# Patient Record
Sex: Female | Born: 1945 | Race: White | Hispanic: No | Marital: Single | Smoking: Never smoker
Health system: Southern US, Community
[De-identification: ages and names within clinical notes are randomized; demographics above are authoritative.]

## PROBLEM LIST (undated history)

## (undated) DIAGNOSIS — E079 Disorder of thyroid, unspecified: Secondary | ICD-10-CM

## (undated) DIAGNOSIS — T7840XA Allergy, unspecified, initial encounter: Secondary | ICD-10-CM

## (undated) DIAGNOSIS — E785 Hyperlipidemia, unspecified: Secondary | ICD-10-CM

## (undated) DIAGNOSIS — E559 Vitamin D deficiency, unspecified: Secondary | ICD-10-CM

## (undated) DIAGNOSIS — I10 Essential (primary) hypertension: Secondary | ICD-10-CM

## (undated) DIAGNOSIS — H269 Unspecified cataract: Secondary | ICD-10-CM

## (undated) DIAGNOSIS — J310 Chronic rhinitis: Secondary | ICD-10-CM

## (undated) DIAGNOSIS — K635 Polyp of colon: Secondary | ICD-10-CM

## (undated) DIAGNOSIS — J45909 Unspecified asthma, uncomplicated: Secondary | ICD-10-CM

## (undated) DIAGNOSIS — N951 Menopausal and female climacteric states: Secondary | ICD-10-CM

## (undated) DIAGNOSIS — M8580 Other specified disorders of bone density and structure, unspecified site: Secondary | ICD-10-CM

## (undated) DIAGNOSIS — K219 Gastro-esophageal reflux disease without esophagitis: Secondary | ICD-10-CM

## (undated) HISTORY — DX: Menopausal and female climacteric states: N95.1

## (undated) HISTORY — DX: Unspecified cataract: H26.9

## (undated) HISTORY — DX: Unspecified asthma, uncomplicated: J45.909

## (undated) HISTORY — PX: COLONOSCOPY: SHX174

## (undated) HISTORY — DX: Disorder of thyroid, unspecified: E07.9

## (undated) HISTORY — DX: Essential (primary) hypertension: I10

## (undated) HISTORY — DX: Chronic rhinitis: J31.0

## (undated) HISTORY — DX: Vitamin D deficiency, unspecified: E55.9

## (undated) HISTORY — DX: Other specified disorders of bone density and structure, unspecified site: M85.80

## (undated) HISTORY — DX: Polyp of colon: K63.5

## (undated) HISTORY — DX: Hyperlipidemia, unspecified: E78.5

## (undated) HISTORY — PX: TONSILLECTOMY: SUR1361

## (undated) HISTORY — DX: Allergy, unspecified, initial encounter: T78.40XA

## (undated) HISTORY — DX: Gastro-esophageal reflux disease without esophagitis: K21.9

---

## 1998-07-18 ENCOUNTER — Other Ambulatory Visit: Admission: RE | Admit: 1998-07-18 | Discharge: 1998-07-18 | Payer: Self-pay | Admitting: Family Medicine

## 2000-07-30 ENCOUNTER — Other Ambulatory Visit: Admission: RE | Admit: 2000-07-30 | Discharge: 2000-07-30 | Payer: Self-pay | Admitting: Family Medicine

## 2001-08-12 ENCOUNTER — Other Ambulatory Visit: Admission: RE | Admit: 2001-08-12 | Discharge: 2001-08-12 | Payer: Self-pay | Admitting: Family Medicine

## 2002-09-06 ENCOUNTER — Other Ambulatory Visit: Admission: RE | Admit: 2002-09-06 | Discharge: 2002-09-06 | Payer: Self-pay | Admitting: Family Medicine

## 2003-08-26 HISTORY — PX: CHOLECYSTECTOMY: SHX55

## 2003-10-12 ENCOUNTER — Other Ambulatory Visit: Admission: RE | Admit: 2003-10-12 | Discharge: 2003-10-12 | Payer: Self-pay | Admitting: Family Medicine

## 2004-04-05 ENCOUNTER — Inpatient Hospital Stay (HOSPITAL_COMMUNITY): Admission: EM | Admit: 2004-04-05 | Discharge: 2004-05-03 | Payer: Self-pay | Admitting: Internal Medicine

## 2004-04-09 ENCOUNTER — Encounter (INDEPENDENT_AMBULATORY_CARE_PROVIDER_SITE_OTHER): Payer: Self-pay | Admitting: Specialist

## 2004-05-14 ENCOUNTER — Ambulatory Visit (HOSPITAL_COMMUNITY): Admission: RE | Admit: 2004-05-14 | Discharge: 2004-05-14 | Payer: Self-pay | Admitting: General Surgery

## 2004-05-27 ENCOUNTER — Ambulatory Visit (HOSPITAL_COMMUNITY): Admission: RE | Admit: 2004-05-27 | Discharge: 2004-05-27 | Payer: Self-pay | Admitting: General Surgery

## 2004-05-30 ENCOUNTER — Ambulatory Visit (HOSPITAL_COMMUNITY): Admission: RE | Admit: 2004-05-30 | Discharge: 2004-05-30 | Payer: Self-pay | Admitting: Internal Medicine

## 2005-04-25 IMAGING — CT CT ABDOMEN W/O CM
1 of 2 series · 15 of 32 positions shown, 19 images · non-contrast
Comparison: None.

CLINICAL DATA: Abdominal pain and gallstones.  Assess for abdominal abscess.
ABDOMEN AND PELVIS CT WITHOUT CONTRAST ? 04/19/04
TECHNIQUE: Contiguous 5 mm axial images were obtained from the lung bases through the pubic symphysis after spiral CT scanning of the abdomen and pelvis.  Intravenous contrast was withheld secondary to the patient?s elevated serum creatinine.

[Series 3: abd/pelvis 5.0 b30f · axial · 0.64mm/px · z∈[+1326,+1750]mm · 15 of 93 slices shown, 19 images]
[im 4/93  soft-tissue]
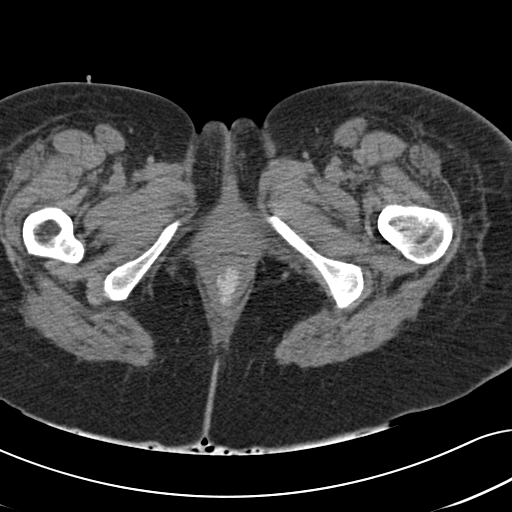
[im 4/93  bone]
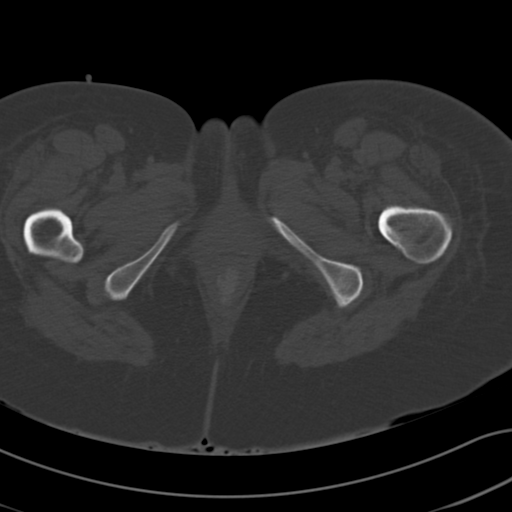
[im 12/93  soft-tissue]
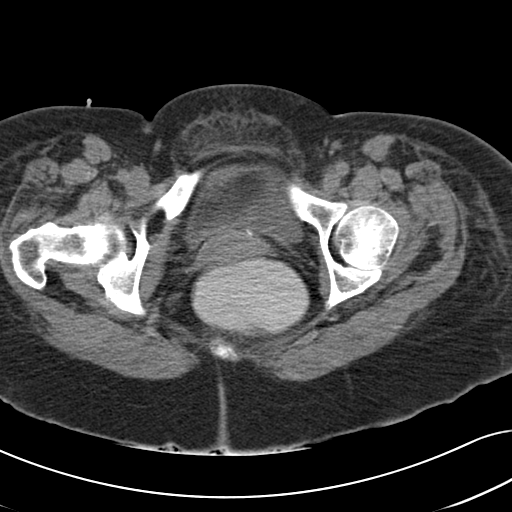
[im 19/93  soft-tissue]
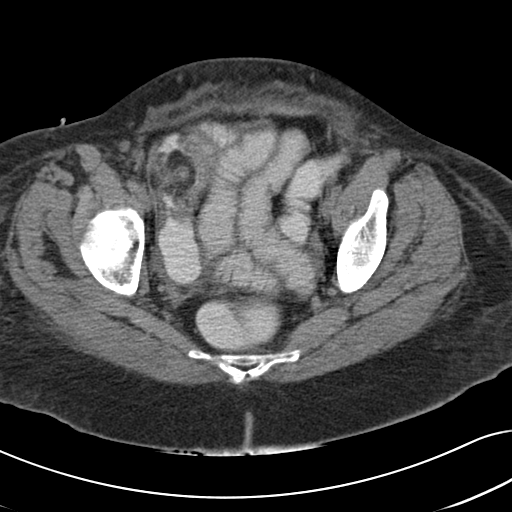
[im 26/93  soft-tissue]
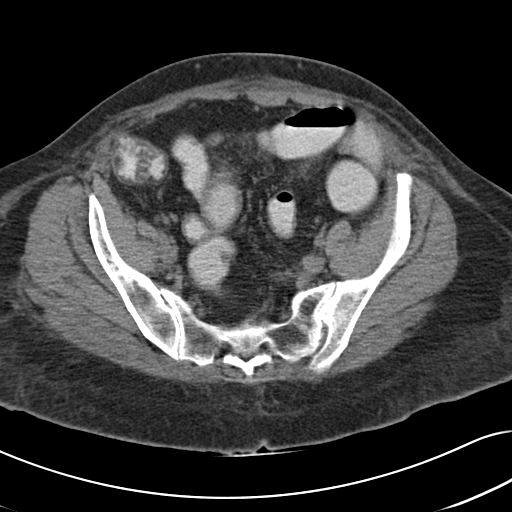
[im 34/93  soft-tissue]
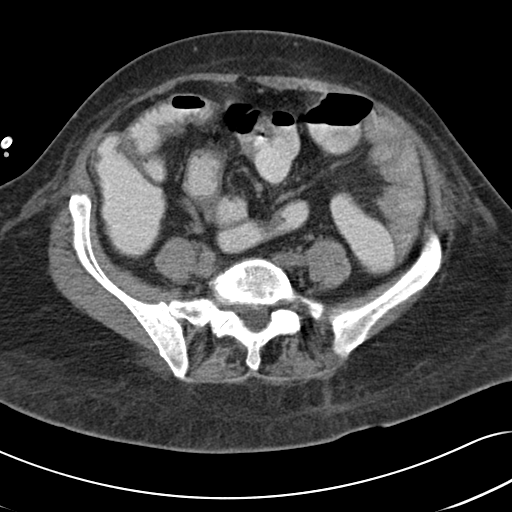
[im 41/93  soft-tissue]
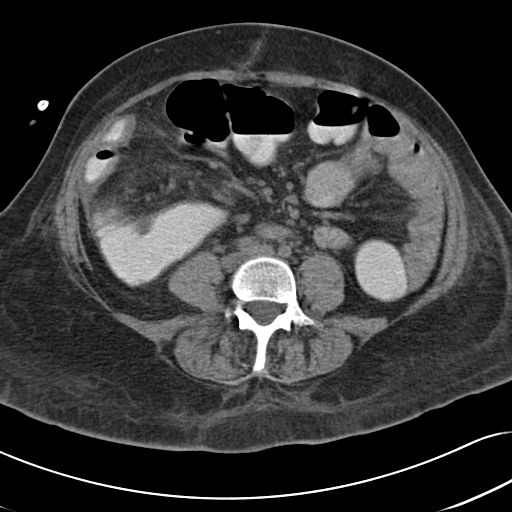
[im 48/93  soft-tissue]
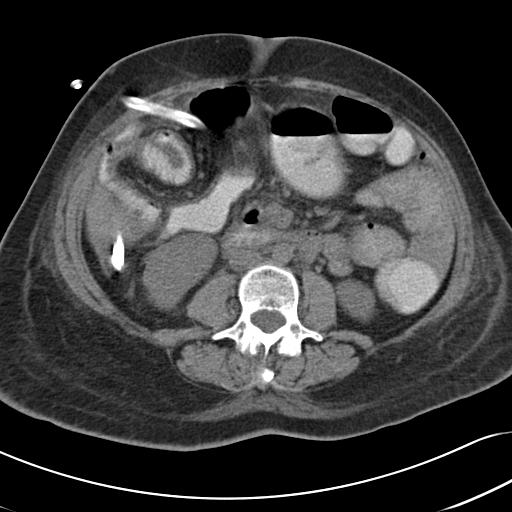
[im 52/93  soft-tissue]
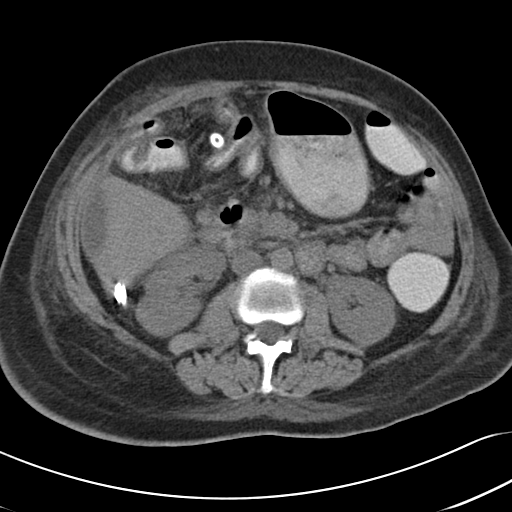
[im 59/93  soft-tissue]
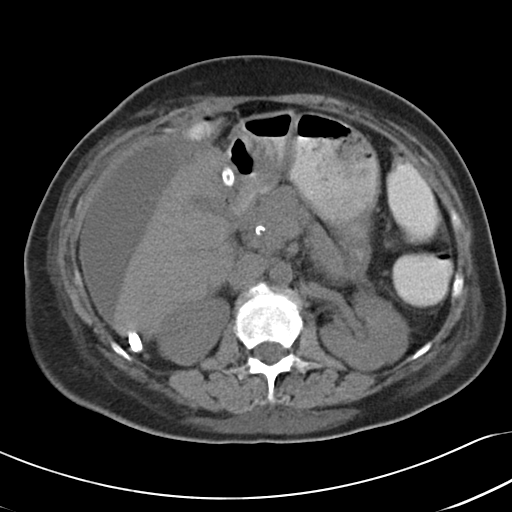
[im 59/93  bone]
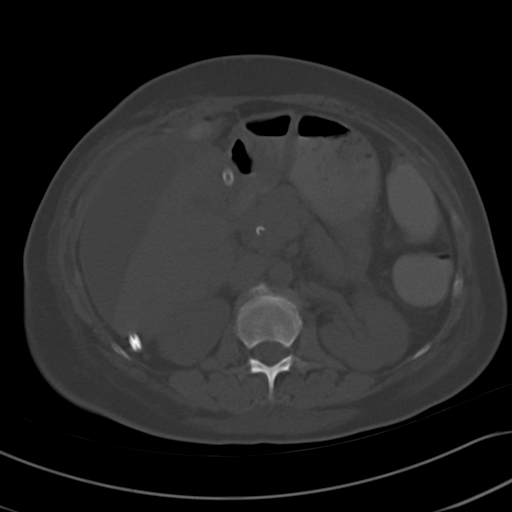
[im 67/93  soft-tissue]
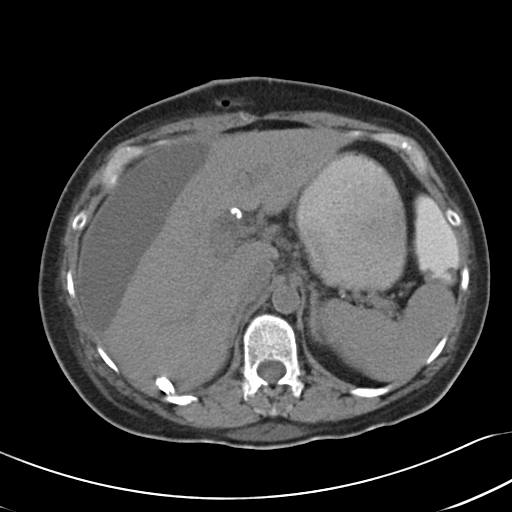
[im 74/93  soft-tissue]
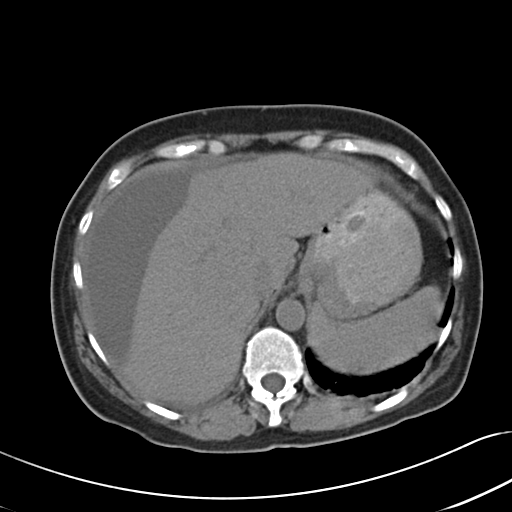
[im 78/93  lung]
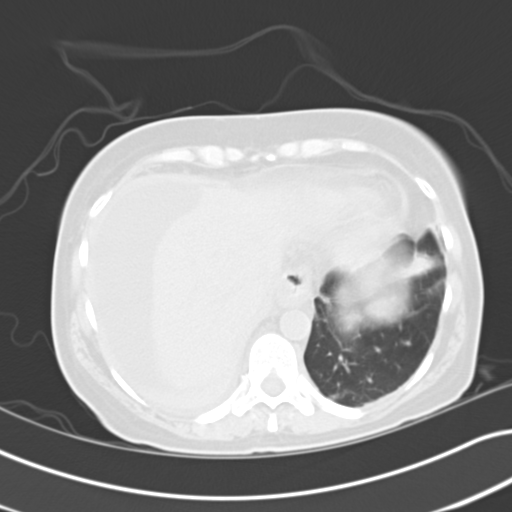
[im 81/93  soft-tissue]
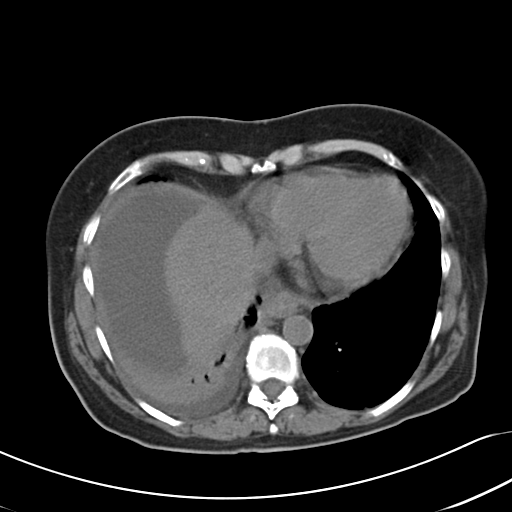
[im 81/93  lung]
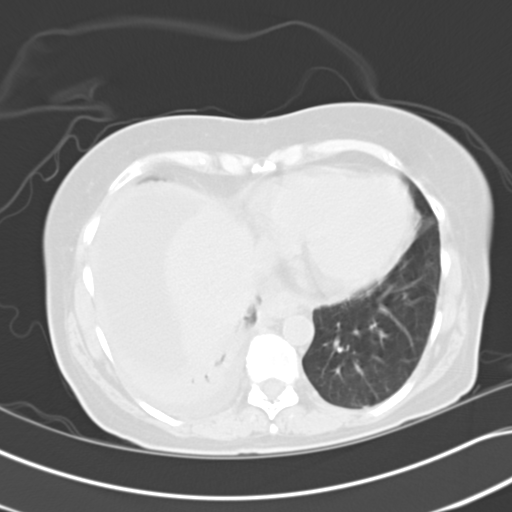
[im 85/93  lung]
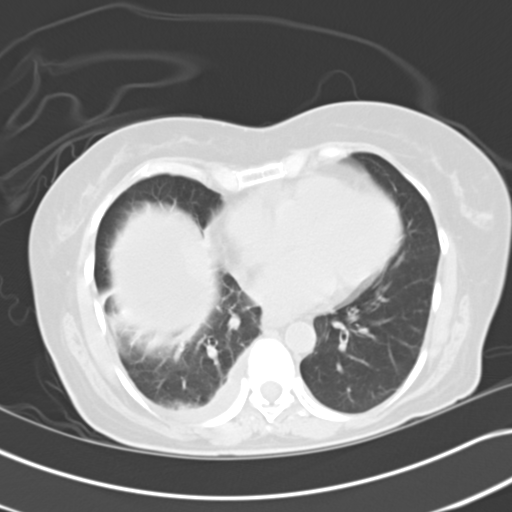
[im 89/93  soft-tissue]
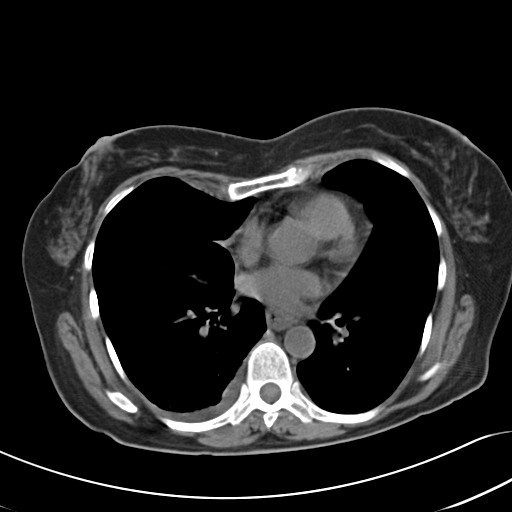
[im 89/93  lung]
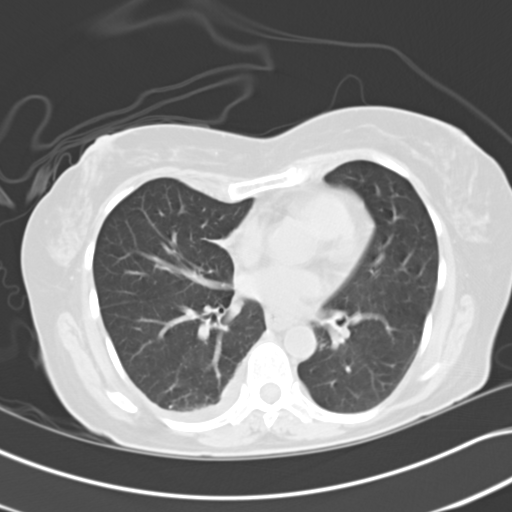

[15 of 32 positions shown; findings below may reference images not displayed]

FINDINGS: ABDOMEN:
Incidental imaging of the lung bases shows compressive atelectasis and a small effusion in the posterior right lung base.
A moderate sized crescent-shaped fluid collection is identified adjacent to the right liver, most likely subcapsular.  This measures approximately 14.0 x 3.7 x 17 cm.  By CT the attenuation is in the 6 to 8 Hounsfield unit range, and there is no heterogeneity to suggest a subcapsular hematoma.  A biloma would be a distinct consideration given the patient?s recent history of open cholecystectomy and operative T-tube placement.  
No focal abnormality is seen in the liver or spleen on this uninfused examination.  The stomach is unremarkable.  A prominent duodenal diverticulum is noted.  The pancreas, adrenal glands, and kidneys have normal uninfused features.  Surgical clips are noted in the gallbladder fossa.  There is no discrete fluid collection in the gallbladder fossa itself.
Small bowel loops are noted cranial to the distal stomach and hepatic flexure and an internal hernia cannot be completely excluded.  There is no free intraperitoneal fluid identified in the abdomen.  A T-tube is noted and a surgical drain courses under the liver and along the posterior margin. 
IMPRESSION
Large subcapsular fluid collection adjacent to the liver worrisome for a biloma.
PELVIS:
Imaging through the anatomic pelvis shows a small amount of free fluid.  There is fatty infiltration of the terminal ileum.  No substantial colonic wall thickening is apparent.  Diffuse fluid throughout the course of the colon suggests diarrhea.  There is no evidence for adnexal masses.
IMPRESSION
Small amount of intraperitoneal fluid.  No acute findings in the anatomic pelvis.

## 2005-04-26 IMAGING — CT CT ABCESS DRAINAGE
1 of 3 series · 15 of 32 positions shown, 19 images · non-contrast
Comparison: none

CLINICAL DATA: 57-year-old female status post open cholecystectomy wit a right hepatic subcapsular fluid collection.    
 CT GUIDED RIGHT SUBCAPSULAR BILOMA DRAINAGE
 GUIDANCE:  CT.
 COMPLICATIONS:  No immediate complications.
 PROCEDURE/FINDINGS:  Informed consent was obtained from the patient.  Non-contrast CT imaging was performed through the lower abdomen.  The subcapsular fluid collection was identified.  From an anterior lateral subcostal approach, an 18 gauge introducer needle was advanced into the subcapsular fluid collection.  There was return of bile.  An Amplatz guide wire was advanced followed by dilatation to except a 10 French pigtail drain.  Drainage catheter position was confirmed with follow-up imaging.  400 cc of bilious fluid was aspirated.  Catheter was secured to the skin with a 0 silk suture.  Dry sterile dressing was applied.  
 There were no immediate complications.  The patient tolerated the procedure well.
 IMPRESSION
 CT guided right subcapsular biloma drainage procedure with insertion of a 10 French pigtail drain.
 PLAN:  
 The patient will remain on IV antibiotics with external drainage.

[Series 3: — · axial · 0.77mm/px · z∈[-227,+8]mm · 15 of 56 slices shown, 19 images]
[im 5/56  soft-tissue]
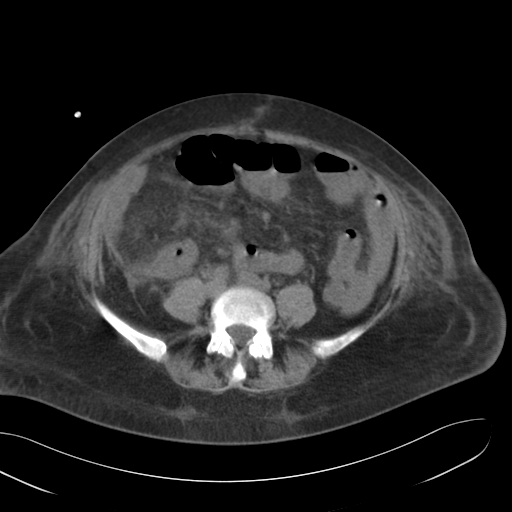
[im 5/56  bone]
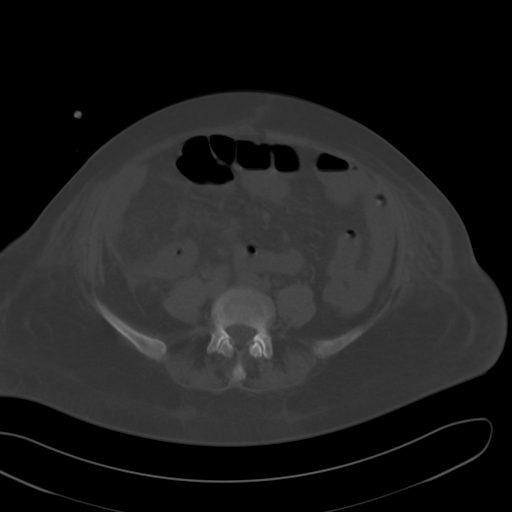
[im 9/56  soft-tissue]
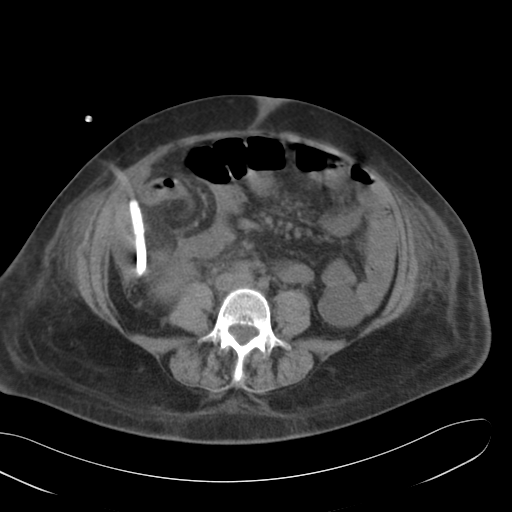
[im 13/56  soft-tissue]
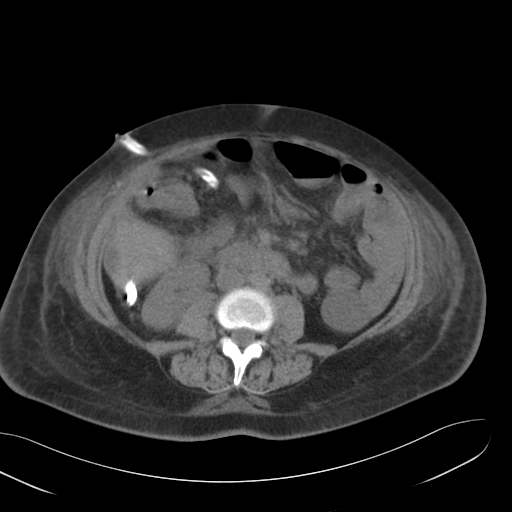
[im 17/56  soft-tissue]
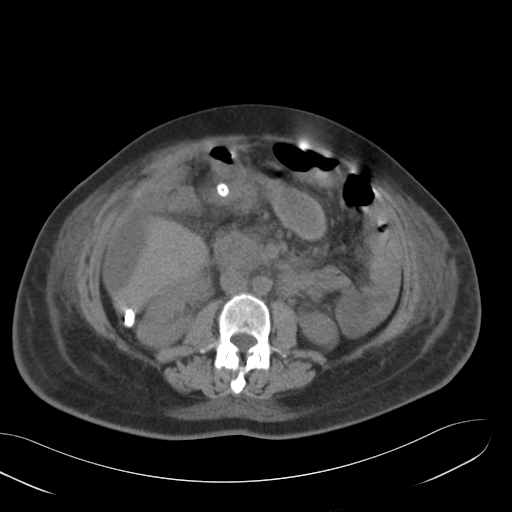
[im 21/56  soft-tissue]
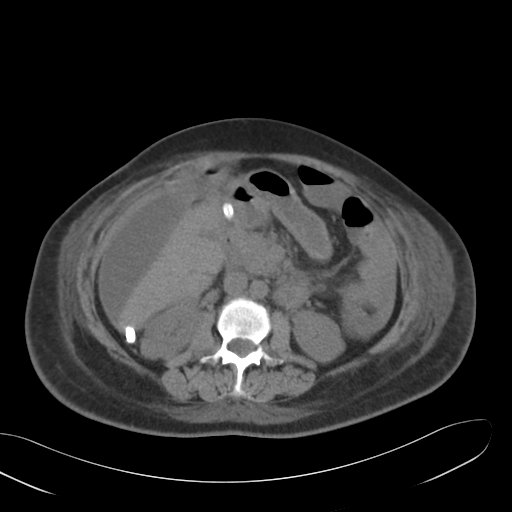
[im 25/56  soft-tissue]
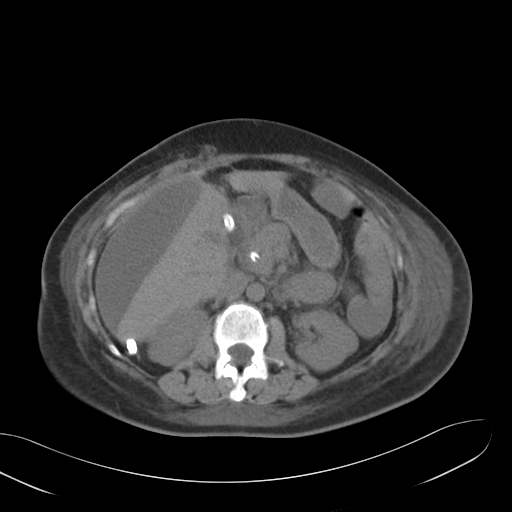
[im 29/56  soft-tissue]
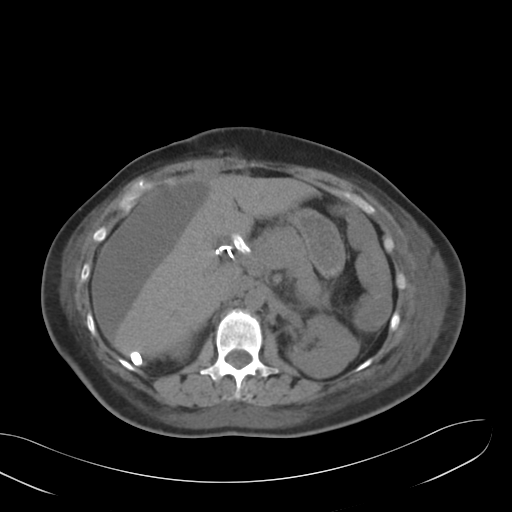
[im 33/56  soft-tissue]
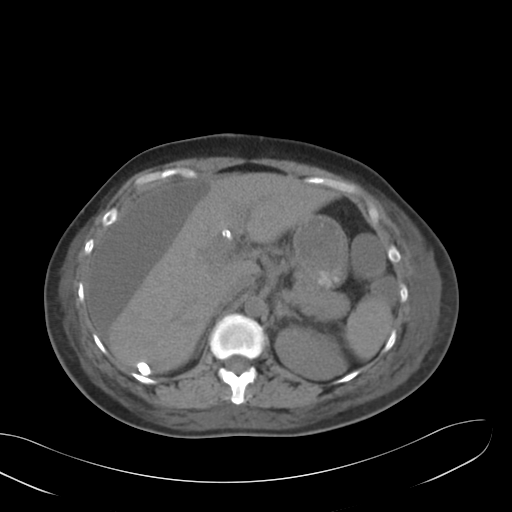
[im 37/56  soft-tissue]
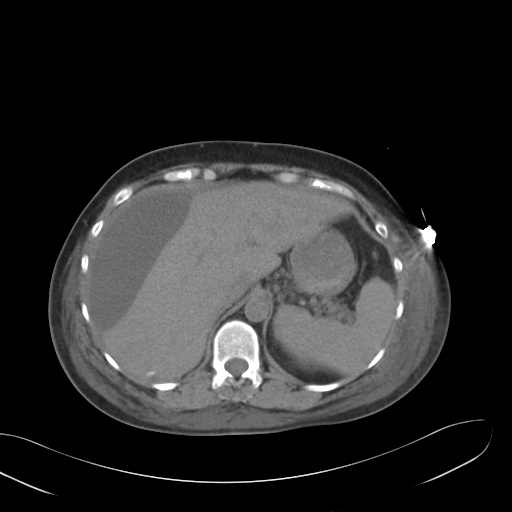
[im 37/56  bone]
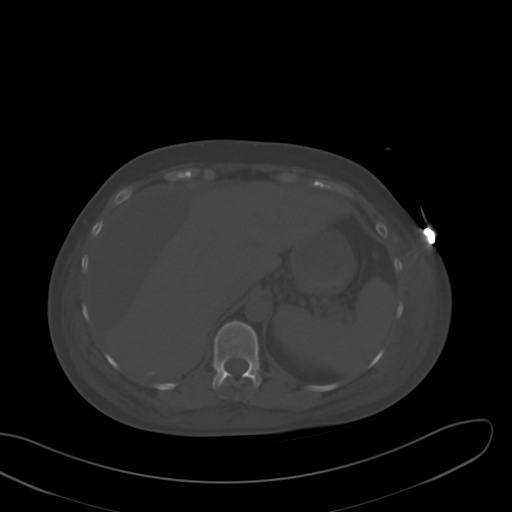
[im 41/56  soft-tissue]
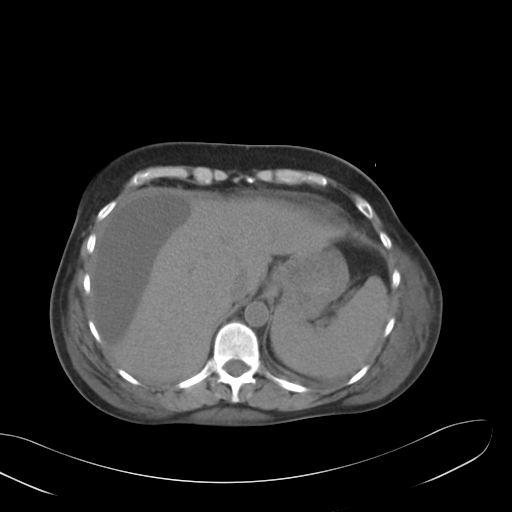
[im 45/56  soft-tissue]
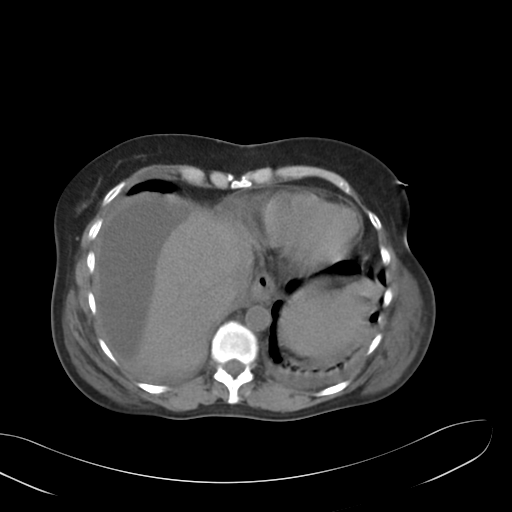
[im 47/56  lung]
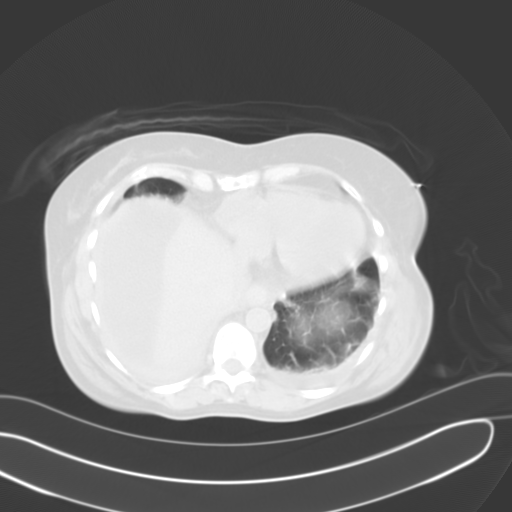
[im 49/56  soft-tissue]
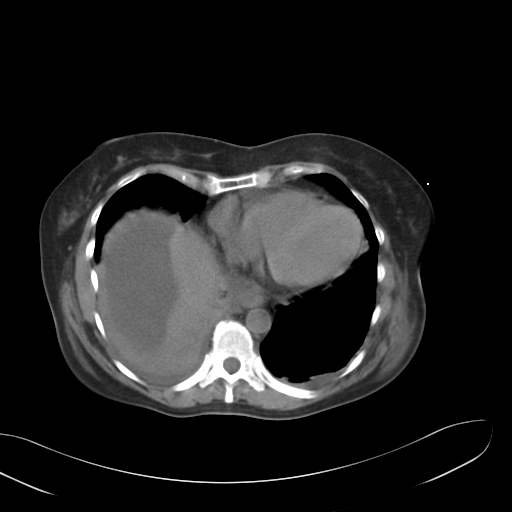
[im 49/56  lung]
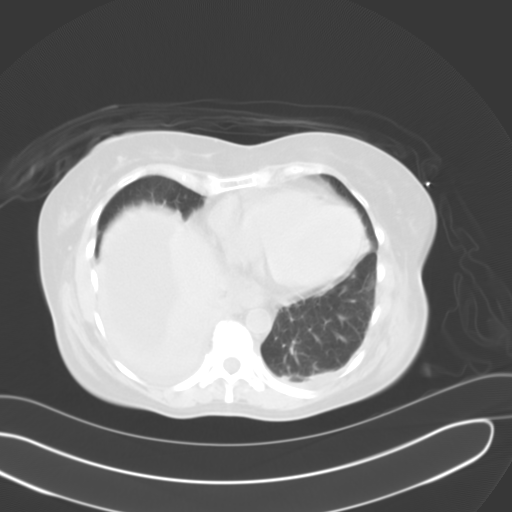
[im 51/56  lung]
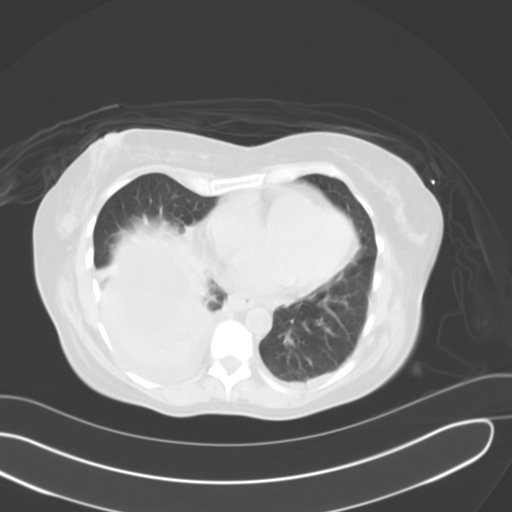
[im 53/56  soft-tissue]
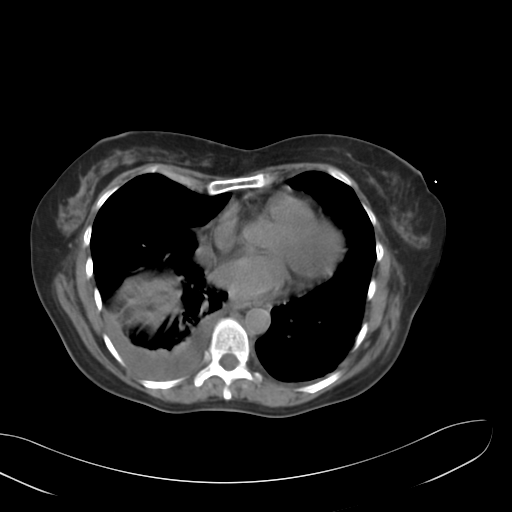
[im 53/56  lung]
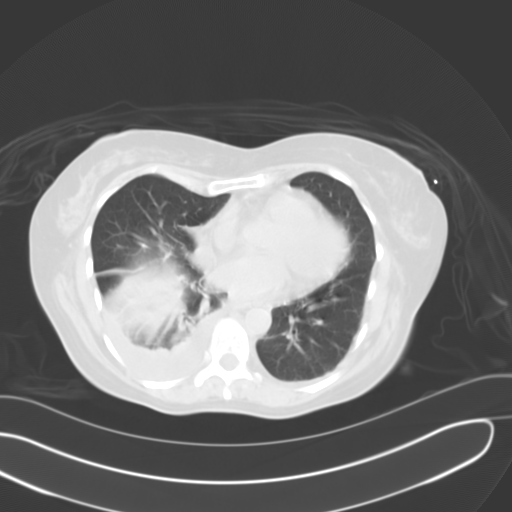

[15 of 32 positions shown; findings below may reference images not displayed]

## 2006-04-17 ENCOUNTER — Emergency Department (HOSPITAL_COMMUNITY): Admission: EM | Admit: 2006-04-17 | Discharge: 2006-04-17 | Payer: Self-pay | Admitting: Emergency Medicine

## 2007-10-07 ENCOUNTER — Ambulatory Visit: Payer: Self-pay | Admitting: Internal Medicine

## 2007-10-21 ENCOUNTER — Ambulatory Visit: Payer: Self-pay | Admitting: Internal Medicine

## 2007-10-21 ENCOUNTER — Encounter: Payer: Self-pay | Admitting: Internal Medicine

## 2010-09-14 ENCOUNTER — Encounter: Payer: Self-pay | Admitting: General Surgery

## 2011-01-10 NOTE — Op Note (Signed)
[name], [name]                           ACCOUNT NO.:  [id number]   MEDICAL RECORD NO.:  [id number]                   PATIENT TYPE:  INP   LOCATION:  0457                                 FACILITY:  [facility]   PHYSICIAN:  Adolph Pollack, M.D.            DATE OF BIRTH:  23-Sep-1945   DATE OF PROCEDURE:  04/09/2004  DATE OF DISCHARGE:                                 OPERATIVE REPORT   PREOPERATIVE DIAGNOSIS:  Cholelithiasis, choledocholithiasis.   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   PROCEDURE:  Laparoscopic cholecystectomy with intraoperative cholangiogram,  open common bowel exploration.   SURGEON:  Adolph Pollack, M.D.   ASSISTANT:  Thornton Park. Daphine Deutscher, M.D.   ANESTHESIA:  General.   DRAINS:  T-tube, 19 Blake.   INDICATIONS:  This is a 65 year old female who was admitted on April 05, 2004 with jaundice, elevated liver function tests, cholelithiasis, and  choledocholithiasis.  She has had two attempted ERCPs, but the common bile  duct was not able to be cannulated.  She now presents for cholecystectomy  and likely open common bile exploration.   The procedure and the risks were discussed with her preoperatively.  The  risks include but are not limited to bleeding, infection, risk of  anesthesia, __________ to the liver, small bowel, bile duct, and post  cholecystectomy diarrhea.  We also discussed the need for potentially long-  term having tubes in for a long time.   TECHNIQUE:  She is seen in the holding area and brought to the operating  room.  Placed supine on the operating room table.  General anesthetic was  administered.  The abdomen was sterilely prepped and draped.  Dilute  Marcaine solution was infiltrated in the subumbilical region, and a small  subumbilical incision was made, incising the skin, subcutaneous tissue, and  fascia sharply.  The peritoneum was incised sharply.  The peritoneal  cavity  entered under direct vision.  A purse-string suture of 0 Vicryl was placed  around the fascial edges.  A Hasson trocar was introduced into the  peritoneal cavity, and pneumoperitoneum was created by insufflation of CO2  gas.   Next, a laparoscope was introduced, and she was placed in reverse  Trendelenburg position with the right side tilted slightly up.  An 11 mm  trocar was placed to an epigastric incision, and two 5 mm trocars were  placed in the right mid abdomen.  The fundus of the gallbladder was grasped,  and the gallbladder was noted to be somewhat distended but soft.  There is a  little bit of edema around it.  The fundus was retracted towards the right  shoulder.  The duodenum demonstrated adhesions between it and the  infundibulum, and these were taken down sharply.  I then mobilized the  infundibulum using blunt dissection and __________ cautery.  A dilated  cystic duct could be noted.  I went ahead and isolated the cystic duct and  created a window around it.  I then placed a clip at the cystic  duct/gallbladder junction and made an incision into the cystic duct.  There  was a stone in the cystic duct, which I milked out and evacuated from the  peritoneal cavity.   A cholangiocatheter was then passed through the anterior abdominal wall and  placed into the cystic duct, and a cholangiogram was performed.  Under real-  time fluoroscopy, dilute contrast material was injected into the cystic  duct.  The common hepatic and right and left hepatic ducts were visualized.  The common bile duct was visualized, and two filling defects were noted.  This was not completely obstructing, as contrast was able to drain into the  duodenum.   Using a Reddick catheter and the balloon device, I attempted balloon  sweeping of the common bile duct but was not able to extract the stones, as  they were too large to come out of the cystic duct.  I repeated the  cholangiogram after this,  and the stones remained.   I discussed this with Dr. Yancey Flemings and felt we had two options.  Option #1  would be to leave a wire in the duodenum and attempt ERCP that way, or  perform open common bile duct exploration.  We agreed common bile duct  exploration would probably be best for her situation.   At this point, I removed all of the trocars, and I closed the subumbilical  fascial defect by tightening and tying down the purse-string suture.  I then  used the epigastric incision and made a right upper quadrant incision  through the skin, subcutaneous tissue, anterior rectus sheath, rectus  muscle, posterior rectus sheath, and peritoneum, entering the peritoneal  cavity.  I gained exposure with retraction.  I then grasped the fundus of  the gallbladder and dissected the gallbladder free from the liver from the  fundus down.  The cystic artery was identified, clipped, and divided.  I  then removed the cholangiocatheter and clamped the cystic duct and divided  the rest very sharply, handing the gallbladder off the field.  I then  ligated the cystic duct with a 2-0 Vicryl tie and a clip.   Following this, I dissected out the common bile duct.  I placed two stay  sutures of 4-0 silk laterally in the bile duct.  I then made a  choledochotomy sharply and extended this with Potts scissors.  I extracted  both of the stones I saw on the cholangiogram and palpated the bile duct and  felt there was no other stones.  I then flushed both proximally and  distally, and no significant debris was noted.   At this time, I brought in a size 16 T-tube and cut the back wall off the T  portion.  I then placed a stent to the common bile duct distally and the  hepatic duct proximally.  I closed the common bile duct of the T-tube with  interrupted 4-0 PDS sutures and removed a stay suture.   I subsequently performed a cholangiogram through the T-tube and noted top filling of the common hepatic, right and  left hepatic, and common bile ducts  and drainage of the contrast into the duodenum.  I did not notice any other  obvious filling defects at this time.   Following this, I irrigated out the gallbladder fossa and noted that  hemostasis was adequate.  The lateral 5 mm trocar site was used to place a  drain into the gallbladder fossa and exit out this site, and it was anchored  to the skin with a 3-0 nylon suture.  The medial 5 mm trocar site was used  to bring the T-tube out and externalize it.  It was anchored to the skin  with a 3-0 nylon suture.  I later evacuated the irrigation fluid.  Needle,  sponge, and instrument counts were reported to be correct at this time.   I then closed the fascia with two layers, closing the posterior sheath with  a running #1 PDS suture and an anterior sheath with a running #1 PDS suture.  Subcutaneous tissue was irrigated.  The right upper quadrant incision and  subumbilical incision were closed with staples, and sterile dressings were  applied. She tolerated the procedure well without any apparent  complications.  She was subsequently taken to the recovery room in  satisfactory condition.                                               Adolph Pollack, M.D.    Kari Baars  D:  04/09/2004  T:  04/09/2004  Job:  [id number]   cc:   Iva Boop, M.D. [facility]   Magnus Sinning. Dimple Casey, M.D.  [address] [place]  [place] [address]  Fax: [phone]

## 2011-01-10 NOTE — Op Note (Signed)
NAMELYSETTE, LINDENBAUM                           ACCOUNT NO.:  [id number]   MEDICAL RECORD NO.:  [id number]                   PATIENT TYPE:  INP   LOCATION:  0457                                 [facility]ACILITY[facility]  [facility]   PHYSICIAN:  Lina Sar, M.D. [facility]               DATE OF BIRTH:  November 26, 1945   DATE OF PROCEDURE:  04/06/2004  DATE OF DISCHARGE:                                 OPERATIVE REPORT   PROCEDURE:  ERCP.   INDICATIONS:  This 65 year old __________ female was admitted with jaundice  and intermittent upper abdominal pain of several weeks' duration, bilirubin  of 2.2, with elevated transaminases and alkaline phosphatase.  On  gallbladder ultrasound, she was found to have cholelithiasis with dilated  common bile duct to 11.2 mm.  There were also some stones in the common bile  duct.  Surgical consultation has been requested.  ERCP is attempted to  proceed with sphincterotomy and common bile duct stone extraction.   ENDOSCOPE:  Olympus single-channel side-viewing duodenoscope.   SEDATION:  1. Versed 17 mg IV.  2. Fentanyl 187.5 mcg IV.  3. Glucagon 0.75 mg IV.   FINDINGS:  Olympus single channel side-viewing duodenoscope passed blindly  through the esophagus into the stomach and through the pylorus into the  duodenum.  Papilla was identified and appeared normal.  There was no  evidence of distortion.  Bile was exiting from the papilla.  We were able to  cannulate the papilla but were unable to obtain a deep cannulation.  Only  main pancreatic duct filled, but the common bile duct would not fill,  although we were able to pass the guidewire all the way up to the hepatic  duct.  It was as if there was an obstruction at the distal common bile duct.  Multiple attempts to cannulate were unsuccessful despite having the  guidewire in position.  Because of the inability to obtain deep cannulation,  we were unable to proceed with sphincterotomy.  Fluoroscopy was provided  during the  procedure.  The patient tolerated the procedure well.  The  procedure was terminated after an unsuccessful attempt to cannulate the  common bile duct.   IMPRESSION:  1. No visualization of the common bile duct.  2. Normal pancreatic duct.  3. Normal-appearing papilla.   PLAN:  1. We will obtain HIDA scan today to assess the patient's common bile duct.  2. The patient will need either repeat attempt for ERCP or an open     cholecystectomy with common bile duct exploration, depending on her     course in the next 24 hours.  Repeat liver function tests as well as     results of the HIDA scan.  I have discussed this with Dr. Jamey Ripa.  Lina Sar, M.D. [facility]    DB/MEDQ  D:  04/06/2004  T:  04/07/2004  Job:  [id number]   cc:   Magnus Sinning. Dimple Casey, M.D.  [address] [place]  [place] [address]  Fax: [phone]   Alfredia Client, MD   Currie Paris, M.D.  [address]  [place]  [place] [address]  Fax: [phone]

## 2011-01-10 NOTE — H&P (Signed)
NAMECYRIL, [name]                           ACCOUNT NO.:  [id number]   MEDICAL RECORD NO.:  [id number]                   PATIENT TYPE:  INP   LOCATION:  0457                                 FACILITY:  [facility]   PHYSICIAN:  Iva Boop, M.D. [facility]           DATE OF BIRTH:  07/18/1946   DATE OF ADMISSION:  04/05/2004  DATE OF DISCHARGE:                                HISTORY & PHYSICAL   CHIEF COMPLAINT:  Obstructive jaundice/cholelithiasis.   HISTORY:  [name] was seen by me earlier today as a consultation in the  office.  This dictation will serve as her consultation note as well.  She is  a 65 year old white female that has had three months of intermittent  epigastric pain and upper abdominal pain that has worsened, it has been  quite severe on a daily basis now.  She has developed jaundice.  She was  seen at [facility] by Dr. Molly Maduro Day and Dr. Dimple Casey,  and was found to have a bilirubin of 2.2, alkaline phosphatase 196, AST 701,  ALT 369, on April 03, 2004.  She has vomited a few times.  She denies fever  or bowel habit changes.  Other complaints are a burning epigastric pain.  She feels gassy.  She has had some vague choking in her throat, but no clear-  cut dysphagia.  She has never had problems like this before.  There is no  pruritis, though she had some sort of medication, she is not clear of which,  that caused a rash recently.  That has resolved.  She feels like she has  gained a little bit of weight.  Her GI review of systems is otherwise  negative.   MEDICATIONS:  1. Contracal one b.i.d.  2. Advair 100/50 one puff daily.  3. Eye drops.  _____________ each eye t.i.d. to q.i.d.  4. Nasacort a.c. spray daily.  5. Fosamax 70 mg every week.  6. Nexium 40 mg daily.  7. Multivitamin daily.  8. Synthroid 75 mcg daily.  9. Refresh ointment to both eyes q.h.s.   ALLERGIES:  SULFA.   P.R.N. MEDICATIONS:  1. Allegra.  2. Albuterol inhaler.   PAST MEDICAL HISTORY:  1. Asthma.  2. TMJ surgery.  3. Chronic allergy and sinus problems.  4. Hypothyroidism.  5. Glaucoma, presumed.   FAMILY HISTORY:  Otherwise negative.   SOCIAL HISTORY:  She is single.  Her mother lives with her.  Currently  unemployed.  No alcohol, tobacco, or drugs.   REVIEW OF SYSTEMS:  Allergies and some back pain, otherwise negative.  All  other systems otherwise negative.   PHYSICAL EXAMINATION:  GENERAL:  A well-developed, well-nourished, middle-  aged, white female.  VITAL SIGNS:  Height 5 feet 3 inches, weight 137 pounds, blood pressure  120/70, pulse 84.  HEENT:  The eyes show scleral icterus.  The mouth and posterior pharynx show  icterus of the palate, otherwise free of lesions.  NECK:  Supple without thyromegaly or masses.  CHEST:  Clear.  HEART:  S1 and S2.  No murmurs, rubs, or gallops.  ABDOMEN:  Mildly tender in the epigastrium without mass.  RECTAL:  Deferred.  LYMPHATIC:  No neck or supraclavicular nodes.  EXTREMITIES:  No peripheral edema, cyanosis, or clubbing.  SKIN:  She is mildly jaundiced.  There is no rash.  NEUROLOGIC:  She is alert and oriented x3.  Cranial nerves II-XII intact.   LABORATORY DATA:  Subsequent to the evaluation in the office, an ultrasound  was ordered which shows cholelithiasis with numerous stones in the  gallbladder and diffuse extra and intra-hepatic biliary dilation with a  maximal diameter of the common duct of 11.9 mm.  This preliminary report is  reviewed by me personally.  Other labs show an alkaline phosphatase of 210,  ALT 328, AST 161, bilirubin 4.8.  CBC demonstrates a hemoglobin of 14.1, MCV  91, platelet count 375, white count 6.7.   Amylase and lipase are normal at 42 and 21, respectively.   ASSESSMENT:  Choledocholithiasis (presumed) with cholelithiasis.  She does  not appear to have acute cholecystitis.  She is fairly ill from this and the  potential for serious problems exists in this  setting.   PLAN:  Admit to the hospital with plans for surgical consultation regarding  her cholelithiasis and choledocholithiasis.  Endoscopic retrograde  cholangiopancreatography will be planned for tomorrow to evaluate the common  duct and remove any stones that may be there, prior to any operation.  This  has been explained to the patient and she understands and agrees with the  plan.  Further plans pending clinical course.   I appreciate the opportunity to care for this patient.                                               Iva Boop, M.D. [facility]    CEG/MEDQ  D:  04/05/2004  T:  04/05/2004  Job:  [id number]   cc:   Dr. Molly Maduro Day   Magnus Sinning. Dimple Casey, M.D.  [address] [place]  [place] [address]  Fax: [phone]

## 2011-01-10 NOTE — Discharge Summary (Signed)
[name], [name]                 ACCOUNT NO.:  [id number]   MEDICAL RECORD NO.:  [id number]          PATIENT TYPE:  INP   LOCATION:  0457                         FACILITY:  [facility]   PHYSICIAN:  Adolph Pollack, M.D.DATE OF BIRTH:  [birth year]   DATE OF ADMISSION:  04/05/2004  DATE OF DISCHARGE:  05/03/2004                                 DISCHARGE SUMMARY   PRINCIPAL DISCHARGE DIAGNOSIS:  Obstructive jaundice secondary to  choledocholithiasis .   SECONDARY DIAGNOSES:  1.  Hypothyroidism.  2.  Asthma.  3.  Postoperative abscess (infected biloma).  4.  Severe malnutrition.  5.  Hypokalemia.  6.  Postoperative ileus.  7.  Diarrhea.  8.  Acute renal insufficiency.  9.  Hyponatremia.  10. Hypocalcemia.  11. Anemia.  12. Pleural effusions, right side greater than left side.  13. Deconditioned state.   REASON FOR ADMISSION:  [name] is a 65 year old female admitted by Dr.  Stan Head.  She had painful jaundice.  She was subsequently seen in  consult and found to have gallstones.  She subsequently was admitted and  underwent an ERCP on April 06, 2004.  The common bile duct was not able to  be cannulated.  A repeat ERCP was planned.  This was also unsuccessful.  Subsequently she had planned to have the surgery on August 16.  At that time  she underwent a laparoscopic cholecystectomy with intraoperative  cholangiogram and then open common bile duct exploration.  A T-tube was  placed in the common bile duct and a drain.  Postoperatively she had some  biliary fluid draining from her Jackson-Pratt site.  The T-tube was patent.  She was re-started on inhalers for her asthma.  She did have a mild  postoperative ileus that slowly improved.  She had some hypokalemia and  began having increasing T-tube output leading to some renal insufficiency  from her prerenal phenomenon.  She was given volume replacement and her  kidney function slowly improved.   She began having some diarrhea  and a stool for C. difficile was negative.  I  went ahead and clamped her T-tube and she underwent a CT scan which  demonstrated right suprahepatic fluid collection.  Radiologic drainage was  performed and a biloma was drained.  The T-tube was then reopened.  The  drain was sent for culture and was found to be a sensitive staph aureus  organism and she was started on antibiotics appropriate for this.   Of note, she came in malnourished and had severe malnutrition while in the  hospital.  A PIC line was placed and she was started on TNA as she was not  able to take much oral intake. She remained on broad spectrum antibiotics of  vancomycin and ciprofloxacin at this time and nutritional support was  continued.  The biloma drain output slowly started to decline.  A T-tube  cholangiogram was performed and there was a small leak noted which was  connecting with the biloma area.  Thus we left the drain in and the T-tube  unclamped.  She had some bilateral  lower extremity swelling and a lower  extremity duplex was negative for a DVT.  Her antibiotic was subsequently  switched to Ancef when the sensitivities of the staph aureus came back.  Questran was started for her diarrhea and she improved from that.  A repeat  CT scan was performed and minimal fluid was noted where the previous biloma  was.  A T-tube cholangiogram showed decreased leaking from around the T-tube  site.  She began to take an oral diet better. It was noted she had a little  shortness of breath with walking and she had a significant pleural effusion,  right greater than left side.  A radiologic guided drainage was performed  with 1000 cc and this was sterile.  She continued to improve and I switched  her to oral Keflex.  By May 03, 2004 she was feeling much better, her  edema had essentially resolved.  She did have somewhat of a leukocytosis,  otherwise stable at about 12 or 13,000.  She symptomatically was improved  from a  pulmonary stand-point and was ready for discharge.   DISPOSITION:  Discharged to home in satisfactory condition on May 03, 2004.  She was taught how to do drain care and empty the T-tube.  She was  given Keflex, Questran for diarrhea, iron and was to continue her home  medicines.  She was going to come back and see me in 4 to 7 days in the  office for follow up.      TJR/MEDQ  D:  05/20/2004  T:  05/20/2004  Job:  [id number]   cc:   Magnus Sinning. Dimple Casey, M.D.  [address] [place]  [place] [address]  Fax: [phone]

## 2011-01-10 NOTE — Op Note (Signed)
NAMEANNALESE, [name]                           ACCOUNT NO.:  [id number]   MEDICAL RECORD NO.:  [id number]                   PATIENT TYPE:  INP   LOCATION:  0457                                 FACILITY:  [facility] [place]   PHYSICIAN:  Wilhemina Bonito. Marina Goodell, M.D. [facility]             DATE OF BIRTH:  08-07-1946   DATE OF PROCEDURE:  04/08/2004  DATE OF DISCHARGE:                                 OPERATIVE REPORT   PROCEDURE:  Endoscopic retrograde cholangiopancreatography.   INDICATION:  Abdominal pain, elevated liver function tests, and  cholelithiasis.   HISTORY:  This is a 65 year old female with hypothyroidism and asthma who  has had intermittent episodic upper abdominal pain for which she saw Dr.  Leone Payor.  She was noted as having abnormal liver function tests.  Abdominal  ultrasound revealed gallstones and dilated common bile duct with  questionable choledocholithiasis.  She was admitted to the hospital with  pain.  She underwent ERCP, April 06, 2004, with Dr. Lina Sar.  Common  bile duct opacification was not achieved.  She was set up today for repeat  procedure.  The patient's pain has resolved and liver tests improved.  The  nature of the procedure as well as the risks, benefits, and alternatives  have been reviewed.  She understood and agreed to proceed.   PHYSICAL EXAMINATION:  GENERAL:  Well-appearing female in no acute distress.  She is alert and oriented.  VITAL SIGNS:  Stable.  LUNGS:  Clear.  HEART:  Regular.  ABDOMEN:  Soft without significant tenderness.   DESCRIPTION OF PROCEDURE:  After informed consent was obtained, the patient  was sedated over the course of the procedure with 100 mg of Demerol and 17  mg of Versed IV.  The Olympus side-viewing endoscope was passed blindly into  the esophagus.  The stomach, duodenal bulb, and postbulbar duodenum were  normal.  The major ampulla was somewhat bulbous and mildly edematous, though  otherwise unremarkable.  The minor ampulla was not  sought.   X-RAY FINDINGS:  1. Scout radiograph of the abdomen with the endoscope in position was     unremarkable.  2. Initial injection of contrast via the major ampulla yielded partial     pancreatogram.  This was unremarkable.  3. Multiple attempts were made to cannulate the common bile duct.  These     were unsuccessful despite using several diameter catheters as well as     several diameter hydrophilic guidewires (0.035 and 0.025).  After     approximately 1 hour and 50 minutes, it was decided to not proceed     further.   IMPRESSION:  1. Failed common duct cannulation x 2.  2. Symptomatic cholelithiasis with possible choledocholithiasis.   RECOMMENDATIONS:  I discussed the case with Dr. Jamey Ripa.  The plans are for a  cholecystectomy tomorrow or the following day with possible common duct  exploration if needed.  Wilhemina Bonito. Marina Goodell, M.D. [facility]    JNP/MEDQ  D:  04/08/2004  T:  04/08/2004  Job:  [id number]   cc:   Alfredia Client, MD   Magnus Sinning. Dimple Casey, M.D.  [address] [place]  [place] [address]  Fax: [phone]   Iva Boop, M.D. [facility]   Currie Paris, M.D.  [address], [address]  [place]  [place] [address]  Fax: [phone]

## 2011-01-10 NOTE — Consult Note (Signed)
[name], [name]                           ACCOUNT NO.:  [id number]   MEDICAL RECORD NO.:  [id number]                   PATIENT TYPE:  INP   LOCATION:  0457                                 FACILITY:  [facility]   PHYSICIAN:  Currie Paris, M.D.           DATE OF BIRTH:  [birth year]   DATE OF CONSULTATION:  04/06/2004  DATE OF DISCHARGE:                                   CONSULTATION   REASON FOR CONSULTATION:  Gallstones.   HISTORY OF PRESENT ILLNESS:  I was asked by Dr. Juanda [name] to see [name].  She is a generally healthy 65 year old lady who presented and was admitted  yesterday with a three month history of abdominal pain.  She has had primary  epigastric and right upper quadrant becoming almost daily at the time she  was admitted.  She has also developed some jaundice.  According to notes in  the chart, she had outpatient labs with a bilirubin of 2.2, elevated  alkaline phosphatase, AST, ALT done a couple of days earlier.  She was  admitted and had attempted ERCP today, but unable to extract stone.  She was  seen after that for consideration of possible cholecystectomy.   The patient notes that these symptoms that she has had have been only over  the last several months.  She has otherwise been in good health.   MEDICATIONS:  1. Advair.  2. Some eye drops.  3. Fosamax.  4. Nexium.  5. Multivitamins.  6. Synthroid.   ALLERGIES:  SULFA.   PAST MEDICAL HISTORY:  1. TMJ surgery.  2. Hypothyroidism.   FAMILY HISTORY:  Unremarkable.   SOCIAL HISTORY:  She is single.  Does not smoke or drink.   PHYSICAL EXAMINATION:  GENERAL:  The patient is a well-developed,  ____________looking lady who is a little drowsy from her recent ERCP, but  responsive.  She denies any current abdominal pain.  HEENT:  Head is normocephalic.  Eyes as noted icteric.  Pupils equal and  regular.  Pharynx is normal.  NECK:  Supple, no masses or thyromegaly.  LUNGS:  Normal respirations.  Clear  to auscultation.  HEART:  Regular rate and rhythm, no murmurs, rubs, or gallops.  ABDOMEN:  Slightly distended, soft, nontender, no masses, no organomegaly.  EXTREMITIES:  No cyanosis or edema, no significant varicosities.  NEUROLOGIC:  Grossly intact.   LABORATORY DATA:  Data reviewed:  All I have in the way of lab data is notes  in the chart.  There is none in the computer, so apparently none has been  done here yet.  I have also spoken with Dr. Juanda [name], and she was able to get  apparently a _____________input, I could never really get it open enough to  do sphincterotomy with stone extraction.   IMPRESSION:  1. Cholelithiasis with obstructive jaundice, probably secondary to common     bile duct stone.  2. History of  asthma.   PLAN:  She is already scheduled for some additional labs today as well as  hepatobiliary scan.  She will need a laparoscopic cholecystectomy with  cholangiogram, although I think it would be helpful if we give one more  attempt to get a stone extraction with an endoscopic retrograde  cholangiopancreatography prior to surgical intervention.  We will not have  the technology available on the weekend to try to do a laparoscopic common  bile duct exploration, and she may well be converted to open if she has  residual common duct stones and cannot have an endoscopic retrograde  cholangiopancreatography.  To avoid an open procedure, I think it would be  appropriate to attempt one more time an endoscopic retrograde  cholangiopancreatography with stone extraction.  In addition, we could then  during the week have all the appropriate equipment available for a  laparoscopic common duct exploration if we are unable to treat the stone  with endoscopic retrograde cholangiopancreatography.                                               Currie Paris, M.D.    CJS/MEDQ  D:  04/06/2004  T:  04/06/2004  Job:  [id number]   cc:   [name], M.D. [facility]

## 2011-12-08 DIAGNOSIS — I10 Essential (primary) hypertension: Secondary | ICD-10-CM | POA: Diagnosis not present

## 2011-12-08 DIAGNOSIS — E785 Hyperlipidemia, unspecified: Secondary | ICD-10-CM | POA: Diagnosis not present

## 2012-05-31 DIAGNOSIS — Z23 Encounter for immunization: Secondary | ICD-10-CM | POA: Diagnosis not present

## 2012-07-28 DIAGNOSIS — H2510 Age-related nuclear cataract, unspecified eye: Secondary | ICD-10-CM | POA: Diagnosis not present

## 2012-07-28 DIAGNOSIS — H40029 Open angle with borderline findings, high risk, unspecified eye: Secondary | ICD-10-CM | POA: Diagnosis not present

## 2012-07-28 DIAGNOSIS — H40059 Ocular hypertension, unspecified eye: Secondary | ICD-10-CM | POA: Diagnosis not present

## 2012-09-06 ENCOUNTER — Encounter: Payer: Self-pay | Admitting: Internal Medicine

## 2012-10-19 ENCOUNTER — Inpatient Hospital Stay (HOSPITAL_COMMUNITY): Admission: RE | Admit: 2012-10-19 | Payer: Medicare Other | Source: Ambulatory Visit | Admitting: Gastroenterology

## 2012-10-19 ENCOUNTER — Encounter (HOSPITAL_COMMUNITY): Admission: RE | Payer: Self-pay | Source: Ambulatory Visit

## 2012-10-19 SURGERY — ERCP, WITH INTERVENTION IF INDICATED
Anesthesia: General

## 2012-11-22 ENCOUNTER — Ambulatory Visit (INDEPENDENT_AMBULATORY_CARE_PROVIDER_SITE_OTHER): Payer: Medicare Other | Admitting: Nurse Practitioner

## 2012-11-22 VITALS — BP 160/92 | HR 84 | Temp 98.2°F | Ht 62.0 in | Wt 155.0 lb

## 2012-11-22 DIAGNOSIS — E785 Hyperlipidemia, unspecified: Secondary | ICD-10-CM

## 2012-11-22 DIAGNOSIS — Z23 Encounter for immunization: Secondary | ICD-10-CM | POA: Diagnosis not present

## 2012-11-22 DIAGNOSIS — J45909 Unspecified asthma, uncomplicated: Secondary | ICD-10-CM | POA: Insufficient documentation

## 2012-11-22 DIAGNOSIS — E039 Hypothyroidism, unspecified: Secondary | ICD-10-CM | POA: Insufficient documentation

## 2012-11-22 LAB — THYROID PANEL WITH TSH
Free Thyroxine Index: 2.3 (ref 1.0–3.9)
T3 Uptake: 24.1 % (ref 22.5–37.0)
T4, Total: 9.4 ug/dL (ref 5.0–12.5)
TSH: 7.96 u[IU]/mL — ABNORMAL HIGH (ref 0.350–4.500)

## 2012-11-22 LAB — COMPLETE METABOLIC PANEL WITH GFR
ALT: 13 U/L (ref 0–35)
AST: 15 U/L (ref 0–37)
CO2: 26 mEq/L (ref 19–32)
Chloride: 100 mEq/L (ref 96–112)
Creat: 0.8 mg/dL (ref 0.50–1.10)
GFR, Est African American: 88 mL/min
Sodium: 141 mEq/L (ref 135–145)
Total Bilirubin: 0.7 mg/dL (ref 0.3–1.2)
Total Protein: 6.9 g/dL (ref 6.0–8.3)

## 2012-11-22 MED ORDER — FLUTICASONE-SALMETEROL 100-50 MCG/DOSE IN AEPB: 1.0000 | INHALATION_SPRAY | Freq: Two times a day (BID) | RESPIRATORY_TRACT | Status: DC

## 2012-11-22 MED ORDER — PNEUMOCOCCAL 13-VAL CONJ VACC IM SUSP: 0.5000 mL | Freq: Once | INTRAMUSCULAR | Status: DC

## 2012-11-22 NOTE — Patient Instructions (Signed)

## 2012-11-22 NOTE — Progress Notes (Signed)
Subjective:    Patient ID: [name], female    DOB: 12-11-1945, 67 y.o.   MRN: [id number]  Asthma There is no chest tightness, cough, difficulty breathing, shortness of breath, sputum production or wheezing. This is a chronic problem. The problem occurs rarely. The problem has been unchanged. Pertinent negatives include no appetite change, chest pain or dyspnea on exertion. Her symptoms are aggravated by nothing. Her symptoms are alleviated by nothing. She reports complete improvement on treatment. Her symptoms are not alleviated by ipratropium. There are no known risk factors for lung disease. Her past medical history is significant for asthma.   Hypothyroidism Currently under control. No c/o fatigue. No medication side effects.  Hyperlipidemia Patient has hx of hyperlipidemia but hasnt ever taken meds for it. Has not been watching diet lately nor exeercsing.   Review of Systems  Constitutional: Negative for appetite change.  HENT: Negative.   Eyes: Negative.   Respiratory: Negative for cough, sputum production, shortness of breath and wheezing.   Cardiovascular: Negative.  Negative for chest pain and dyspnea on exertion.  Gastrointestinal: Negative.   Endocrine: Negative.   Genitourinary: Negative.   Musculoskeletal: Negative.   Allergic/Immunologic: Positive for environmental allergies.  Neurological: Negative.   Psychiatric/Behavioral: Negative.    Allergies  Allergen Reactions  . Fosamax (Alendronate Sodium)   . Red Dye   . Sulfa Antibiotics     Outpatient Encounter Prescriptions as of 11/22/2012  Medication Sig Dispense Refill  . albuterol (PROVENTIL HFA;VENTOLIN HFA) 108 (90 BASE) MCG/ACT inhaler Inhale 2 puffs into the lungs every 6 (six) hours as needed for wheezing.      . fluticasone (FLONASE) 50 MCG/ACT nasal spray Place 1 spray into the nose daily.      . fluticasone (VERAMYST) 27.5 MCG/SPRAY nasal spray Place 2 sprays into the nose daily.      .  Fluticasone-Salmeterol (ADVAIR) 100-50 MCG/DOSE AEPB Inhale 1 puff into the lungs every 12 (twelve) hours.      Marland Kitchen levothyroxine (SYNTHROID, LEVOTHROID) 50 MCG tablet Take 50 mcg by mouth daily.      . Multiple Vitamin (MULTIVITAMIN WITH MINERALS) TABS Take 1 tablet by mouth daily.       No facility-administered encounter medications on file as of 11/22/2012.    Past Medical History  Diagnosis Date  . Asthma   . Rhinitis   . Thyroid disease   . Menopausal state   . Vitamin D deficiency disease   . Osteopenia     Past Surgical History  Procedure Laterality Date  . Cholecystectomy    . Tonsillectomy      History   Social History  . Marital Status: Single    Spouse Name: N/A    Number of Children: N/A  . Years of Education: N/A   Occupational History  . Not on file.   Social History Main Topics  . Smoking status: Former Games developer  . Smokeless tobacco: Not on file  . Alcohol Use: No  . Drug Use: No  . Sexually Active: No   Other Topics Concern  . Not on file   Social History Narrative  . No narrative on file         Objective:   Physical Exam  Constitutional: She is oriented to person, place, and time. She appears well-developed and well-nourished.  HENT:  Head: Normocephalic.  Nose: Nose normal.  Mouth/Throat: Oropharynx is clear and moist.  Eyes: EOM are normal. Pupils are equal, round, and reactive to light.  Neck:  Normal range of motion. Neck supple. No JVD present. Carotid bruit is not present. No thyromegaly present.  Cardiovascular: Normal rate, regular rhythm, normal heart sounds and intact distal pulses.   Pulmonary/Chest: Effort normal and breath sounds normal. She has no wheezes. She has no rales.  Abdominal: Soft. Bowel sounds are normal. She exhibits no mass. There is no tenderness.  Musculoskeletal: Normal range of motion.  Lymphadenopathy:    She has no cervical adenopathy.  Neurological: She is alert and oriented to person, place, and time.   Skin: Skin is warm and dry.  Psychiatric: She has a normal mood and affect. Her behavior is normal. Judgment and thought content normal.   BP 160/92  Pulse 84  Temp(Src) 98.2 F (36.8 C) (Oral)  Ht 5\' 2"  (1.575 m)  Wt 155 lb (70.308 kg)  BMI 28.34 kg/m2        Assessment & Plan:  1. Hypothyroidism Continue current dose of levothyroxine daily - Thyroid Panel With TSH  2. Asthma, chronic Continue advair as rx  3. Immunization due - pneumococcal 13-valent conjugate vaccine (PREVNAR 13) injection 0.5 mL; Inject 0.5 mLs into the muscle once.  4. Hyperlipidemia LDL goal < 100 Low fat diet and exercise - COMPLETE METABOLIC PANEL WITH GFR - NMR Lipoprofile with Lipids Mary-Margaret Daphine Deutscher, FNP

## 2012-11-23 ENCOUNTER — Other Ambulatory Visit: Payer: Self-pay | Admitting: Nurse Practitioner

## 2012-11-23 ENCOUNTER — Telehealth: Payer: Self-pay | Admitting: *Deleted

## 2012-11-23 LAB — NMR LIPOPROFILE WITH LIPIDS
Cholesterol, Total: 235 mg/dL — ABNORMAL HIGH (ref <200)
HDL Particle Number: 36.3 umol/L (ref >=30.5)
HDL-C: 53 mg/dL (ref >=40)
LDL (calc): 139 mg/dL — ABNORMAL HIGH (ref <100)
LP-IR Score: 34 (ref <=45)
Large HDL-P: 9.7 umol/L (ref >=4.8)
Triglycerides: 216 mg/dL — ABNORMAL HIGH (ref <150)

## 2012-11-23 MED ORDER — LEVOTHYROXINE SODIUM 75 MCG PO TABS: 75.0000 ug | ORAL_TABLET | Freq: Every day | ORAL | Status: DC

## 2012-11-23 MED ORDER — ATORVASTATIN CALCIUM 40 MG PO TABS: 40.0000 mg | ORAL_TABLET | Freq: Every day | ORAL | Status: DC

## 2012-11-23 NOTE — Telephone Encounter (Signed)
Pt aware of all labs Will start 2 meds noted in mmm result note

## 2012-11-23 NOTE — Telephone Encounter (Signed)
Message copied by Magdalene River on Tue Nov 23, 2012 12:38 PM ------      Message from: [name]      Created: Tue Nov 23, 2012 12:34 PM       TSH elevated- Increase Levothyroxine to =will send RX to pharmacy      LDL particle # and LdL elevated - Lipitor 40mg  QD-will send RX to pharmacy- Strict low fat diet and exercise.      Recheck labs in 3 months ------

## 2012-12-07 ENCOUNTER — Telehealth: Payer: Self-pay | Admitting: Nurse Practitioner

## 2012-12-07 DIAGNOSIS — Z79899 Other long term (current) drug therapy: Secondary | ICD-10-CM | POA: Diagnosis not present

## 2012-12-07 DIAGNOSIS — E860 Dehydration: Secondary | ICD-10-CM | POA: Diagnosis not present

## 2012-12-07 DIAGNOSIS — R197 Diarrhea, unspecified: Secondary | ICD-10-CM | POA: Diagnosis not present

## 2012-12-07 DIAGNOSIS — R112 Nausea with vomiting, unspecified: Secondary | ICD-10-CM | POA: Diagnosis not present

## 2012-12-07 DIAGNOSIS — R1110 Vomiting, unspecified: Secondary | ICD-10-CM | POA: Diagnosis not present

## 2012-12-09 NOTE — Telephone Encounter (Signed)
PT WENT TO ER

## 2013-01-12 ENCOUNTER — Telehealth: Payer: Self-pay | Admitting: Nurse Practitioner

## 2013-01-12 DIAGNOSIS — Z1231 Encounter for screening mammogram for malignant neoplasm of breast: Secondary | ICD-10-CM | POA: Diagnosis not present

## 2013-01-12 MED ORDER — FLUTICASONE PROPIONATE 50 MCG/ACT NA SUSP: 1.0000 | Freq: Every day | NASAL | Status: DC

## 2013-01-12 NOTE — Telephone Encounter (Signed)
Advise

## 2013-01-12 NOTE — Telephone Encounter (Signed)
rx sent to pharmacy

## 2013-03-11 ENCOUNTER — Encounter: Payer: Self-pay | Admitting: Nurse Practitioner

## 2013-03-11 ENCOUNTER — Ambulatory Visit (INDEPENDENT_AMBULATORY_CARE_PROVIDER_SITE_OTHER): Payer: Medicare Other | Admitting: Nurse Practitioner

## 2013-03-11 VITALS — BP 154/84 | HR 87 | Temp 98.4°F | Ht 62.0 in | Wt 139.5 lb

## 2013-03-11 DIAGNOSIS — E785 Hyperlipidemia, unspecified: Secondary | ICD-10-CM | POA: Diagnosis not present

## 2013-03-11 DIAGNOSIS — M949 Disorder of cartilage, unspecified: Secondary | ICD-10-CM

## 2013-03-11 DIAGNOSIS — J45901 Unspecified asthma with (acute) exacerbation: Secondary | ICD-10-CM | POA: Diagnosis not present

## 2013-03-11 DIAGNOSIS — M8580 Other specified disorders of bone density and structure, unspecified site: Secondary | ICD-10-CM

## 2013-03-11 DIAGNOSIS — E039 Hypothyroidism, unspecified: Secondary | ICD-10-CM | POA: Diagnosis not present

## 2013-03-11 DIAGNOSIS — J4521 Mild intermittent asthma with (acute) exacerbation: Secondary | ICD-10-CM

## 2013-03-11 LAB — THYROID PANEL WITH TSH
T3 Uptake: 40.6 % — ABNORMAL HIGH (ref 22.5–37.0)
T4, Total: 13.2 ug/dL — ABNORMAL HIGH (ref 5.0–12.5)
TSH: 0.027 u[IU]/mL — ABNORMAL LOW (ref 0.350–4.500)

## 2013-03-11 LAB — COMPLETE METABOLIC PANEL WITHOUT GFR
ALT: 12 U/L (ref 0–35)
AST: 14 U/L (ref 0–37)
Albumin: 4.1 g/dL (ref 3.5–5.2)
Alkaline Phosphatase: 52 U/L (ref 39–117)
BUN: 9 mg/dL (ref 6–23)
CO2: 26 meq/L (ref 19–32)
Calcium: 10.2 mg/dL (ref 8.4–10.5)
Chloride: 105 meq/L (ref 96–112)
Creat: 0.79 mg/dL (ref 0.50–1.10)
GFR, Est African American: 89 mL/min
GFR, Est Non African American: 77 mL/min
Glucose, Bld: 88 mg/dL (ref 70–99)
Potassium: 4.3 meq/L (ref 3.5–5.3)
Sodium: 139 meq/L (ref 135–145)
Total Bilirubin: 0.6 mg/dL (ref 0.3–1.2)
Total Protein: 6.8 g/dL (ref 6.0–8.3)

## 2013-03-11 MED ORDER — FLUTICASONE PROPIONATE 50 MCG/ACT NA SUSP: 1.0000 | Freq: Every day | NASAL | Status: DC

## 2013-03-11 MED ORDER — LEVOTHYROXINE SODIUM 75 MCG PO TABS: 75.0000 ug | ORAL_TABLET | Freq: Every day | ORAL | Status: DC

## 2013-03-11 MED ORDER — FLUTICASONE-SALMETEROL 100-50 MCG/DOSE IN AEPB: 1.0000 | INHALATION_SPRAY | Freq: Two times a day (BID) | RESPIRATORY_TRACT | Status: DC

## 2013-03-11 MED ORDER — ATORVASTATIN CALCIUM 40 MG PO TABS: 40.0000 mg | ORAL_TABLET | Freq: Every day | ORAL | Status: DC

## 2013-03-11 NOTE — Patient Instructions (Signed)

## 2013-03-11 NOTE — Progress Notes (Signed)
Subjective:    Patient ID: [name], female    DOB: [birth year], 67 y.o.   MRN: [id number]  Thyroid Problem Presents for follow-up (hypothyroidism) visit. Symptoms include weight loss (15 lbs- on purpose). Patient reports no anxiety, constipation, depressed mood, diaphoresis, dry skin, fatigue, hair loss, hoarse voice, leg swelling, nail problem, palpitations, visual change or weight gain. The symptoms have been stable. Her past medical history is significant for hyperlipidemia.  Hyperlipidemia This is a chronic problem. The current episode started more than 1 year ago. The problem is controlled. Recent lipid tests were reviewed and are normal. Exacerbating diseases include hypothyroidism. She has no history of obesity. There are no known factors aggravating her hyperlipidemia. Pertinent negatives include no chest pain, focal sensory loss, focal weakness, leg pain, myalgias or shortness of breath. Current antihyperlipidemic treatment includes statins. The current treatment provides significant improvement of lipids. There are no compliance problems.  Risk factors for coronary artery disease include post-menopausal.  Vitamin d deficiency Vitamin d3 2000 iu qd- tolerating well. Asthma Currently on advair daily and albuterol as needed- haven't used albuterol in over a year. No wheezing or cough    Review of Systems  Constitutional: Positive for weight loss (15 lbs- on purpose). Negative for weight gain, diaphoresis and fatigue.  HENT: Negative for hoarse voice.   Respiratory: Negative for shortness of breath.   Cardiovascular: Negative for chest pain and palpitations.  Gastrointestinal: Negative for constipation.  Musculoskeletal: Negative for myalgias.  Neurological: Negative for focal weakness.       Objective:   Physical Exam  Constitutional: She is oriented to person, place, and time. She appears well-developed and well-nourished.  HENT:  Nose: Nose normal.  Mouth/Throat:  Oropharynx is clear and moist.  Eyes: EOM are normal.  Neck: Trachea normal, normal range of motion and full passive range of motion without pain. Neck supple. No JVD present. Carotid bruit is not present. No thyromegaly present.  Cardiovascular: Normal rate, regular rhythm, normal heart sounds and intact distal pulses.  Exam reveals no gallop and no friction rub.   No murmur heard. Pulmonary/Chest: Effort normal and breath sounds normal.  Abdominal: Soft. Bowel sounds are normal. She exhibits no distension and no mass. There is no tenderness.  Musculoskeletal: Normal range of motion.  Lymphadenopathy:    She has no cervical adenopathy.  Neurological: She is alert and oriented to person, place, and time. She has normal reflexes.  Skin: Skin is warm and dry.  Psychiatric: She has a normal mood and affect. Her behavior is normal. Judgment and thought content normal.    BP 154/84  Pulse 87  Temp(Src) 98.4 F (36.9 C) (Oral)  Ht 5\' 2"  (1.575 m)  Wt 139 lb 8 oz (63.277 kg)  BMI 25.51 kg/m2       Assessment & Plan:  1. Osteopenia SChedule dexa Weight bearing exercises encouraged - DG Bone Density; Future  2. Hyperlipidemia Low fay diet - atorvastatin (LIPITOR) 40 MG tablet; Take 1 tablet (40 mg total) by mouth daily.  Dispense: 30 tablet; Refill: 5 - COMPLETE METABOLIC PANEL WITH GFR - NMR Lipoprofile with Lipids  3. Hypothyroidism  - levothyroxine (SYNTHROID, LEVOTHROID) 75 MCG tablet; Take 1 tablet (75 mcg total) by mouth daily.  Dispense: 30 tablet; Refill: 5 - Thyroid Panel With TSH  4. Asthma with acute exacerbation, mild intermittent Continue inhalers as rx - Fluticasone-Salmeterol (ADVAIR) 100-50 MCG/DOSE AEPB; Inhale 1 puff into the lungs every 12 (twelve) hours.  Dispense: 60 each; Refill:  5 - fluticasone (FLONASE) 50 MCG/ACT nasal spray; Place 1 spray into the nose daily.  Dispense: 16 g; Refill: 5  Jennifer Daphine Deutscher, FNP

## 2013-03-15 ENCOUNTER — Other Ambulatory Visit: Payer: Self-pay | Admitting: Nurse Practitioner

## 2013-03-15 LAB — NMR LIPOPROFILE WITH LIPIDS
HDL Particle Number: 33 umol/L (ref >=30.5)
HDL-C: 47 mg/dL (ref >=40)
LDL (calc): 107 mg/dL — ABNORMAL HIGH (ref <100)
LDL Size: 19.9 nm — ABNORMAL LOW (ref >20.5)
LP-IR Score: 41 (ref <=45)
Large HDL-P: 6.7 umol/L (ref >=4.8)
Triglycerides: 171 mg/dL — ABNORMAL HIGH (ref <150)

## 2013-03-15 MED ORDER — LEVOTHYROXINE SODIUM 50 MCG PO TABS: 50.0000 ug | ORAL_TABLET | Freq: Every day | ORAL | Status: DC

## 2013-04-27 ENCOUNTER — Ambulatory Visit (INDEPENDENT_AMBULATORY_CARE_PROVIDER_SITE_OTHER): Payer: Medicare Other

## 2013-04-27 ENCOUNTER — Ambulatory Visit (INDEPENDENT_AMBULATORY_CARE_PROVIDER_SITE_OTHER): Payer: Medicare Other | Admitting: Pharmacist

## 2013-04-27 VITALS — Ht 61.0 in | Wt 134.5 lb

## 2013-04-27 DIAGNOSIS — E039 Hypothyroidism, unspecified: Secondary | ICD-10-CM | POA: Diagnosis not present

## 2013-04-27 DIAGNOSIS — M899 Disorder of bone, unspecified: Secondary | ICD-10-CM | POA: Diagnosis not present

## 2013-04-27 DIAGNOSIS — M8580 Other specified disorders of bone density and structure, unspecified site: Secondary | ICD-10-CM

## 2013-04-27 NOTE — Progress Notes (Signed)
Patient ID: [name], female   DOB: [birth year], 67 y.o.   MRN: [id number] [facility] Current Height: Height: 5\' 1"  (154.9 cm)      Max Lifetime Height: 5\' 2"  Current Weight: Weight: 134 lb 8 oz (61.009 kg)       Ethnicity:Caucasian    HPI: Does pt already have a diagnosis of:  Osteopenia?  Yes Osteoporosis?  No  Back Pain?  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  No Med(s) for Osteoporosis/Osteopenia:  none Med(s) previously tried for Osteoporosis/Osteopenia:  actonel 150mg  monthly - insurance didn't cover, alendronate 70mg  weekly - caused bone pain.                                                             PMH: Age at menopause:  67 yo Hysterectomy?  No Oophorectomy?  No HRT? No Steroid Use?  Yes - inhaled steroid / advair Thyroid med?  Yes - recent dose decrease due to overcorrection of TSH / thyroid medication. History of cancer?  No History of digestive disorders (ie Crohn's)?  No Current or previous eating disorders?  No Last Vitamin D Result:  35 (02/2011) Last GFR Result:  77 (02/2013)   FH/SH: Family history of osteoporosis?  Yes - mother and maternal aunts Parent with history of hip fracture?  No Family history of breast cancer?  No Exercise?  Yes - walking daily Smoking?  No Alcohol?  No    Calcium Assessment Calcium Intake  # of servings/day  Calcium mg  Milk (8 oz) 0.5  x  300  = 150mg   Yogurt (4 oz) 0 x  200 = 0  Cheese (1 oz) 0 x  200 = 0  Other Calcium sources   250mg   Ca supplement MVI = 400mg    Estimated calcium intake per day 800mg     DEXA Results Date of Test T-Score for AP Spine L1-L4 T-Score for Total Left Hip T-Score for Total Right Hip  04/27/2013 -0.4 -0.8 -0.3  04/16/2011 -0.4 -0.7 -0.4  12/27/2008 -0.7 -0.7 -0.2  12/21/2006 -0.4 -0.7 -0.1  ** T-Score for neck of left hip was -1.3 today 04/27/2013**  FRAX 10 year estimate: Total FX risk:  9.7%  (consider medication if >/= 20%) Hip FX risk:  1.2%  (consider  medication if >/= 3%)  Assessment: Osteopenia with stable BMD Hypothyroidism - last check about 6 weeks ago showed low TSH.  Levothyroxine dose was decreased.  Due to recheck today.     Recommendations: 1.  Discussed DEXA results and fracture risk 2.  recommend calcium 1200mg  daily through supplementation or diet.  3.  recommend weight bearing exercise - 30 minutes at least 4 days  per week.   4.  Counseled and educated about fall risk and prevention. 5.  Vitamin D and thyroid panel drawn today - pending.  Recheck DEXA:  2 years  Time spent counseling patient:  30 minutes

## 2013-04-28 LAB — VITAMIN D 25 HYDROXY (VIT D DEFICIENCY, FRACTURES): Vit D, 25-Hydroxy: 41.7 ng/mL (ref 30.0–100.0)

## 2013-04-28 LAB — THYROID PANEL WITH TSH: Free Thyroxine Index: 2.8 (ref 1.2–4.9)

## 2013-05-02 ENCOUNTER — Telehealth: Payer: Self-pay | Admitting: Pharmacist

## 2013-05-02 NOTE — Telephone Encounter (Signed)
Thyroid panel was all WNL.  Recommend continue current levothyroxine dose of daily. Vitamin D also normal - continue current vitamin D supplementation.

## 2013-05-16 ENCOUNTER — Encounter: Payer: Self-pay | Admitting: Internal Medicine

## 2013-06-10 ENCOUNTER — Ambulatory Visit (INDEPENDENT_AMBULATORY_CARE_PROVIDER_SITE_OTHER): Payer: Medicare Other | Admitting: *Deleted

## 2013-06-10 DIAGNOSIS — Z23 Encounter for immunization: Secondary | ICD-10-CM | POA: Diagnosis not present

## 2013-07-26 ENCOUNTER — Telehealth: Payer: Self-pay | Admitting: Pharmacist

## 2013-07-26 NOTE — Telephone Encounter (Signed)
Patient called to schedule appt for annual wellness visit - appt made for 09/01/13 at 8am.

## 2013-08-02 DIAGNOSIS — H40059 Ocular hypertension, unspecified eye: Secondary | ICD-10-CM | POA: Diagnosis not present

## 2013-08-02 DIAGNOSIS — H2510 Age-related nuclear cataract, unspecified eye: Secondary | ICD-10-CM | POA: Diagnosis not present

## 2013-08-02 DIAGNOSIS — H0230 Blepharochalasis unspecified eye, unspecified eyelid: Secondary | ICD-10-CM | POA: Diagnosis not present

## 2013-08-02 DIAGNOSIS — H04129 Dry eye syndrome of unspecified lacrimal gland: Secondary | ICD-10-CM | POA: Diagnosis not present

## 2013-09-01 ENCOUNTER — Ambulatory Visit (INDEPENDENT_AMBULATORY_CARE_PROVIDER_SITE_OTHER): Payer: Medicare Other | Admitting: Pharmacist

## 2013-09-01 ENCOUNTER — Encounter: Payer: Self-pay | Admitting: Pharmacist

## 2013-09-01 VITALS — BP 140/78 | HR 77 | Ht 61.0 in | Wt 139.0 lb

## 2013-09-01 DIAGNOSIS — L603 Nail dystrophy: Secondary | ICD-10-CM

## 2013-09-01 DIAGNOSIS — Z0000 Encounter for general adult medical examination without abnormal findings: Secondary | ICD-10-CM | POA: Diagnosis not present

## 2013-09-01 MED ORDER — LEVOTHYROXINE SODIUM 50 MCG PO TABS: 50.0000 ug | ORAL_TABLET | Freq: Every day | ORAL | Status: DC

## 2013-09-01 NOTE — Progress Notes (Signed)
Subjective:    [name] is a 68 y.o. female who presents for Medicare Initial preventive examination.  Preventive Screening-Counseling & Management  Tobacco History  Smoking status  . Never Smoker   Smokeless tobacco  . Never Used     Current Problems (verified) Patient Active Problem List   Diagnosis Date Noted  . Osteopenia 03/11/2013  . Hyperlipidemia 03/11/2013  . Hypothyroidism 11/22/2012  . Asthma, chronic 11/22/2012    Medications Prior to Visit Current Outpatient Prescriptions on File Prior to Visit  Medication Sig Dispense Refill  . albuterol (PROVENTIL HFA;VENTOLIN HFA) 108 (90 BASE) MCG/ACT inhaler Inhale 2 puffs into the lungs every 6 (six) hours as needed for wheezing.      . Cholecalciferol (VITAMIN D3) 3000 UNITS TABS Take 2,000 Units by mouth daily.       . fluticasone (FLONASE) 50 MCG/ACT nasal spray Place 2 sprays into the nose daily.      . Fluticasone-Salmeterol (ADVAIR) 100-50 MCG/DOSE AEPB Inhale 1 puff into the lungs every 12 (twelve) hours.  60 each  5  . Multiple Vitamin (MULTIVITAMIN WITH MINERALS) TABS Take 1 tablet by mouth daily.      Marland Kitchen atorvastatin (LIPITOR) 40 MG tablet Take 1 tablet (40 mg total) by mouth daily.  30 tablet  5   Current Facility-Administered Medications on File Prior to Visit  Medication Dose Route Frequency Provider Last Rate Last Dose  . pneumococcal 13-valent conjugate vaccine (PREVNAR 13) injection 0.5 mL  0.5 mL Intramuscular Once Mary-Margaret Daphine Deutscher, FNP        Current Medications (verified) Current Outpatient Prescriptions  Medication Sig Dispense Refill  . albuterol (PROVENTIL HFA;VENTOLIN HFA) 108 (90 BASE) MCG/ACT inhaler Inhale 2 puffs into the lungs every 6 (six) hours as needed for wheezing.      . Cholecalciferol (VITAMIN D3) 3000 UNITS TABS Take 2,000 Units by mouth daily.       . fluticasone (FLONASE) 50 MCG/ACT nasal spray Place 2 sprays into the nose daily.      . Fluticasone-Salmeterol (ADVAIR) 100-50  MCG/DOSE AEPB Inhale 1 puff into the lungs every 12 (twelve) hours.  60 each  5  . levothyroxine (SYNTHROID, LEVOTHROID) 50 MCG tablet Take 1 tablet (50 mcg total) by mouth daily.  30 tablet  0  . Multiple Vitamin (MULTIVITAMIN WITH MINERALS) TABS Take 1 tablet by mouth daily.      Marland Kitchen atorvastatin (LIPITOR) 40 MG tablet Take 1 tablet (40 mg total) by mouth daily.  30 tablet  5   Current Facility-Administered Medications  Medication Dose Route Frequency Provider Last Rate Last Dose  . pneumococcal 13-valent conjugate vaccine (PREVNAR 13) injection 0.5 mL  0.5 mL Intramuscular Once Mary-Margaret Daphine Deutscher, FNP         Allergies (verified) Fosamax; Red dye; and Sulfa antibiotics   PAST HISTORY  Family History Family History  Problem Relation Age of Onset  . Hyperlipidemia Mother   . Hypertension Mother   . Thyroid disease Mother   . Osteoporosis Mother   . Heart disease Father   . Heart attack Father   . Hypertension Sister   . Gout Sister   . Hypertension Brother     Social History History  Substance Use Topics  . Smoking status: Never Smoker   . Smokeless tobacco: Never Used  . Alcohol Use: No     Are there smokers in your home (other than you)? No  Risk Factors Current exercise habits: Gym/ health club routine includes walking on track  and at the recreation center.  Dietary issues discussed: discussed ways to increase calcium in diet and lower fat intake   Cardiac risk factors: advanced age (older than 51 for men, 27 for women), dyslipidemia, family history of premature cardiovascular disease and hypertension.  Depression Screen (Note: if answer to either of the following is "Yes", a more complete depression screening is indicated)   Over the past 2 weeks, have you felt down, depressed or hopeless? No  Over the past 2 weeks, have you felt little interest or pleasure in doing things? No  Have you lost interest or pleasure in daily life? No  Do you often feel hopeless?  No  Do you cry easily over simple problems? No  Activities of Daily Living In your present state of health, do you have any difficulty performing the following activities?:  Driving? No Managing money?  No Feeding yourself? No Getting from bed to chair? No Climbing a flight of stairs? Yes - having some hip pain  Preparing food and eating?: No Bathing or showering? No Getting dressed: No Getting to the toilet? No Using the toilet:No Moving around from place to place: No In the past year have you fallen or had a near fall?:No   Are you sexually active?  No  Do you have more than one partner?  No  Hearing Difficulties: Yes Do you often ask people to speak up or repeat themselves? Yes Do you experience ringing or noises in your ears? No Do you have difficulty understanding soft or whispered voices? Yes   Do you feel that you have a problem with memory? No  Do you often misplace items? No  Do you feel safe at home?  Yes  Cognitive Testing  Alert? Yes  Normal Appearance?Yes  Oriented to person? Yes  Place? Yes   Time? Yes  Recall of three objects?  Yes  Can perform simple calculations? Yes  Displays appropriate judgment?Yes  Can read the correct time from a watch face?Yes   Advanced Directives have been discussed with the patient? Yes  List the Names of Other Physician/Practitioners you currently use: N/A  Indicate any recent Medical Services you may have received from other than [facility] providers in the past year (date may be approximate). N/A  Immunization History  Administered Date(s) Administered  . Influenza,inj,Quad PF,36+ Mos 06/10/2013  . Pneumococcal Polysaccharide-23 11/22/2012    Screening Tests Health Maintenance  Topic Date Due  . Zostavax  09/11/2013  . Colonoscopy  10/24/2013  . Influenza Vaccine  03/25/2014  . Mammogram  01/13/2015  . Tetanus/tdap  08/25/2017  . Pneumococcal Polysaccharide Vaccine Age 84 And Over  Completed  DEXA was last done  04/2013  All answers were reviewed with the patient and necessary referrals were made:  Henrene Pastor, [facility]   09/01/2013   History reviewed: allergies, current medications, past family history, past medical history, past social history, past surgical history and problem list     Objective:     Body mass index is 26.28 kg/(m^2). BP 140/78  Pulse 77  Ht 5\' 1"  (1.549 m)  Wt 139 lb (63.05 kg)  BMI 26.28 kg/m2     Assessment:     Medicare Annual Wellness Visit     Plan:   During the course of the visit the patient was educated and counseled about appropriate screening and preventive services including:    Pneumococcal vaccine   Influenza vaccine  Td vaccine  Screening mammography  Screening Pap smear and pelvic exam  Bone densitometry screening  Colorectal cancer screening  Diabetes screening  Advanced directives: information provided for [facility] [place]  Diet review for nutrition referral?  Not Indicated - discusses low fat diet and ways to increase calcium in diet Recommended referral to audiologist - patient declined at this time Colonoscopy - patient will schedule in spring. Information given to patient [facility].   Patient Instructions (the written plan) was given to the patient.  Medicare Attestation I have personally reviewed: The patient's medical and social history Their use of alcohol, tobacco or illicit drugs Their current medications and supplements The patient's functional ability including ADLs,fall risks, home safety risks, cognitive, and hearing and visual impairment Diet and physical activities Evidence for depression or mood disorders  The patient's weight, height, BMI, and BP have been recorded in the chart.  I have made referrals, counseling, and provided education to the patient based on review of the above and I have provided the  patient with a written personalized care plan for preventive services.     Henrene Pastorckard, Tonna Palazzi, [facility]   09/01/2013

## 2013-09-01 NOTE — Patient Instructions (Addendum)
Health Maintenance Summary    ZOSTAVAX Postponed  Postponed until 09/11/2013.    COLONOSCOPY Postponed  Postponed until 10/24/2013 Until warmer.     INFLUENZA VACCINE Next Due 03/25/2014  Last done 05/2013    MAMMOGRAM Next Due 01/12/2014  Last done05/2014    TETANUS/TDAP Next Due 08/25/2017  last done 2009   DEXA Next Due  04/2015 Last done 04/2013           Preventive Care for Adults, Female   A healthy lifestyle and preventive care can promote health and wellness. Preventive health guidelines for women include the following key practices.  A routine yearly physical is a good way to check with your caregiver about your health and preventive screening. It is a chance to share any concerns and updates on your health, and to receive a thorough exam.  Visit your dentist for a routine exam and preventive care every 6 months. Brush your teeth twice a day and floss once a day. Good oral hygiene prevents tooth decay and gum disease.  The frequency of eye exams is based on your age, health, family medical history, use of contact lenses, and other factors. Follow your caregiver's recommendations for frequency of eye exams.  Eat a healthy diet. Foods like vegetables, fruits, whole grains, low-fat dairy products, and lean protein foods contain the nutrients you need without too many calories. Decrease your intake of foods high in solid fats, added sugars, and salt. Eat the right amount of calories for you.Get information about a proper diet from your caregiver, if necessary.  Regular physical exercise is one of the most important things you can do for your health. Most adults should get at least 150 minutes of moderate-intensity exercise (any activity that increases your heart rate and causes you to sweat) each week. In addition, most adults need muscle-strengthening exercises on 2 or more days a week.  Maintain a healthy weight. The body mass index (BMI) is a screening tool to identify possible weight  problems. It provides an estimate of body fat based on height and weight. Your caregiver can help determine your BMI, and can help you achieve or maintain a healthy weight.For adults 20 years and older:  A BMI below 18.5 is considered underweight.  A BMI of 18.5 to 24.9 is normal.  A BMI of 25 to 29.9 is considered overweight.  A BMI of 30 and above is considered obese.  Maintain normal blood lipids and cholesterol levels by exercising and minimizing your intake of saturated fat. Eat a balanced diet with plenty of fruit and vegetables. Blood tests for lipids and cholesterol should begin at age 70 and be repeated every 5 years. If your lipid or cholesterol levels are high, you are over 50, or you are at high risk for heart disease, you may need your cholesterol levels checked more frequently.Ongoing high lipid and cholesterol levels should be treated with medicines if diet and exercise are not effective.  If you smoke, find out from your caregiver how to quit. If you do not use tobacco, do not start.  Lung cancer screening is recommended for adults aged 70 80 years who are at high risk for developing lung cancer because of a history of smoking. Yearly low-dose computed tomography (CT) is recommended for people who have at least a 30-pack-year history of smoking and are a current smoker or have quit within the past 15 years. A pack year of smoking is smoking an average of 1 pack of cigarettes a day  for 1 year (for example: 1 pack a day for 30 years or 2 packs a day for 15 years). Yearly screening should continue until the smoker has stopped smoking for at least 15 years. Yearly screening should also be stopped for people who develop a health problem that would prevent them from having lung cancer treatment.  High blood pressure causes heart disease and increases the risk of stroke. Your blood pressure should be checked at least every 1 to 2 years. Ongoing high blood pressure should be treated with  medicines if weight loss and exercise are not effective.  If you are 86 to 70 years old, ask your caregiver if you should take aspirin to prevent strokes.  Diabetes screening involves taking a blood sample to check your fasting blood sugar level. This should be done once every 3 years, after age 22, if you are within normal weight and without risk factors for diabetes. Testing should be considered at a younger age or be carried out more frequently if you are overweight and have at least 1 risk factor for diabetes.  Breast cancer screening is essential preventive care for women. You should practice "breast self-awareness." This means understanding the normal appearance and feel of your breasts and may include breast self-examination. Any changes detected, no matter how small, should be reported to a caregiver. Women in their 62s and 30s should have a clinical breast exam (CBE) by a caregiver as part of a regular health exam every 1 to 3 years. After age 51, women should have a CBE every year. Starting at age 43, women should consider having a mammography (breast X-ray test) every year. Women who have a family history of breast cancer should talk to their caregiver about genetic screening. Women at a high risk of breast cancer should talk to their caregivers about having magnetic resonance imaging (MRI) and a mammography every year.  The Pap test is a screening test for cervical cancer. A Pap test can show cell changes on the cervix that might become cervical cancer if left untreated. A Pap test is a procedure in which cells are obtained and examined from the lower end of the uterus (cervix).  Beginning at age 69, you should have a Pap test every 3 years as long as the past 3 Pap tests have been normal.  Some women have medical problems that increase the chance of getting cervical cancer. Talk to your caregiver about these problems. It is especially important to talk to your caregiver if a new problem  develops soon after your last Pap test. In these cases, your caregiver may recommend more frequent screening and Pap tests.  If you had a hysterectomy for a problem that was not cancer or a condition that could lead to cancer, then you no longer need Pap tests. Even if you no longer need a Pap test, a regular exam is a good idea to make sure no other problems are starting.  If you are between ages 7 and 43, and you have had normal Pap tests going back 10 years, you no longer need Pap tests. Even if you no longer need a Pap test, a regular exam is a good idea to make sure no other problems are starting.  If you have had past treatment for cervical cancer or a condition that could lead to cancer, you need Pap tests and screening for cancer for at least 20 years after your treatment.  If Pap tests have been discontinued, risk factors (such  as a new sexual partner) need to be reassessed to determine if screening should be resumed.  factors for colon cancer. On a yearly basis, your caregiver may provide home test kits to check for hidden blood in the stool. Use of a small camera at the end of a tube, to directly examine the colon (sigmoidoscopy or colonoscopy), can detect the earliest forms of colorectal cancer. Talk to your caregiver about this at age 41, when routine screening begins. Direct examination of the colon should be repeated every 5 to 10 years through age 42, unless early forms of pre-cancerous polyps or small growths are found.  Hepatitis C blood testing is recommended for all people born from [birth year] through 1967 and any individual with known risks for hepatitis C.  Osteoporosis is a disease in which the bones lose minerals and strength with aging. This can result in serious bone fractures. The risk of osteoporosis can be identified using a bone density scan. Women ages 41 and over and women at risk for fractures or osteoporosis should discuss screening with their caregivers. Ask your  caregiver whether you should take a calcium supplement or vitamin D to reduce the rate of osteoporosis.  Menopause can be associated with physical symptoms and risks. Hormone replacement therapy is available to decrease symptoms and risks. You should talk to your caregiver about whether hormone replacement therapy is right for you.  Use sunscreen. Apply sunscreen liberally and repeatedly throughout the day. You should seek shade when your shadow is shorter than you. Protect yourself by wearing long sleeves, pants, a wide-brimmed hat, and sunglasses year round, whenever you are outdoors.  Once a month, do a whole body skin exam, using a mirror to look at the skin on your back. Notify your caregiver of new moles, moles that have irregular borders, moles that are larger than a pencil eraser, or moles that have changed in shape or color.  Stay current with required immunizations.  Influenza vaccine. All adults should be immunized every year.  Tetanus, diphtheria, and acellular pertussis (Td, Tdap) vaccine. Pregnant women should receive 1 dose of Tdap vaccine during each pregnancy. The dose should be obtained regardless of the length of time since the last dose. Immunization is preferred during the 27th to 36th week of gestation. An adult who has not previously received Tdap or who does not know her vaccine status should receive 1 dose of Tdap. This initial dose should be followed by tetanus and diphtheria toxoids (Td) booster doses every 10 years. Adults with an unknown or incomplete history of completing a 3-dose immunization series with Td-containing vaccines should begin or complete a primary immunization series including a Tdap dose. Adults should receive a Td booster every 10 years.  Varicella vaccine. An adult without evidence of immunity to varicella should receive 2 doses or a second dose if she has previously received 1 dose. Pregnant females who do not have evidence of immunity should receive the  first dose after pregnancy. This first dose should be obtained before leaving the health care facility. The second dose should be obtained 4 8 weeks after the first dose.  Zoster vaccine. One dose is recommended for adults aged 10 years or older unless certain conditions are present.

## 2013-09-26 ENCOUNTER — Encounter: Payer: Self-pay | Admitting: Nurse Practitioner

## 2013-09-26 ENCOUNTER — Ambulatory Visit (INDEPENDENT_AMBULATORY_CARE_PROVIDER_SITE_OTHER): Payer: Medicare Other | Admitting: Nurse Practitioner

## 2013-09-26 VITALS — BP 132/80 | HR 91 | Temp 97.0°F | Ht 61.0 in | Wt 137.0 lb

## 2013-09-26 DIAGNOSIS — M949 Disorder of cartilage, unspecified: Secondary | ICD-10-CM

## 2013-09-26 DIAGNOSIS — M899 Disorder of bone, unspecified: Secondary | ICD-10-CM

## 2013-09-26 DIAGNOSIS — E785 Hyperlipidemia, unspecified: Secondary | ICD-10-CM

## 2013-09-26 DIAGNOSIS — M8580 Other specified disorders of bone density and structure, unspecified site: Secondary | ICD-10-CM

## 2013-09-26 DIAGNOSIS — E039 Hypothyroidism, unspecified: Secondary | ICD-10-CM

## 2013-09-26 MED ORDER — FLUTICASONE PROPIONATE 50 MCG/ACT NA SUSP: 2.0000 | Freq: Every day | NASAL | Status: DC

## 2013-09-26 MED ORDER — FLUTICASONE-SALMETEROL 100-50 MCG/DOSE IN AEPB: 1.0000 | INHALATION_SPRAY | Freq: Two times a day (BID) | RESPIRATORY_TRACT | Status: DC

## 2013-09-26 MED ORDER — LEVOTHYROXINE SODIUM 50 MCG PO TABS: 50.0000 ug | ORAL_TABLET | Freq: Every day | ORAL | Status: DC

## 2013-09-26 NOTE — Progress Notes (Signed)
Subjective:    Patient ID: [name], female    DOB: Mar 16, 1946, 68 y.o.   MRN: [id number]  Asthma There is no chest tightness, cough, difficulty breathing, shortness of breath, sputum production or wheezing. This is a chronic problem. The problem occurs rarely. The problem has been unchanged. Pertinent negatives include no appetite change, chest pain or dyspnea on exertion. Her symptoms are aggravated by nothing. Her symptoms are alleviated by nothing. She reports complete improvement on treatment. Her symptoms are not alleviated by ipratropium. There are no known risk factors for lung disease. Her past medical history is significant for asthma.  Hyperlipidemia This is a chronic problem. The current episode started more than 1 year ago. The problem is uncontrolled. Recent lipid tests were reviewed and are high. Exacerbating diseases include hypothyroidism. She has no history of diabetes or obesity. There are no known factors aggravating her hyperlipidemia. Pertinent negatives include no chest pain or shortness of breath. Treatments tried: patient was told to start on lipitor but she never did. Wanted to try to get down without meds. Compliance problems include adherence to diet and adherence to exercise.  Risk factors for coronary artery disease include post-menopausal.   Hypothyroidism Currently under control. No c/o fatigue. No medication side effects.   Review of Systems  Constitutional: Negative for appetite change.  HENT: Negative.   Eyes: Negative.   Respiratory: Negative for cough, sputum production, shortness of breath and wheezing.   Cardiovascular: Negative.  Negative for chest pain and dyspnea on exertion.  Gastrointestinal: Negative.   Endocrine: Negative.   Genitourinary: Negative.   Musculoskeletal: Negative.   Allergic/Immunologic: Positive for environmental allergies.  Neurological: Negative.   Psychiatric/Behavioral: Negative.           Objective:   Physical  Exam  Constitutional: She is oriented to person, place, and time. She appears well-developed and well-nourished.  HENT:  Head: Normocephalic.  Nose: Nose normal.  Mouth/Throat: Oropharynx is clear and moist.  Eyes: EOM are normal. Pupils are equal, round, and reactive to light.  Neck: Normal range of motion. Neck supple. No JVD present. Carotid bruit is not present. No thyromegaly present.  Cardiovascular: Normal rate, regular rhythm, normal heart sounds and intact distal pulses.   Pulmonary/Chest: Effort normal and breath sounds normal. She has no wheezes. She has no rales.  Abdominal: Soft. Bowel sounds are normal. She exhibits no mass. There is no tenderness.  Musculoskeletal: Normal range of motion.  Lymphadenopathy:    She has no cervical adenopathy.  Neurological: She is alert and oriented to person, place, and time.  Skin: Skin is warm and dry.  Psychiatric: She has a normal mood and affect. Her behavior is normal. Judgment and thought content normal.   BP 132/80  Pulse 91  Temp(Src) 97 F (36.1 C) (Oral)  Ht 5' 1"  (1.549 m)  Wt 137 lb (62.143 kg)  BMI 25.90 kg/m2        Assessment & Plan:   1. Osteopenia   2. Hypothyroidism   3. Hyperlipidemia    Orders Placed This Encounter  Procedures  . CMP14+EGFR  . NMR, lipoprofile   Meds ordered this encounter  Medications  . levothyroxine (SYNTHROID, LEVOTHROID) 50 MCG tablet    Sig: Take 1 tablet (50 mcg total) by mouth daily.    Dispense:  30 tablet    Refill:  5    Order Specific Question:  Supervising Provider    Answer:  Chipper Herb [[id number]]  . Fluticasone-Salmeterol (ADVAIR) 100-50  MCG/DOSE AEPB    Sig: Inhale 1 puff into the lungs every 12 (twelve) hours.    Dispense:  60 each    Refill:  5    Order Specific Question:  Supervising Provider    Answer:  Chipper Herb [[id number]]  . fluticasone (FLONASE) 50 MCG/ACT nasal spray    Sig: Place 2 sprays into both nostrils daily.    Dispense:  16 g    Refill:  5     Order Specific Question:  Supervising Provider    Answer:  Chipper Herb [place] pending Health maintenance reviewed Diet and exercise encouraged Continue all meds Follow up  In 3 months   [place], FNP

## 2013-09-26 NOTE — Patient Instructions (Signed)

## 2013-09-28 ENCOUNTER — Telehealth: Payer: Self-pay | Admitting: *Deleted

## 2013-09-28 LAB — CMP14+EGFR
ALK PHOS: 53 IU/L (ref 39–117)
ALT: 10 IU/L (ref 0–32)
AST: 14 IU/L (ref 0–40)
Albumin/Globulin Ratio: 1.6 (ref 1.1–2.5)
Albumin: 4.2 g/dL (ref 3.6–4.8)
BILIRUBIN TOTAL: 0.5 mg/dL (ref 0.0–1.2)
BUN/Creatinine Ratio: 11 (ref 11–26)
BUN: 10 mg/dL (ref 8–27)
CHLORIDE: 100 mmol/L (ref 97–108)
CO2: 25 mmol/L (ref 18–29)
Calcium: 10.7 mg/dL — ABNORMAL HIGH (ref 8.7–10.3)
Creatinine, Ser: 0.89 mg/dL (ref 0.57–1.00)
GFR calc non Af Amer: 66 mL/min/{1.73_m2} (ref >59)
GFR, EST AFRICAN AMERICAN: 76 mL/min/{1.73_m2} (ref >59)
GLUCOSE: 88 mg/dL (ref 65–99)
Globulin, Total: 2.6 g/dL (ref 1.5–4.5)
POTASSIUM: 4.9 mmol/L (ref 3.5–5.2)
SODIUM: 139 mmol/L (ref 134–144)
TOTAL PROTEIN: 6.8 g/dL (ref 6.0–8.5)

## 2013-09-28 LAB — NMR, LIPOPROFILE
Cholesterol: 218 mg/dL — ABNORMAL HIGH (ref <200)
HDL Cholesterol by NMR: 62 mg/dL (ref >=40)
HDL Particle Number: 36.2 umol/L (ref >=30.5)
LDL PARTICLE NUMBER: 1744 nmol/L — AB (ref <1000)
LDL SIZE: 20.5 nm — AB (ref >20.5)
LDLC SERPL CALC-MCNC: 126 mg/dL — ABNORMAL HIGH (ref <100)
LP-IR SCORE: 29 (ref <=45)
SMALL LDL PARTICLE NUMBER: 840 nmol/L — AB (ref <=527)
TRIGLYCERIDES BY NMR: 152 mg/dL — AB (ref <150)

## 2013-09-28 NOTE — Telephone Encounter (Signed)
Message copied by Almeta MonasSTONE, Justus Duerr M on Wed Sep 28, 2013  9:45 AM ------      Message from: Bennie PieriniMARTIN, MARY-MARGARET      Created: Wed Sep 28, 2013  7:55 AM       cmp normal      LDL particle numbers elevated as well as ldl- really need to be on a statin- will agree to take? ------

## 2013-09-28 NOTE — Telephone Encounter (Signed)
Lm,please call. Need to start cholesterol medication.

## 2013-10-21 NOTE — Telephone Encounter (Signed)
No response

## 2014-02-07 DIAGNOSIS — Z1231 Encounter for screening mammogram for malignant neoplasm of breast: Secondary | ICD-10-CM | POA: Diagnosis not present

## 2014-02-07 DIAGNOSIS — T148 Other injury of unspecified body region: Secondary | ICD-10-CM | POA: Diagnosis not present

## 2014-03-20 ENCOUNTER — Encounter: Payer: Self-pay | Admitting: Internal Medicine

## 2014-04-04 ENCOUNTER — Other Ambulatory Visit: Payer: Self-pay | Admitting: Nurse Practitioner

## 2014-04-12 ENCOUNTER — Other Ambulatory Visit: Payer: Self-pay | Admitting: Nurse Practitioner

## 2014-04-24 ENCOUNTER — Encounter: Payer: Self-pay | Admitting: Nurse Practitioner

## 2014-04-24 ENCOUNTER — Ambulatory Visit (INDEPENDENT_AMBULATORY_CARE_PROVIDER_SITE_OTHER): Payer: Medicare Other | Admitting: Nurse Practitioner

## 2014-04-24 VITALS — BP 136/88 | HR 81 | Temp 97.4°F | Ht 61.0 in | Wt 143.0 lb

## 2014-04-24 DIAGNOSIS — E0789 Other specified disorders of thyroid: Secondary | ICD-10-CM | POA: Diagnosis not present

## 2014-04-24 DIAGNOSIS — E038 Other specified hypothyroidism: Secondary | ICD-10-CM | POA: Diagnosis not present

## 2014-04-24 DIAGNOSIS — E785 Hyperlipidemia, unspecified: Secondary | ICD-10-CM

## 2014-04-24 DIAGNOSIS — J4520 Mild intermittent asthma, uncomplicated: Secondary | ICD-10-CM

## 2014-04-24 DIAGNOSIS — J45909 Unspecified asthma, uncomplicated: Secondary | ICD-10-CM

## 2014-04-24 DIAGNOSIS — E034 Atrophy of thyroid (acquired): Secondary | ICD-10-CM

## 2014-04-24 NOTE — Patient Instructions (Signed)

## 2014-04-24 NOTE — Progress Notes (Signed)
  Subjective:    Patient ID: [name], female    DOB: [birth year], 68 y.o.   MRN: [id number]  Patient here today for follow up of chronic medical problems.   Asthma There is no chest tightness, cough, difficulty breathing, shortness of breath, sputum production or wheezing. This is a chronic problem. The problem occurs rarely. The problem has been unchanged. Pertinent negatives include no appetite change, chest pain or dyspnea on exertion. Her symptoms are aggravated by nothing. Her symptoms are alleviated by nothing. She reports complete improvement on treatment. Her symptoms are not alleviated by ipratropium. There are no known risk factors for lung disease. Her past medical history is significant for asthma.  Hyperlipidemia This is a chronic problem. The current episode started more than 1 year ago. The problem is uncontrolled. Recent lipid tests were reviewed and are high. Exacerbating diseases include hypothyroidism. She has no history of diabetes or obesity. There are no known factors aggravating her hyperlipidemia. Pertinent negatives include no chest pain or shortness of breath. Treatments tried: patient was told to start on lipitor but she never did. Wanted to try to get down without meds. Compliance problems include adherence to diet and adherence to exercise.  Risk factors for coronary artery disease include post-menopausal.  Hypothyroidism Currently under control. No c/o fatigue. No medication side effects.   Review of Systems  Constitutional: Negative for appetite change.  HENT: Negative.   Eyes: Negative.   Respiratory: Negative for cough, sputum production, shortness of breath and wheezing.   Cardiovascular: Negative.  Negative for chest pain and dyspnea on exertion.  Gastrointestinal: Negative.   Endocrine: Negative.   Genitourinary: Negative.   Musculoskeletal: Negative.   Allergic/Immunologic: Positive for environmental allergies.  Neurological: Negative.    Psychiatric/Behavioral: Negative.           Objective:   Physical Exam  Constitutional: She is oriented to person, place, and time. She appears well-developed and well-nourished.  HENT:  Head: Normocephalic.  Nose: Nose normal.  Mouth/Throat: Oropharynx is clear and moist.  Eyes: EOM are normal. Pupils are equal, round, and reactive to light.  Neck: Normal range of motion. Neck supple. No JVD present. Carotid bruit is not present. No thyromegaly present.  Cardiovascular: Normal rate, regular rhythm, normal heart sounds and intact distal pulses.   Pulmonary/Chest: Effort normal and breath sounds normal. She has no wheezes. She has no rales.  Abdominal: Soft. Bowel sounds are normal. She exhibits no mass. There is no tenderness.  Musculoskeletal: Normal range of motion.  Lymphadenopathy:    She has no cervical adenopathy.  Neurological: She is alert and oriented to person, place, and time.  Skin: Skin is warm and dry.  Psychiatric: She has a normal mood and affect. Her behavior is normal. Judgment and thought content normal.   BP 151/76  Pulse 81  Temp(Src) 97.4 F (36.3 C) (Oral)  Ht $R'5\' 1"'Re$  (1.549 m)  Wt 143 lb (64.864 kg)  BMI 27.03 kg/m2        Assessment & Plan:   1. Hypothyroidism due to acquired atrophy of thyroid   2. Hyperlipidemia   3. Asthma, chronic, mild intermittent, uncomplicated    Orders Placed This Encounter  Procedures  . CMP14+EGFR  . NMR, lipoprofile  . Thyroid Panel With TSH   Will discuss colonoscopy at next appointment Labs pending Health maintenance reviewed Diet and exercise encouraged Continue all meds Follow up  In 3 months   [place], FNP

## 2014-04-25 ENCOUNTER — Telehealth: Payer: Self-pay | Admitting: Nurse Practitioner

## 2014-04-25 ENCOUNTER — Other Ambulatory Visit: Payer: Self-pay | Admitting: Nurse Practitioner

## 2014-04-25 DIAGNOSIS — E785 Hyperlipidemia, unspecified: Secondary | ICD-10-CM

## 2014-04-25 DIAGNOSIS — E034 Atrophy of thyroid (acquired): Secondary | ICD-10-CM

## 2014-04-25 LAB — CMP14+EGFR
A/G RATIO: 1.4 (ref 1.1–2.5)
ALT: 9 IU/L (ref 0–32)
AST: 15 IU/L (ref 0–40)
Albumin: 4 g/dL (ref 3.6–4.8)
Alkaline Phosphatase: 52 IU/L (ref 39–117)
BUN / CREAT RATIO: 8 — AB (ref 11–26)
BUN: 7 mg/dL — ABNORMAL LOW (ref 8–27)
CO2: 27 mmol/L (ref 18–29)
CREATININE: 0.9 mg/dL (ref 0.57–1.00)
Calcium: 9.8 mg/dL (ref 8.7–10.3)
Chloride: 102 mmol/L (ref 97–108)
GFR, EST AFRICAN AMERICAN: 75 mL/min/{1.73_m2} (ref >59)
GFR, EST NON AFRICAN AMERICAN: 65 mL/min/{1.73_m2} (ref >59)
GLOBULIN, TOTAL: 2.8 g/dL (ref 1.5–4.5)
Glucose: 98 mg/dL (ref 65–99)
Potassium: 4.8 mmol/L (ref 3.5–5.2)
SODIUM: 140 mmol/L (ref 134–144)
Total Bilirubin: 0.5 mg/dL (ref 0.0–1.2)
Total Protein: 6.8 g/dL (ref 6.0–8.5)

## 2014-04-25 LAB — NMR, LIPOPROFILE
Cholesterol: 219 mg/dL — ABNORMAL HIGH (ref 100–199)
HDL Cholesterol by NMR: 60 mg/dL (ref >39)
HDL PARTICLE NUMBER: 33.1 umol/L (ref >=30.5)
LDL Particle Number: 1562 nmol/L — ABNORMAL HIGH (ref <1000)
LDL Size: 20.4 nm (ref >20.5)
LDLC SERPL CALC-MCNC: 128 mg/dL — ABNORMAL HIGH (ref 0–99)
SMALL LDL PARTICLE NUMBER: 872 nmol/L — AB (ref <=527)
Triglycerides by NMR: 155 mg/dL — ABNORMAL HIGH (ref 0–149)

## 2014-04-25 LAB — THYROID PANEL WITH TSH
Free Thyroxine Index: 2.2 (ref 1.2–4.9)
T3 Uptake Ratio: 28 % (ref 24–39)
T4, Total: 7.7 ug/dL (ref 4.5–12.0)
TSH: 5.47 u[IU]/mL — AB (ref 0.450–4.500)

## 2014-04-25 MED ORDER — LEVOTHYROXINE SODIUM 75 MCG PO TABS: 75.0000 ug | ORAL_TABLET | Freq: Every day | ORAL | Status: DC

## 2014-04-25 MED ORDER — ATORVASTATIN CALCIUM 40 MG PO TABS: 40.0000 mg | ORAL_TABLET | Freq: Every day | ORAL | Status: DC

## 2014-04-25 NOTE — Telephone Encounter (Signed)
Message copied by Doreatha Massed on Tue Apr 25, 2014 10:04 AM ------      Message from: [name]      Created: Tue Apr 25, 2014  9:29 AM       Kidney and liver function stable      ldl are elevated and needs to be on a statin- needs to be on lipitor- rx sent to pharmacy      TSH elevated- need to increase dose of levothyroxin- rx sent  To pharmacy- recheck thyroid labs in 6 weeks      Continue current meds- low fat diet and exercise and recheck in 3 months       ------

## 2014-05-08 ENCOUNTER — Other Ambulatory Visit: Payer: Medicare Other

## 2014-05-08 DIAGNOSIS — Z1212 Encounter for screening for malignant neoplasm of rectum: Secondary | ICD-10-CM | POA: Diagnosis not present

## 2014-05-08 NOTE — Progress Notes (Signed)
Lab only 

## 2014-05-08 NOTE — Addendum Note (Signed)
Addended by: Orma Render F on: 05/08/2014 05:45 PM   Modules accepted: Orders

## 2014-05-11 LAB — FECAL OCCULT BLOOD, IMMUNOCHEMICAL: FECAL OCCULT BLD: NEGATIVE

## 2014-05-17 ENCOUNTER — Encounter: Payer: Self-pay | Admitting: *Deleted

## 2014-06-05 ENCOUNTER — Encounter (INDEPENDENT_AMBULATORY_CARE_PROVIDER_SITE_OTHER): Payer: Self-pay

## 2014-06-05 ENCOUNTER — Ambulatory Visit (INDEPENDENT_AMBULATORY_CARE_PROVIDER_SITE_OTHER): Payer: Medicare Other

## 2014-06-05 DIAGNOSIS — Z23 Encounter for immunization: Secondary | ICD-10-CM

## 2014-06-06 ENCOUNTER — Other Ambulatory Visit (INDEPENDENT_AMBULATORY_CARE_PROVIDER_SITE_OTHER): Payer: Medicare Other

## 2014-06-06 DIAGNOSIS — E039 Hypothyroidism, unspecified: Secondary | ICD-10-CM

## 2014-06-06 NOTE — Progress Notes (Signed)
Lab only 

## 2014-06-07 LAB — THYROID PANEL WITH TSH
FREE THYROXINE INDEX: 3.3 (ref 1.2–4.9)
T3 UPTAKE RATIO: 32 % (ref 24–39)
T4, Total: 10.4 ug/dL (ref 4.5–12.0)
TSH: 0.241 u[IU]/mL — ABNORMAL LOW (ref 0.450–4.500)

## 2014-06-08 ENCOUNTER — Telehealth: Payer: Self-pay | Admitting: Nurse Practitioner

## 2014-06-08 ENCOUNTER — Other Ambulatory Visit: Payer: Self-pay | Admitting: Nurse Practitioner

## 2014-06-08 DIAGNOSIS — E034 Atrophy of thyroid (acquired): Secondary | ICD-10-CM

## 2014-06-08 MED ORDER — LEVOTHYROXINE SODIUM 50 MCG PO TABS
50.0000 ug | ORAL_TABLET | Freq: Every day | ORAL | Status: DC
Start: 2014-06-08 — End: 2015-05-25

## 2014-06-09 NOTE — Telephone Encounter (Signed)
Spoke with patient about lab results.

## 2014-07-13 ENCOUNTER — Other Ambulatory Visit: Payer: Self-pay | Admitting: Nurse Practitioner

## 2014-11-13 ENCOUNTER — Other Ambulatory Visit: Payer: Self-pay | Admitting: Family Medicine

## 2014-12-11 ENCOUNTER — Other Ambulatory Visit: Payer: Self-pay | Admitting: Family Medicine

## 2014-12-11 MED ORDER — FLUTICASONE-SALMETEROL 100-50 MCG/DOSE IN AEPB: INHALATION_SPRAY | RESPIRATORY_TRACT | Status: DC

## 2014-12-11 NOTE — Telephone Encounter (Signed)
rx called into pharmacy

## 2014-12-26 ENCOUNTER — Encounter: Payer: Self-pay | Admitting: Family Medicine

## 2014-12-26 ENCOUNTER — Ambulatory Visit (INDEPENDENT_AMBULATORY_CARE_PROVIDER_SITE_OTHER): Payer: Medicare Other | Admitting: Family Medicine

## 2014-12-26 VITALS — BP 146/85 | HR 82 | Temp 97.0°F | Ht 60.0 in | Wt 139.6 lb

## 2014-12-26 DIAGNOSIS — M8580 Other specified disorders of bone density and structure, unspecified site: Secondary | ICD-10-CM

## 2014-12-26 DIAGNOSIS — E785 Hyperlipidemia, unspecified: Secondary | ICD-10-CM | POA: Diagnosis not present

## 2014-12-26 DIAGNOSIS — Z23 Encounter for immunization: Secondary | ICD-10-CM | POA: Diagnosis not present

## 2014-12-26 DIAGNOSIS — E038 Other specified hypothyroidism: Secondary | ICD-10-CM

## 2014-12-26 DIAGNOSIS — Z780 Asymptomatic menopausal state: Secondary | ICD-10-CM | POA: Diagnosis not present

## 2014-12-26 DIAGNOSIS — E559 Vitamin D deficiency, unspecified: Secondary | ICD-10-CM | POA: Diagnosis not present

## 2014-12-26 NOTE — Progress Notes (Signed)
Subjective:  Patient ID: [name], female    DOB: 1946/05/20  Age: 69 y.o. MRN: [id number]  CC: Hypothyroidism; Hyperlipidemia; and Osteopenia   HPI [name] presents for follow-up of elevated cholesterol. Patient is working on diet control she is not taking the atorvastatin. Currently no chest pain, shortness of breath or other cardiovascular related symptoms noted.Patient presents for follow-up on  thyroid. She has a history of hypothyroidism for many years. It has been stable recently. Pt. denies any change in  voice, loss of hair, heat or cold intolerance. Energy level has been adequate to good. She denies constipation and diarrhea. No myxedema. Medication is as noted below. Verified that pt is taking it daily on an empty stomach. Well tolerated. She is also here today for follow-up on her osteopenia with a recheck of her vitamin D level  Patient states she has had asthma long-term. She uses her inhalers as noted below and has kept it under good control. She denies dyspnea on exertion or rest dyspnea no cough. History [name] has a past medical history of Asthma; Rhinitis; Thyroid disease; Menopausal state; Vitamin D deficiency disease; Osteopenia; Allergy; Hyperlipidemia; and Colon polyps.   She has past surgical history that includes Tonsillectomy and Cholecystectomy (2005).   Her family history includes Gout in her sister; Heart attack in her father; Heart disease in her father; Hyperlipidemia in her mother; Hypertension in her brother, mother, and sister; Osteoporosis in her mother; Thyroid disease in her mother.She reports that she has never smoked. She has never used smokeless tobacco. She reports that she does not drink alcohol or use illicit drugs.  Current Outpatient Prescriptions on File Prior to Visit  Medication Sig Dispense Refill  . albuterol (PROVENTIL HFA;VENTOLIN HFA) 108 (90 BASE) MCG/ACT inhaler Inhale 2 puffs into the lungs every 6 (six) hours as needed for  wheezing.    . Cholecalciferol (VITAMIN D3) 3000 UNITS TABS Take 2,000 Units by mouth daily.     . fluticasone (FLONASE) 50 MCG/ACT nasal spray Place 2 sprays into both nostrils daily. 16 g 5  . Fluticasone-Salmeterol (ADVAIR DISKUS) 100-50 MCG/DOSE AEPB INHALE ONE DOSE BY MOUTH EVERY 12 HOURS 60 each 0  . levothyroxine (SYNTHROID, LEVOTHROID) 50 MCG tablet Take 1 tablet (50 mcg total) by mouth daily. 90 tablet 3  . Multiple Vitamin (MULTIVITAMIN WITH MINERALS) TABS Take 1 tablet by mouth daily.    Marland Kitchen atorvastatin (LIPITOR) 40 MG tablet Take 1 tablet (40 mg total) by mouth daily. 30 tablet 5   Current Facility-Administered Medications on File Prior to Visit  Medication Dose Route Frequency Provider Last Rate Last Dose  . pneumococcal 13-valent conjugate vaccine (PREVNAR 13) injection 0.5 mL  0.5 mL Intramuscular Once Mary-Margaret Hassell Done, FNP        ROS Review of Systems  Constitutional: Negative for fever, chills, diaphoresis, appetite change, fatigue and unexpected weight change.  HENT: Negative for congestion, ear pain, hearing loss, postnasal drip, rhinorrhea, sneezing, sore throat and trouble swallowing.   Eyes: Negative for pain.  Respiratory: Negative for cough, chest tightness and shortness of breath.   Cardiovascular: Negative for chest pain and palpitations.  Gastrointestinal: Negative for nausea, vomiting, abdominal pain, diarrhea and constipation.  Genitourinary: Negative for dysuria, frequency and menstrual problem.  Musculoskeletal: Negative for joint swelling and arthralgias.  Skin: Negative for rash.  Neurological: Negative for dizziness, weakness, numbness and headaches.  Psychiatric/Behavioral: Negative for dysphoric mood and agitation.    Objective:  BP 146/85 mmHg  Pulse 82  Temp(Src) 97 F (36.1 C) (Oral)  Ht 5' (1.524 m)  Wt 139 lb 9.6 oz (63.322 kg)  BMI 27.26 kg/m2  BP Readings from Last 3 Encounters:  12/26/14 146/85  04/24/14 136/88  09/26/13 132/80     Wt Readings from Last 3 Encounters:  12/26/14 139 lb 9.6 oz (63.322 kg)  04/24/14 143 lb (64.864 kg)  09/26/13 137 lb (62.143 kg)     Physical Exam  Constitutional: She is oriented to person, place, and time. She appears well-developed and well-nourished. No distress.  HENT:  Head: Normocephalic and atraumatic.  Right Ear: External ear normal.  Left Ear: External ear normal.  Nose: Nose normal.  Mouth/Throat: Oropharynx is clear and moist.  Eyes: Conjunctivae and EOM are normal. Pupils are equal, round, and reactive to light.  Neck: Normal range of motion. Neck supple. No thyromegaly present.  Cardiovascular: Normal rate, regular rhythm and normal heart sounds.   No murmur heard. Pulmonary/Chest: Effort normal and breath sounds normal. No respiratory distress. She has no wheezes. She has no rales.  Lymphadenopathy:    She has no cervical adenopathy.  Neurological: She is alert and oriented to person, place, and time. She has normal reflexes.  Skin: Skin is warm and dry.  Psychiatric: She has a normal mood and affect. Her behavior is normal. Judgment and thought content normal.    No results found for: HGBA1C  Lab Results  Component Value Date   GLUCOSE 98 04/24/2014   CHOL 219* 04/24/2014   TRIG 155* 04/24/2014   HDL 60 04/24/2014   LDLCALC 128* 04/24/2014   ALT 9 04/24/2014   AST 15 04/24/2014   NA 140 04/24/2014   K 4.8 04/24/2014   CL 102 04/24/2014   CREATININE 0.90 04/24/2014   BUN 7* 04/24/2014   CO2 27 04/24/2014   TSH 0.241* 06/06/2014    No results found.  Assessment & Plan:   [name] was seen today for hypothyroidism, hyperlipidemia and osteopenia.  Diagnoses and all orders for this visit:  Other specified hypothyroidism Orders: -     Thyroid Panel With TSH -     CBC with Differential/Platelet  Hyperlipidemia Orders: -     CMP14+EGFR; Standing -     NMR, lipoprofile -     Lipid panel; Standing -     CMP14+EGFR -     CBC with  Differential/Platelet  Osteopenia Orders: -     Cancel: POCT CBC; Standing -     POCT CBC -     CBC with Differential/Platelet  Postmenopausal Orders: -     Cancel: POCT CBC; Standing -     Vit D  25 hydroxy (rtn osteoporosis monitoring) -     POCT CBC -     CBC with Differential/Platelet   I am having Ms. Peach maintain her multivitamin with minerals, albuterol, Vitamin D3, fluticasone, atorvastatin, levothyroxine, and Fluticasone-Salmeterol. We will continue to administer pneumococcal 13-valent conjugate vaccine.  No orders of the defined types were placed in this encounter.     Follow-up: No Follow-up on file.  [name], M.D.

## 2014-12-27 LAB — CBC WITH DIFFERENTIAL/PLATELET
BASOS: 0 %
Basophils Absolute: 0 10*3/uL (ref 0.0–0.2)
EOS (ABSOLUTE): 0.1 10*3/uL (ref 0.0–0.4)
EOS: 2 %
Hematocrit: 42.2 % (ref 34.0–46.6)
Hemoglobin: 13.9 g/dL (ref 11.1–15.9)
IMMATURE GRANS (ABS): 0 10*3/uL (ref 0.0–0.1)
IMMATURE GRANULOCYTES: 0 %
LYMPHS: 19 %
Lymphocytes Absolute: 0.7 10*3/uL (ref 0.7–3.1)
MCH: 30 pg (ref 26.6–33.0)
MCHC: 32.9 g/dL (ref 31.5–35.7)
MCV: 91 fL (ref 79–97)
Monocytes Absolute: 0.3 10*3/uL (ref 0.1–0.9)
Monocytes: 7 %
Neutrophils Absolute: 2.6 10*3/uL (ref 1.4–7.0)
Neutrophils: 72 %
PLATELETS: 254 10*3/uL (ref 150–379)
RBC: 4.64 x10E6/uL (ref 3.77–5.28)
RDW: 13.8 % (ref 12.3–15.4)
WBC: 3.7 10*3/uL (ref 3.4–10.8)

## 2014-12-27 LAB — CMP14+EGFR
ALK PHOS: 50 IU/L (ref 39–117)
ALT: 9 IU/L (ref 0–32)
AST: 12 IU/L (ref 0–40)
Albumin/Globulin Ratio: 1.4 (ref 1.1–2.5)
Albumin: 4.1 g/dL (ref 3.5–4.8)
BUN/Creatinine Ratio: 10 — ABNORMAL LOW (ref 11–26)
BUN: 9 mg/dL (ref 8–27)
Bilirubin Total: 0.6 mg/dL (ref 0.0–1.2)
CO2: 26 mmol/L (ref 18–29)
CREATININE: 0.91 mg/dL (ref 0.57–1.00)
Calcium: 10.3 mg/dL (ref 8.7–10.3)
Chloride: 101 mmol/L (ref 97–108)
GFR, EST AFRICAN AMERICAN: 74 mL/min/{1.73_m2} (ref >59)
GFR, EST NON AFRICAN AMERICAN: 64 mL/min/{1.73_m2} (ref >59)
Globulin, Total: 2.9 g/dL (ref 1.5–4.5)
Glucose: 96 mg/dL (ref 65–99)
Potassium: 5.1 mmol/L (ref 3.5–5.2)
SODIUM: 139 mmol/L (ref 134–144)
Total Protein: 7 g/dL (ref 6.0–8.5)

## 2014-12-27 LAB — THYROID PANEL WITH TSH
Free Thyroxine Index: 2.5 (ref 1.2–4.9)
T3 UPTAKE RATIO: 28 % (ref 24–39)
T4, Total: 8.9 ug/dL (ref 4.5–12.0)
TSH: 3.42 u[IU]/mL (ref 0.450–4.500)

## 2014-12-27 LAB — NMR, LIPOPROFILE
Cholesterol: 209 mg/dL — ABNORMAL HIGH (ref 100–199)
HDL Cholesterol by NMR: 60 mg/dL (ref >39)
HDL Particle Number: 37 umol/L (ref >=30.5)
LDL PARTICLE NUMBER: 1441 nmol/L — AB (ref <1000)
LDL Size: 20.9 nm (ref >20.5)
LDL-C: 121 mg/dL — AB (ref 0–99)
LP-IR SCORE: 27 (ref <=45)
Small LDL Particle Number: 497 nmol/L (ref <=527)
Triglycerides by NMR: 141 mg/dL (ref 0–149)

## 2014-12-27 LAB — VITAMIN D 25 HYDROXY (VIT D DEFICIENCY, FRACTURES): Vit D, 25-Hydroxy: 39.5 ng/mL (ref 30.0–100.0)

## 2015-01-10 ENCOUNTER — Other Ambulatory Visit: Payer: Self-pay | Admitting: Nurse Practitioner

## 2015-03-07 ENCOUNTER — Ambulatory Visit: Payer: Self-pay

## 2015-03-09 ENCOUNTER — Ambulatory Visit (INDEPENDENT_AMBULATORY_CARE_PROVIDER_SITE_OTHER): Payer: Medicare Other | Admitting: Pharmacist

## 2015-03-09 ENCOUNTER — Encounter: Payer: Self-pay | Admitting: Pharmacist

## 2015-03-09 VITALS — BP 136/80 | HR 72 | Ht 60.0 in | Wt 143.0 lb

## 2015-03-09 DIAGNOSIS — Z0000 Encounter for general adult medical examination without abnormal findings: Secondary | ICD-10-CM

## 2015-03-09 DIAGNOSIS — M8580 Other specified disorders of bone density and structure, unspecified site: Secondary | ICD-10-CM

## 2015-03-09 NOTE — Patient Instructions (Addendum)
[name] , Thank you for taking time to come for your Medicare Wellness Visit. I appreciate your ongoing commitment to your health goals. Please review the following plan we discussed and let me know if I can assist you in the future.    Continue with walking daily when weather permits.     This is a list of the screening recommended for you and due dates:  Health Maintenance  Topic Date Due  . Mammogram  02/08/2015  . Colon Cancer Screening  03/28/2015*  . Shingles Vaccine  03/28/2015*  . Flu Shot  03/26/2015  . DEXA scan (bone density measurement)  04/28/2015  . Stool Blood Test  05/09/2015  . Tetanus Vaccine  08/25/2017  . Pneumonia vaccines  Completed  *Topic was postponed. The date shown is not the original due date.      DASH Eating Plan DASH stands for "Dietary Approaches to Stop Hypertension." The DASH eating plan is a healthy eating plan that has been shown to reduce high blood pressure (hypertension). Additional health benefits may include reducing the risk of type 2 diabetes mellitus, heart disease, and stroke. The DASH eating plan may also help with weight loss. WHAT DO I NEED TO KNOW ABOUT THE DASH EATING PLAN? For the DASH eating plan, you will follow these general guidelines:  Choose foods with a percent daily value for sodium of less than 5% (as listed on the food label).  Use salt-free seasonings or herbs instead of table salt or sea salt.  Check with your health care provider or pharmacist before using salt substitutes.  Eat lower-sodium products, often labeled as "lower sodium" or "no salt added."  Eat fresh foods.  Eat more vegetables, fruits, and low-fat dairy products.  Choose whole grains. Look for the word "whole" as the first word in the ingredient list.  Choose fish and skinless chicken or [place] more often than red meat. Limit fish, poultry, and meat to 6 oz (170 g) each day.  Limit sweets, desserts, sugars, and sugary drinks.  Choose  heart-healthy fats.  Limit cheese to 1 oz (28 g) per day.  Eat more home-cooked food and less restaurant, buffet, and fast food.  Limit fried foods.  Cook foods using methods other than frying.  Limit canned vegetables. If you do use them, rinse them well to decrease the sodium.  When eating at a restaurant, ask that your food be prepared with less salt, or no salt if possible. WHAT FOODS CAN I EAT? Seek help from a dietitian for individual calorie needs. Grains Whole grain or whole wheat bread. Brown rice. Whole grain or whole wheat pasta. Quinoa, bulgur, and whole grain cereals. Low-sodium cereals. Corn or whole wheat flour tortillas. Whole grain cornbread. Whole grain crackers. Low-sodium crackers. Vegetables Fresh or frozen vegetables (raw, steamed, roasted, or grilled). Low-sodium or reduced-sodium tomato and vegetable juices. Low-sodium or reduced-sodium tomato sauce and paste. Low-sodium or reduced-sodium canned vegetables.  Fruits All fresh, canned (in natural juice), or frozen fruits. Meat and Other Protein Products Ground beef (85% or leaner), grass-fed beef, or beef trimmed of fat. Skinless chicken or [place]. Ground chicken or [place]. Pork trimmed of fat. All fish and seafood. Eggs. Dried beans, peas, or lentils. Unsalted nuts and seeds. Unsalted canned beans. Dairy Low-fat dairy products, such as skim or 1% milk, 2% or reduced-fat cheeses, low-fat ricotta or cottage cheese, or plain low-fat yogurt. Low-sodium or reduced-sodium cheeses. Fats and Oils Tub margarines without trans fats. Light or reduced-fat mayonnaise and  salad dressings (reduced sodium). Avocado. Safflower, olive, or canola oils. Natural peanut or almond butter. Other Unsalted popcorn and pretzels. The items listed above may not be a complete list of recommended foods or beverages. Contact your dietitian for more options. WHAT FOODS ARE NOT RECOMMENDED? Grains White bread. White pasta. White rice. Refined  cornbread. Bagels and croissants. Crackers that contain trans fat. Vegetables Creamed or fried vegetables. Vegetables in a cheese sauce. Regular canned vegetables. Regular canned tomato sauce and paste. Regular tomato and vegetable juices. Fruits Dried fruits. Canned fruit in light or heavy syrup. Fruit juice. Meat and Other Protein Products Fatty cuts of meat. Ribs, chicken wings, bacon, sausage, bologna, salami, chitterlings, fatback, hot dogs, bratwurst, and packaged luncheon meats. Salted nuts and seeds. Canned beans with salt. Dairy Whole or 2% milk, cream, half-and-half, and cream cheese. Whole-fat or sweetened yogurt. Full-fat cheeses or blue cheese. Nondairy creamers and whipped toppings. Processed cheese, cheese spreads, or cheese curds. Condiments Onion and garlic salt, seasoned salt, table salt, and sea salt. Canned and packaged gravies. Worcestershire sauce. Tartar sauce. Barbecue sauce. Teriyaki sauce. Soy sauce, including reduced sodium. Steak sauce. Fish sauce. Oyster sauce. Cocktail sauce. Horseradish. Ketchup and mustard. Meat flavorings and tenderizers. Bouillon cubes. Hot sauce. Tabasco sauce. Marinades. Taco seasonings. Relishes. Fats and Oils Butter, stick margarine, lard, shortening, ghee, and bacon fat. Coconut, palm kernel, or palm oils. Regular salad dressings. Other Pickles and olives. Salted popcorn and pretzels. The items listed above may not be a complete list of foods and beverages to avoid. Contact your dietitian for more information. WHERE CAN I FIND MORE INFORMATION? National Heart, Lung, and Blood Institute: CableProm[employer].itwww.nhlbi.nih.gov/health/health-topics/topics/dash/ Document Released: 07/31/2011 Document Revised: 12/26/2013 Document Reviewed: 06/15/2013 [facility] Patient Information 2015 [place], [place]. This information is not intended to replace advice given to you by your health care provider. Make sure you discuss any questions you have with your health care  provider.

## 2015-03-09 NOTE — Progress Notes (Signed)
Patient ID: [name], female   DOB: 1946/04/21, 69 y.o.   MRN: [id number]    Subjective:   [name] is a 69 y.o. female who presents for a subsequent Medicare Annual Wellness Visit.  [name] is retired and lives with her 96yo mother.  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   She does not express any health concerns today but does ask if it would be OK for her to use metamucil daily to bowel health / regularity.  She did have an elevated BP reading at her last visit 12/26/2014 which was 146/85.  She reports that she has not had elevated readings in past.    Current Medications (verified) Outpatient Encounter Prescriptions as of 03/09/2015  Medication Sig  . albuterol (PROVENTIL HFA;VENTOLIN HFA) 108 (90 BASE) MCG/ACT inhaler Inhale 2 puffs into the lungs every 6 (six) hours as needed for wheezing.  . Cholecalciferol (VITAMIN D3) 3000 UNITS TABS Take 2,000 Units by mouth daily.   . fluticasone (FLONASE) 50 MCG/ACT nasal spray USE TWO SPRAY(S) IN EACH NOSTRIL ONCE DAILY  . Fluticasone-Salmeterol (ADVAIR DISKUS) 100-50 MCG/DOSE AEPB Inhale 1 puff into the lungs 2 (two) times daily.  Marland Kitchen levothyroxine (SYNTHROID, LEVOTHROID) 50 MCG tablet Take 1 tablet (50 mcg total) by mouth daily.  . Multiple Vitamin (MULTIVITAMIN WITH MINERALS) TABS Take 1 tablet by mouth daily.  . [DISCONTINUED] atorvastatin (LIPITOR) 40 MG tablet Take 1 tablet (40 mg total) by mouth daily. (Patient not taking: Reported on 03/09/2015)  . [DISCONTINUED] pneumococcal 13-valent conjugate vaccine (PREVNAR 13) injection 0.5 mL    No facility-administered encounter medications on file as of 03/09/2015.    Allergies (verified) Fosamax; Red dye; and Sulfa antibiotics   History: Past Medical History  Diagnosis Date  . Asthma   . Rhinitis   . Thyroid disease   . Menopausal state   . Vitamin D deficiency disease   . Osteopenia   . Allergy     seasonal  . Hyperlipidemia   . Colon polyps   . Cataract     Past Surgical History  Procedure Laterality Date  . Tonsillectomy    . Cholecystectomy  2005   Family History  Problem Relation Age of Onset  . Hyperlipidemia Mother   . Hypertension Mother   . Thyroid disease Mother   . Osteoporosis Mother   . Heart disease Father   . Heart attack Father     heavy smoker  . Hypertension Sister   . Gout Sister   . Hypertension Brother   . Asthma Brother    Social History   Occupational History  . Not on file.   Social History Main Topics  . Smoking status: Never Smoker   . Smokeless tobacco: Never Used  . Alcohol Use: No  . Drug Use: No  . Sexual Activity: No    Do you feel safe at home?  Yes  Dietary issues and exercise activities: Current Exercise Habits:: Home exercise routine, Type of exercise: walking, Time (Minutes): 20, Frequency (Times/Week): 4, Weekly Exercise (Minutes/Week): 80, Intensity: Moderate  Current Dietary habits:  Not following any special dietary recommendations  Objective:    Today's Vitals   03/09/15 0950  BP: 136/80  Pulse: 72  Height: 5' (1.524 m)  Weight: 143 lb (64.864 kg)  PainSc: 0-No pain   Body mass index is 27.93 kg/(m^2).  Activities of Daily Living In your present state of health, do you have any difficulty performing the following activities: 03/09/2015  Hearing? N  Vision? N  Difficulty concentrating or making decisions? N  Walking or climbing stairs? N  Dressing or bathing? N  Doing errands, shopping? N  Preparing Food and eating ? N  Using the Toilet? N  In the past six months, have you accidently leaked urine? N  Do you have problems with loss of bowel control? N  Managing your Medications? N  Managing your Finances? N  Housekeeping or managing your Housekeeping? N    Are there smokers in your home (other than you)? No   Cardiac Risk Factors include: advanced age (>4655men, 16>65 women);dyslipidemia;family history of premature cardiovascular disease  Depression Screen PHQ  2/9 Scores 03/09/2015 12/26/2014 04/24/2014 09/26/2013  PHQ - 2 Score 0 0 0 0    Fall Risk Fall Risk  03/09/2015 12/26/2014 04/24/2014 09/26/2013 09/01/2013  Falls in the past year? No No No No No  Risk for fall due to : - - - - Impaired vision    Cognitive Function: MMSE - Mini Mental State Exam 03/09/2015  Orientation to time 5  Orientation to Place 5  Registration 3  Attention/ Calculation 5  Recall 3  Language- name 2 objects 2  Language- repeat 1  Language- follow 3 step command 3  Language- read & follow direction 1  Write a sentence 1  Copy design 0  Total score 29    Immunizations and Health Maintenance Immunization History  Administered Date(s) Administered  . Influenza,inj,Quad PF,36+ Mos 06/10/2013, 06/05/2014  . Pneumococcal Conjugate-13 12/26/2014  . Pneumococcal Polysaccharide-23 11/22/2012   Health Maintenance Due  Topic Date Due  . MAMMOGRAM  02/08/2015    Patient Care Team: Mechele ClaudeWarren Stacks, MD as PCP - General (Family Medicine) Ernesto Rutherfordobert Groat, MD as Consulting Physician (Ophthalmology)  Indicate any recent Medical Services you may have received from other than [facility] providers in the past year (date may be approximate).    Assessment:    Annual Wellness Visit    Screening Tests Health Maintenance  Topic Date Due  . MAMMOGRAM  02/08/2015  . COLONOSCOPY  03/28/2015 (Originally 10/20/2012)  . ZOSTAVAX  03/28/2015 (Originally 05/14/2004)  . INFLUENZA VACCINE  03/26/2015  . DEXA SCAN  04/28/2015  . COLON CANCER SCREENING ANNUAL FOBT  05/09/2015  . TETANUS/TDAP  08/25/2017  . PNA vac Low Risk Adult  Completed        Plan:   During the course of the visit [name] was educated and counseled about the following appropriate screening and preventive services:   Vaccines to include Pneumoccal, Influenza, Hepatitis B, Td, Zostavax - checked on price of Zostavax - was $120 - patient declined.  Also trying to verify if tetanus vaccines she received in 2009 was Boostrix  (tDAP)  Colorectal cancer screening - she is due colonoscopy q5 years per patient and she is past due.  FOBT UTD  Cardiovascular disease screening - consider EKG at next visit.  Patient has stopped statin.  She is concerned about side effects.  Due to have lipids rechecked at next appt 06/2015  Diabetes screening - UTD  Bone Denisty / Osteoporosis Screening - next due 04/28/2015 or after  Mammogram - appt made in office today for 03/2015  Glaucoma screening / Diabetic Eye Exam - UTD  Nutrition counseling - Discussed DASH diet and its benefits for BP control.    Continue to walk daily - discussed benefit for BP, weight and heart.  Advanced Directives - handout given in office today  OK to take metamucil or increase  high fiber foods.   Patient Instructions (the written plan) were given to the patient.   Henrene Pastor, PHARMD, CPP, CDE  03/09/2015

## 2015-04-23 DIAGNOSIS — Z1231 Encounter for screening mammogram for malignant neoplasm of breast: Secondary | ICD-10-CM | POA: Diagnosis not present

## 2015-05-02 ENCOUNTER — Other Ambulatory Visit: Payer: Self-pay | Admitting: Pharmacist

## 2015-05-02 ENCOUNTER — Other Ambulatory Visit: Payer: Medicare Other

## 2015-05-02 ENCOUNTER — Ambulatory Visit (INDEPENDENT_AMBULATORY_CARE_PROVIDER_SITE_OTHER): Payer: Medicare Other | Admitting: Pharmacist

## 2015-05-02 ENCOUNTER — Encounter: Payer: Self-pay | Admitting: Pharmacist

## 2015-05-02 VITALS — Ht 61.5 in | Wt 141.0 lb

## 2015-05-02 DIAGNOSIS — M899 Disorder of bone, unspecified: Secondary | ICD-10-CM

## 2015-05-02 DIAGNOSIS — M8580 Other specified disorders of bone density and structure, unspecified site: Secondary | ICD-10-CM

## 2015-05-02 NOTE — Progress Notes (Signed)
Patient ID: [name], female   DOB: Sep 22, 1945, 69 y.o.   MRN: [id number] [facility] Current Height: Height: 5' 1.5" (156.2 cm)      Max Lifetime Height:  Current Weight: Weight: 141 lb (63.957 kg)       Ethnicity:Caucasian    HPI: Does pt already have a diagnosis of:  Osteopenia?  Yes Osteoporosis?  No  Back Pain?  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  No Med(s) for Osteoporosis/Osteopenia:  none Med(s) previously tried for Osteoporosis/Osteopenia:  actonel  monthly - insurance didn't cover, alendronate  weekly - caused bone pain.                                                             PMH: Age at menopause:  69 yo Hysterectomy?  No Oophorectomy?  No HRT? No Steroid Use?  Yes - inhaled steroid / advair Thyroid med?  Yes  History of cancer?  No History of digestive disorders (ie Crohn's)?  No but she is lactose intolerant Current or previous eating disorders?  No Last Vitamin D Result:  39.5 (12/26/2014) Last GFR Result: 64 (12/26/2014)   FH/SH: Family history of osteoporosis?  Yes - mother and maternal aunts Parent with history of hip fracture?  No But brother broke hip July 2016 - accident with lawnmower Family history of breast cancer?  No Exercise?  No - she was walking but stopped in May Smoking?  No Alcohol?  No    Calcium Assessment Calcium Intake  # of servings/day  Calcium mg  Milk (8 oz) 0  x  300  = 0  Yogurt (4 oz) 0 x  200 = 0  Cheese (1 oz) 0 x  200 = 0  Other Calcium sources     Ca supplement MVI =    Estimated calcium intake per day     DEXA Results Date of Test T-Score for AP Spine L1-L4 T-Score for Total Left Hip T-Score for Total Right Hip  05/02/2015 -0.6 -0.7 -0.7  04/27/2013 -0.4 -0.8 -0.3  04/16/2011 -0.4 -0.7 -0.4  12/27/2008 -0.7 -0.7 -0.2  12/21/2006 -0.4 -0.7 -0.1  ** T-Score for neck of left hip was -1.7 today 05/02/2015**  FRAX 10 year estimate: Total FX risk:  11%   (consider medication if >/= 20%) Hip FX risk:  1.7%  (consider medication if >/= 3%)  Assessment: Osteopenia - decreased BMD, low fracture risk per FRAX estimate   Recommendations: 1.  Discussed DEXA results and fracture risk 2.  recommend calcium  daily through supplementation or diet.  3.  Restart weight bearing exercise - 30 minutes at least 4 days per week.   4.  Counseled and educated about fall risk and prevention.   Recheck DEXA:  2 years  Time spent counseling patient:  30 minutes   Henrene Pastor, PharmD, CPP

## 2015-05-25 ENCOUNTER — Other Ambulatory Visit: Payer: Self-pay | Admitting: Nurse Practitioner

## 2015-06-05 ENCOUNTER — Ambulatory Visit (INDEPENDENT_AMBULATORY_CARE_PROVIDER_SITE_OTHER): Payer: Medicare Other

## 2015-06-05 DIAGNOSIS — Z23 Encounter for immunization: Secondary | ICD-10-CM

## 2015-06-28 ENCOUNTER — Ambulatory Visit (INDEPENDENT_AMBULATORY_CARE_PROVIDER_SITE_OTHER): Payer: Medicare Other | Admitting: Family Medicine

## 2015-06-28 ENCOUNTER — Encounter: Payer: Self-pay | Admitting: Family Medicine

## 2015-06-28 VITALS — BP 140/79 | HR 86 | Temp 97.5°F | Ht 62.0 in | Wt 140.8 lb

## 2015-06-28 DIAGNOSIS — E039 Hypothyroidism, unspecified: Secondary | ICD-10-CM | POA: Diagnosis not present

## 2015-06-28 DIAGNOSIS — E785 Hyperlipidemia, unspecified: Secondary | ICD-10-CM | POA: Diagnosis not present

## 2015-06-28 DIAGNOSIS — M8580 Other specified disorders of bone density and structure, unspecified site: Secondary | ICD-10-CM

## 2015-06-28 DIAGNOSIS — J4540 Moderate persistent asthma, uncomplicated: Secondary | ICD-10-CM

## 2015-06-28 LAB — PULMONARY FUNCTION TEST

## 2015-06-28 MED ORDER — ALBUTEROL SULFATE HFA 108 (90 BASE) MCG/ACT IN AERS: 2.0000 | INHALATION_SPRAY | Freq: Four times a day (QID) | RESPIRATORY_TRACT | Status: DC | PRN

## 2015-06-28 MED ORDER — DENOSUMAB 60 MG/ML ~~LOC~~ SOLN: 60.0000 mg | SUBCUTANEOUS | Status: DC

## 2015-06-28 MED ORDER — LEVOTHYROXINE SODIUM 50 MCG PO TABS: 50.0000 ug | ORAL_TABLET | Freq: Every day | ORAL | Status: DC

## 2015-06-28 MED ORDER — FLUTICASONE PROPIONATE 50 MCG/ACT NA SUSP: NASAL | Status: DC

## 2015-06-28 MED ORDER — FLUTICASONE-SALMETEROL 100-50 MCG/DOSE IN AEPB: 1.0000 | INHALATION_SPRAY | Freq: Two times a day (BID) | RESPIRATORY_TRACT | Status: DC

## 2015-06-28 NOTE — Progress Notes (Signed)
Subjective:  Patient ID: [name], female    DOB: Mar 04, 1946  Age: 69 y.o. MRN: [id number]  CC: Hypothyroidism   HPI TIEGAN JAMBOR presents for Patient presents for follow-up on  thyroid. She has a history of hypothyroidism for many years. It has been stable recently. Pt. denies any change in  voice, loss of hair, heat or cold intolerance. Energy level has been adequate to good. She denies constipation and diarrhea. No myxedema. Medication is as noted below. Verified that pt is taking it daily on an empty stomach. Well tolerated.  Although she has been diagnosed with elevated cholesterol she is not taking medication for this. She continues to work on dietary control.  Patient also has history of osteopenia and is due for bone density testing. She takes calcium and vitamin D but no SERM or other agent.  History Emaleigh has a past medical history of Asthma; Rhinitis; Thyroid disease; Menopausal state; Vitamin D deficiency disease; Osteopenia; Allergy; Hyperlipidemia; Colon polyps; and Cataract.   She has past surgical history that includes Tonsillectomy and Cholecystectomy (2005).   Her family history includes Asthma in her brother; Gout in her sister; Heart attack in her father; Heart disease in her father; Hyperlipidemia in her mother; Hypertension in her brother, mother, and sister; Osteoporosis in her mother; Thyroid disease in her mother.She reports that she has never smoked. She has never used smokeless tobacco. She reports that she does not drink alcohol or use illicit drugs.  Outpatient Prescriptions Prior to Visit  Medication Sig Dispense Refill  . Cholecalciferol (VITAMIN D3) 3000 UNITS TABS Take 2,000 Units by mouth daily.     . Multiple Vitamin (MULTIVITAMIN WITH MINERALS) TABS Take 1 tablet by mouth daily.    . fluticasone (FLONASE) 50 MCG/ACT nasal spray USE TWO SPRAY(S) IN EACH NOSTRIL ONCE DAILY 16 g 5  . Fluticasone-Salmeterol (ADVAIR DISKUS) 100-50 MCG/DOSE AEPB Inhale 1  puff into the lungs 2 (two) times daily. 60 each 5  . levothyroxine (SYNTHROID, LEVOTHROID) 50 MCG tablet TAKE ONE TABLET BY MOUTH ONCE DAILY 90 tablet 1  . albuterol (PROVENTIL HFA;VENTOLIN HFA) 108 (90 BASE) MCG/ACT inhaler Inhale 2 puffs into the lungs every 6 (six) hours as needed for wheezing.     No facility-administered medications prior to visit.    ROS Review of Systems  Constitutional: Negative for fever, activity change and appetite change.  HENT: Negative for congestion, rhinorrhea and sore throat.   Eyes: Negative for pain and visual disturbance.  Respiratory: Negative for cough and shortness of breath.   Gastrointestinal: Negative for nausea and abdominal pain.  Musculoskeletal: Negative for myalgias and arthralgias.    Objective:  BP 140/79 mmHg  Pulse 86  Temp(Src) 97.5 F (36.4 C) (Oral)  Ht 5' 2"  (1.575 m)  Wt 140 lb 12.8 oz (63.866 kg)  BMI 25.75 kg/m2  SpO2 98%  BP Readings from Last 3 Encounters:  06/28/15 140/79  03/09/15 136/80  12/26/14 146/85    Wt Readings from Last 3 Encounters:  06/28/15 140 lb 12.8 oz (63.866 kg)  05/02/15 141 lb (63.957 kg)  03/09/15 143 lb (64.864 kg)     Physical Exam  Constitutional: She is oriented to person, place, and time. She appears well-developed and well-nourished. No distress.  HENT:  Head: Normocephalic and atraumatic.  Eyes: Conjunctivae are normal. Pupils are equal, round, and reactive to light.  Neck: Normal range of motion. Neck supple. No thyromegaly present.  Cardiovascular: Normal rate, regular rhythm and normal heart sounds.  No murmur heard. Pulmonary/Chest: Effort normal and breath sounds normal. No respiratory distress. She has no wheezes. She has no rales.  Abdominal: Soft. Bowel sounds are normal. She exhibits no distension. There is no tenderness.  Musculoskeletal: Normal range of motion.  Lymphadenopathy:    She has no cervical adenopathy.  Neurological: She is alert and oriented to  person, place, and time.  Skin: Skin is warm and dry.  Psychiatric: She has a normal mood and affect. Her behavior is normal. Judgment and thought content normal.    No results found for: HGBA1C  Lab Results  Component Value Date   WBC 4.1 06/28/2015   HCT 41.3 06/28/2015   GLUCOSE 90 06/28/2015   CHOL 200* 06/28/2015   TRIG 125 06/28/2015   HDL 63 06/28/2015   LDLCALC 112* 06/28/2015   ALT 11 06/28/2015   AST 15 06/28/2015   NA 140 06/28/2015   K 5.0 06/28/2015   CL 99 06/28/2015   CREATININE 0.84 06/28/2015   BUN 7* 06/28/2015   CO2 25 06/28/2015   TSH 6.060* 06/28/2015    No results found.  Assessment & Plan:   Mathea was seen today for hypothyroidism.  Diagnoses and all orders for this visit:  Asthma, chronic, moderate persistent, uncomplicated -     CBC with Differential/Platelet -     TSH -     T4, Free -     Fluticasone-Salmeterol (ADVAIR DISKUS) 100-50 MCG/DOSE AEPB; Inhale 1 puff into the lungs 2 (two) times daily. -     albuterol (PROVENTIL HFA;VENTOLIN HFA) 108 (90 BASE) MCG/ACT inhaler; Inhale 2 puffs into the lungs every 6 (six) hours as needed for wheezing. -     PR BREATHING CAPACITY TEST  Hypothyroidism, unspecified hypothyroidism type -     CBC with Differential/Platelet -     TSH -     T4, Free -     Discontinue: levothyroxine (SYNTHROID, LEVOTHROID) 50 MCG tablet; Take 1 tablet (50 mcg total) by mouth daily.  Osteopenia -     CBC with Differential/Platelet -     TSH -     T4, Free  Hyperlipidemia -     CMP14+EGFR -     Lipid panel -     CBC with Differential/Platelet -     TSH -     T4, Free  Other orders -     denosumab (PROLIA) 60 MG/ML SOLN injection; Inject 60 mg into the skin every 6 (six) months. Administer in upper arm, thigh, or abdomen -     fluticasone (FLONASE) 50 MCG/ACT nasal spray; USE TWO SPRAY(S) IN EACH NOSTRIL ONCE DAILY  I have discontinued Ms. Metivier's levothyroxine. I am also having her start on denosumab.  Additionally, I am having her maintain her multivitamin with minerals, Vitamin D3, Fluticasone-Salmeterol, albuterol, and fluticasone.  Meds ordered this encounter  Medications  . denosumab (PROLIA) 60 MG/ML SOLN injection    Sig: Inject 60 mg into the skin every 6 (six) months. Administer in upper arm, thigh, or abdomen    Dispense:  1 Syringe    Refill:  1  . Fluticasone-Salmeterol (ADVAIR DISKUS) 100-50 MCG/DOSE AEPB    Sig: Inhale 1 puff into the lungs 2 (two) times daily.    Dispense:  60 each    Refill:  5  . albuterol (PROVENTIL HFA;VENTOLIN HFA) 108 (90 BASE) MCG/ACT inhaler    Sig: Inhale 2 puffs into the lungs every 6 (six) hours as needed for wheezing.  Dispense:  1 Inhaler    Refill:  11  . DISCONTD: levothyroxine (SYNTHROID, LEVOTHROID) 50 MCG tablet    Sig: Take 1 tablet (50 mcg total) by mouth daily.    Dispense:  90 tablet    Refill:  1  . fluticasone (FLONASE) 50 MCG/ACT nasal spray    Sig: USE TWO SPRAY(S) IN EACH NOSTRIL ONCE DAILY    Dispense:  16 g    Refill:  11     Follow-up: Return in about 6 months (around 12/26/2015) for CPE, HypothyroidismAsthma.  [name], M.D.

## 2015-06-29 LAB — CBC WITH DIFFERENTIAL/PLATELET
Basophils Absolute: 0 10*3/uL (ref 0.0–0.2)
Basos: 1 %
EOS (ABSOLUTE): 0.1 10*3/uL (ref 0.0–0.4)
Eos: 3 %
HEMATOCRIT: 41.3 % (ref 34.0–46.6)
Hemoglobin: 13.3 g/dL (ref 11.1–15.9)
Immature Grans (Abs): 0 10*3/uL (ref 0.0–0.1)
Immature Granulocytes: 0 %
LYMPHS ABS: 0.8 10*3/uL (ref 0.7–3.1)
Lymphs: 20 %
MCH: 29.8 pg (ref 26.6–33.0)
MCHC: 32.2 g/dL (ref 31.5–35.7)
MCV: 92 fL (ref 79–97)
MONOS ABS: 0.3 10*3/uL (ref 0.1–0.9)
Monocytes: 7 %
Neutrophils Absolute: 2.8 10*3/uL (ref 1.4–7.0)
Neutrophils: 69 %
Platelets: 270 10*3/uL (ref 150–379)
RBC: 4.47 x10E6/uL (ref 3.77–5.28)
RDW: 13.9 % (ref 12.3–15.4)
WBC: 4.1 10*3/uL (ref 3.4–10.8)

## 2015-06-29 LAB — CMP14+EGFR
ALBUMIN: 4.1 g/dL (ref 3.5–4.8)
ALT: 11 IU/L (ref 0–32)
AST: 15 IU/L (ref 0–40)
Albumin/Globulin Ratio: 1.4 (ref 1.1–2.5)
Alkaline Phosphatase: 52 IU/L (ref 39–117)
BUN / CREAT RATIO: 8 — AB (ref 11–26)
BUN: 7 mg/dL — ABNORMAL LOW (ref 8–27)
Bilirubin Total: 0.6 mg/dL (ref 0.0–1.2)
CO2: 25 mmol/L (ref 18–29)
CREATININE: 0.84 mg/dL (ref 0.57–1.00)
Calcium: 10.3 mg/dL (ref 8.7–10.3)
Chloride: 99 mmol/L (ref 97–106)
GFR calc Af Amer: 81 mL/min/{1.73_m2} (ref >59)
GFR calc non Af Amer: 70 mL/min/{1.73_m2} (ref >59)
GLUCOSE: 90 mg/dL (ref 65–99)
Globulin, Total: 3 g/dL (ref 1.5–4.5)
Potassium: 5 mmol/L (ref 3.5–5.2)
Sodium: 140 mmol/L (ref 136–144)
Total Protein: 7.1 g/dL (ref 6.0–8.5)

## 2015-06-29 LAB — TSH: TSH: 6.06 u[IU]/mL — ABNORMAL HIGH (ref 0.450–4.500)

## 2015-06-29 LAB — T4, FREE: Free T4: 1.26 ng/dL (ref 0.82–1.77)

## 2015-06-29 LAB — LIPID PANEL
CHOL/HDL RATIO: 3.2 ratio (ref 0.0–4.4)
Cholesterol, Total: 200 mg/dL — ABNORMAL HIGH (ref 100–199)
HDL: 63 mg/dL (ref >39)
LDL CALC: 112 mg/dL — AB (ref 0–99)
Triglycerides: 125 mg/dL (ref 0–149)
VLDL CHOLESTEROL CAL: 25 mg/dL (ref 5–40)

## 2015-07-01 ENCOUNTER — Other Ambulatory Visit: Payer: Self-pay | Admitting: Family Medicine

## 2015-07-01 DIAGNOSIS — E039 Hypothyroidism, unspecified: Secondary | ICD-10-CM

## 2015-07-01 MED ORDER — LEVOTHYROXINE SODIUM 75 MCG PO TABS: 75.0000 ug | ORAL_TABLET | Freq: Every day | ORAL | Status: DC

## 2015-07-01 MED ORDER — LEVOTHYROXINE SODIUM 75 MCG PO TABS: 50.0000 ug | ORAL_TABLET | Freq: Every day | ORAL | Status: DC

## 2015-07-02 ENCOUNTER — Telehealth: Payer: Self-pay | Admitting: Family Medicine

## 2015-07-02 NOTE — Telephone Encounter (Signed)
Pt notified of results Verbalizes understanding 

## 2015-07-10 DIAGNOSIS — H04123 Dry eye syndrome of bilateral lacrimal glands: Secondary | ICD-10-CM | POA: Diagnosis not present

## 2015-07-10 DIAGNOSIS — H5213 Myopia, bilateral: Secondary | ICD-10-CM | POA: Diagnosis not present

## 2015-07-10 DIAGNOSIS — H52223 Regular astigmatism, bilateral: Secondary | ICD-10-CM | POA: Diagnosis not present

## 2015-07-10 DIAGNOSIS — H2513 Age-related nuclear cataract, bilateral: Secondary | ICD-10-CM | POA: Diagnosis not present

## 2015-07-17 ENCOUNTER — Telehealth: Payer: Self-pay | Admitting: *Deleted

## 2015-07-17 MED ORDER — RALOXIFENE HCL 60 MG PO TABS: 60.0000 mg | ORAL_TABLET | Freq: Every day | ORAL | Status: DC

## 2015-07-17 NOTE — Telephone Encounter (Signed)
Pt cannot afford Prolia injections Please change to something else

## 2015-07-17 NOTE — Telephone Encounter (Signed)
Evista sent. Hopefully cheaper. If not let me know.

## 2015-07-18 NOTE — Telephone Encounter (Signed)
Pt notified of RX Verbalizes understanding 

## 2015-08-23 ENCOUNTER — Other Ambulatory Visit (INDEPENDENT_AMBULATORY_CARE_PROVIDER_SITE_OTHER): Payer: Medicare Other

## 2015-08-23 DIAGNOSIS — E038 Other specified hypothyroidism: Secondary | ICD-10-CM | POA: Diagnosis not present

## 2015-08-23 NOTE — Progress Notes (Signed)
Lab only 

## 2015-08-24 LAB — THYROID PANEL WITH TSH
Free Thyroxine Index: 3.6 (ref 1.2–4.9)
T3 UPTAKE RATIO: 34 % (ref 24–39)
T4, Total: 10.5 ug/dL (ref 4.5–12.0)
TSH: 0.153 u[IU]/mL — ABNORMAL LOW (ref 0.450–4.500)

## 2015-09-24 ENCOUNTER — Other Ambulatory Visit: Payer: Self-pay | Admitting: Family Medicine

## 2015-12-24 ENCOUNTER — Encounter: Payer: Self-pay | Admitting: Family Medicine

## 2015-12-24 ENCOUNTER — Ambulatory Visit (INDEPENDENT_AMBULATORY_CARE_PROVIDER_SITE_OTHER): Payer: Medicare Other | Admitting: Family Medicine

## 2015-12-24 VITALS — BP 140/76 | HR 88 | Temp 97.2°F | Ht 62.0 in | Wt 137.2 lb

## 2015-12-24 DIAGNOSIS — M8580 Other specified disorders of bone density and structure, unspecified site: Secondary | ICD-10-CM

## 2015-12-24 DIAGNOSIS — E039 Hypothyroidism, unspecified: Secondary | ICD-10-CM | POA: Diagnosis not present

## 2015-12-24 DIAGNOSIS — E785 Hyperlipidemia, unspecified: Secondary | ICD-10-CM | POA: Diagnosis not present

## 2015-12-24 NOTE — Progress Notes (Signed)
Subjective:  Patient ID: [name], female    DOB: [birth year]  Age: 70 y.o. MRN: [id number]  CC: Hyperlipidemia; Hypothyroidism; and osteopenia   HPI [name] presents for Patient presents for follow-up on  thyroid. She has a history of hypothyroidism for many years. It has been stable recently. Pt. denies any change in  voice, loss of hair, heat or cold intolerance. Energy level has been adequate to good. She denies constipation and diarrhea. No myxedema. Medication is as noted below. Verified that pt is taking it daily on an empty stomach. Well tolerated.  Although she has been diagnosed with elevated cholesterol she is not taking medication for this. She continues to work on dietary control.  Patient also has history of osteopenia and is due for bone density testing. She takes calcium and vitamin D but no SERM or other agent.  History Jashae has a past medical history of Asthma; Rhinitis; Thyroid disease; Menopausal state; Vitamin D deficiency disease; Osteopenia; Allergy; Hyperlipidemia; Colon polyps; and Cataract.   She has past surgical history that includes Tonsillectomy and Cholecystectomy (2005).   Her family history includes Asthma in her brother; Gout in her sister; Heart attack in her father; Heart disease in her father; Hyperlipidemia in her mother; Hypertension in her brother, mother, and sister; Osteoporosis in her mother; Thyroid disease in her mother.She reports that she has never smoked. She has never used smokeless tobacco. She reports that she does not drink alcohol or use illicit drugs.  Outpatient Prescriptions Prior to Visit  Medication Sig Dispense Refill  . Cholecalciferol (VITAMIN D3) 3000 UNITS TABS Take 2,000 Units by mouth daily.     . fluticasone (FLONASE) 50 MCG/ACT nasal spray USE TWO SPRAY(S) IN EACH NOSTRIL ONCE DAILY 16 g 11  . Fluticasone-Salmeterol (ADVAIR DISKUS) 100-50 MCG/DOSE AEPB Inhale 1 puff into the lungs 2 (two) times daily. 60 each 5    . levothyroxine (SYNTHROID, LEVOTHROID) 75 MCG tablet TAKE ONE TABLET BY MOUTH ONCE DAILY 30 tablet 10  . Multiple Vitamin (MULTIVITAMIN WITH MINERALS) TABS Take 1 tablet by mouth daily.    . raloxifene (EVISTA) 60 MG tablet Take 1 tablet (60 mg total) by mouth daily. For bone health 30 tablet 11  . albuterol (PROVENTIL HFA;VENTOLIN HFA) 108 (90 BASE) MCG/ACT inhaler Inhale 2 puffs into the lungs every 6 (six) hours as needed for wheezing. (Patient not taking: Reported on 12/24/2015) 1 Inhaler 11  . denosumab (PROLIA) 60 MG/ML SOLN injection Inject 60 mg into the skin every 6 (six) months. Administer in upper arm, thigh, or abdomen (Patient not taking: Reported on 12/24/2015) 1 Syringe 1   No facility-administered medications prior to visit.    ROS Review of Systems  Constitutional: Negative for fever, activity change and appetite change.  HENT: Negative for congestion, rhinorrhea and sore throat.   Eyes: Negative for pain and visual disturbance.  Respiratory: Negative for cough and shortness of breath.   Gastrointestinal: Negative for nausea and abdominal pain.  Musculoskeletal: Negative for myalgias and arthralgias.    Objective:  BP 140/76 mmHg  Pulse 88  Temp(Src) 97.2 F (36.2 C) (Oral)  Ht 5' 2"  (1.575 m)  Wt 137 lb 3.2 oz (62.234 kg)  BMI 25.09 kg/m2  SpO2 98%  BP Readings from Last 3 Encounters:  12/24/15 140/76  06/28/15 140/79  03/09/15 136/80    Wt Readings from Last 3 Encounters:  12/24/15 137 lb 3.2 oz (62.234 kg)  06/28/15 140 lb 12.8 oz (63.866 kg)  05/02/15 141 lb (63.957 kg)     Physical Exam  Constitutional: She is oriented to person, place, and time. She appears well-developed and well-nourished. No distress.  HENT:  Head: Normocephalic and atraumatic.  Eyes: Conjunctivae are normal. Pupils are equal, round, and reactive to light.  Neck: Normal range of motion. Neck supple. No thyromegaly present.  Cardiovascular: Normal rate, regular rhythm and normal  heart sounds.   No murmur heard. Pulmonary/Chest: Effort normal and breath sounds normal. No respiratory distress. She has no wheezes. She has no rales.  Abdominal: Soft. Bowel sounds are normal. She exhibits no distension. There is no tenderness.  Musculoskeletal: Normal range of motion.  Lymphadenopathy:    She has no cervical adenopathy.  Neurological: She is alert and oriented to person, place, and time.  Skin: Skin is warm and dry.  Psychiatric: She has a normal mood and affect. Her behavior is normal. Judgment and thought content normal.    No results found for: HGBA1C  Lab Results  Component Value Date   WBC 4.1 06/28/2015   HCT 41.3 06/28/2015   PLT 270 06/28/2015   GLUCOSE 90 06/28/2015   CHOL 200* 06/28/2015   TRIG 125 06/28/2015   HDL 63 06/28/2015   LDLCALC 112* 06/28/2015   ALT 11 06/28/2015   AST 15 06/28/2015   NA 140 06/28/2015   K 5.0 06/28/2015   CL 99 06/28/2015   CREATININE 0.84 06/28/2015   BUN 7* 06/28/2015   CO2 25 06/28/2015   TSH 0.153* 08/23/2015    No results found.  Assessment & Plan:   Nancye was seen today for hyperlipidemia, hypothyroidism and osteopenia.  Diagnoses and all orders for this visit:  Hypothyroidism, unspecified hypothyroidism type -     TSH + free T4 -     CBC with Differential/Platelet -     CMP14+EGFR  Hyperlipidemia -     CBC with Differential/Platelet -     CMP14+EGFR -     Lipid panel  Osteopenia -     CBC with Differential/Platelet -     CMP14+EGFR   I have discontinued Ms. Majeed's denosumab. I am also having her maintain her multivitamin with minerals, Vitamin D3, Fluticasone-Salmeterol, albuterol, fluticasone, raloxifene, and levothyroxine.  No orders of the defined types were placed in this encounter.     Follow-up: Return in about 6 months (around 06/25/2016) for CPE.  [name], M.D.

## 2015-12-25 LAB — CMP14+EGFR
A/G RATIO: 1.3 (ref 1.2–2.2)
ALT: 12 IU/L (ref 0–32)
AST: 14 IU/L (ref 0–40)
Albumin: 3.9 g/dL (ref 3.5–4.8)
Alkaline Phosphatase: 48 IU/L (ref 39–117)
BUN/Creatinine Ratio: 11 — ABNORMAL LOW (ref 12–28)
BUN: 9 mg/dL (ref 8–27)
Bilirubin Total: 0.5 mg/dL (ref 0.0–1.2)
CALCIUM: 10.2 mg/dL (ref 8.7–10.3)
CO2: 25 mmol/L (ref 18–29)
Chloride: 102 mmol/L (ref 96–106)
Creatinine, Ser: 0.8 mg/dL (ref 0.57–1.00)
GFR calc Af Amer: 86 mL/min/{1.73_m2} (ref >59)
GFR, EST NON AFRICAN AMERICAN: 74 mL/min/{1.73_m2} (ref >59)
Globulin, Total: 3 g/dL (ref 1.5–4.5)
Glucose: 89 mg/dL (ref 65–99)
POTASSIUM: 4.8 mmol/L (ref 3.5–5.2)
Sodium: 141 mmol/L (ref 134–144)
TOTAL PROTEIN: 6.9 g/dL (ref 6.0–8.5)

## 2015-12-25 LAB — CBC WITH DIFFERENTIAL/PLATELET
BASOS: 1 %
Basophils Absolute: 0 10*3/uL (ref 0.0–0.2)
EOS (ABSOLUTE): 0.1 10*3/uL (ref 0.0–0.4)
EOS: 3 %
Hematocrit: 39.4 % (ref 34.0–46.6)
Hemoglobin: 13.2 g/dL (ref 11.1–15.9)
IMMATURE GRANS (ABS): 0 10*3/uL (ref 0.0–0.1)
IMMATURE GRANULOCYTES: 0 %
LYMPHS: 20 %
Lymphocytes Absolute: 0.8 10*3/uL (ref 0.7–3.1)
MCH: 30 pg (ref 26.6–33.0)
MCHC: 33.5 g/dL (ref 31.5–35.7)
MCV: 90 fL (ref 79–97)
MONOCYTES: 7 %
Monocytes Absolute: 0.3 10*3/uL (ref 0.1–0.9)
NEUTROS PCT: 69 %
Neutrophils Absolute: 2.8 10*3/uL (ref 1.4–7.0)
PLATELETS: 298 10*3/uL (ref 150–379)
RBC: 4.4 x10E6/uL (ref 3.77–5.28)
RDW: 14.1 % (ref 12.3–15.4)
WBC: 4 10*3/uL (ref 3.4–10.8)

## 2015-12-25 LAB — LIPID PANEL
CHOL/HDL RATIO: 3 ratio (ref 0.0–4.4)
Cholesterol, Total: 177 mg/dL (ref 100–199)
HDL: 60 mg/dL (ref >39)
LDL Calculated: 93 mg/dL (ref 0–99)
TRIGLYCERIDES: 121 mg/dL (ref 0–149)
VLDL CHOLESTEROL CAL: 24 mg/dL (ref 5–40)

## 2015-12-25 LAB — TSH+FREE T4
Free T4: 1.61 ng/dL (ref 0.82–1.77)
TSH: 0.113 u[IU]/mL — AB (ref 0.450–4.500)

## 2016-01-07 ENCOUNTER — Other Ambulatory Visit: Payer: Self-pay | Admitting: Family Medicine

## 2016-01-15 DIAGNOSIS — B070 Plantar wart: Secondary | ICD-10-CM | POA: Diagnosis not present

## 2016-01-15 DIAGNOSIS — M79671 Pain in right foot: Secondary | ICD-10-CM | POA: Diagnosis not present

## 2016-02-07 DIAGNOSIS — M79675 Pain in left toe(s): Secondary | ICD-10-CM | POA: Diagnosis not present

## 2016-02-07 DIAGNOSIS — M2042 Other hammer toe(s) (acquired), left foot: Secondary | ICD-10-CM | POA: Diagnosis not present

## 2016-05-26 ENCOUNTER — Ambulatory Visit (INDEPENDENT_AMBULATORY_CARE_PROVIDER_SITE_OTHER): Payer: Medicare Other

## 2016-05-26 DIAGNOSIS — Z23 Encounter for immunization: Secondary | ICD-10-CM | POA: Diagnosis not present

## 2016-06-25 ENCOUNTER — Ambulatory Visit (INDEPENDENT_AMBULATORY_CARE_PROVIDER_SITE_OTHER): Payer: Medicare Other | Admitting: Family Medicine

## 2016-06-25 ENCOUNTER — Encounter: Payer: Self-pay | Admitting: Family Medicine

## 2016-06-25 VITALS — BP 137/81 | HR 83 | Temp 97.9°F | Ht 62.0 in | Wt 142.4 lb

## 2016-06-25 DIAGNOSIS — E038 Other specified hypothyroidism: Secondary | ICD-10-CM

## 2016-06-25 DIAGNOSIS — M8589 Other specified disorders of bone density and structure, multiple sites: Secondary | ICD-10-CM | POA: Diagnosis not present

## 2016-06-25 DIAGNOSIS — J4540 Moderate persistent asthma, uncomplicated: Secondary | ICD-10-CM

## 2016-06-25 DIAGNOSIS — E782 Mixed hyperlipidemia: Secondary | ICD-10-CM | POA: Diagnosis not present

## 2016-06-25 MED ORDER — RISEDRONATE SODIUM 150 MG PO TABS: 150.0000 mg | ORAL_TABLET | ORAL | 3 refills | Status: DC

## 2016-06-25 NOTE — Progress Notes (Signed)
Subjective:  Patient ID: [name], female    DOB: Jul 11, 1946  Age: 70 y.o. MRN: [id number]  CC: Hypothyroidism (pt here today for routine follow up of hypothyroidism, pt can't take evista or fosamax and wants to discuss other alternatives)   HPI [name] presents for Patient presents for follow-up on  thyroid. The patient has a history of hypothyroidism for many years. It has been stable recently. Pt. denies any change in  voice, loss of hair, heat or cold intolerance. Energy level has been adequate to good. Patient denies constipation and diarrhea. No myxedema. Medication is as noted below. Verified that pt is taking it daily on an empty stomach. Well tolerated.  Also due for her breast exam and mammogram. She will schedule her mammogram today. Bone density has been done about a year ago. She says that the Evista is causing bone pain as did the Fosamax. Fosamax wasn't as bad as. This doesn't she wants to go back to that due to cost area however she is aware that the bone pain could return. She is open to other options. She says probably it was tried less than a year ago and was unaffordable for her.   History [name] has a past medical history of Allergy; Asthma; Cataract; Colon polyps; Hyperlipidemia; Menopausal state; Osteopenia; Rhinitis; Thyroid disease; and Vitamin D deficiency disease.   She has a past surgical history that includes Tonsillectomy and Cholecystectomy (2005).   Her family history includes Asthma in her brother; Gout in her sister; Heart attack in her father; Heart disease in her father; Hyperlipidemia in her mother; Hypertension in her brother, mother, and sister; Osteoporosis in her mother; Thyroid disease in her mother.She reports that she has never smoked. She has never used smokeless tobacco. She reports that she does not drink alcohol or use drugs.    ROS Review of Systems  Constitutional: Negative for appetite change, chills, diaphoresis, fatigue, fever  and unexpected weight change.  HENT: Negative for congestion, ear pain, hearing loss, postnasal drip, rhinorrhea, sneezing, sore throat and trouble swallowing.   Eyes: Negative for pain.  Respiratory: Negative for cough, chest tightness and shortness of breath.   Cardiovascular: Negative for chest pain and palpitations.  Gastrointestinal: Negative for abdominal pain, constipation, diarrhea, nausea and vomiting.  Endocrine: Negative for cold intolerance, heat intolerance, polydipsia, polyphagia and polyuria.  Genitourinary: Negative for dysuria, frequency and menstrual problem.  Musculoskeletal: Negative for arthralgias and joint swelling.  Skin: Negative for rash.  Allergic/Immunologic: Negative for environmental allergies.  Neurological: Negative for dizziness, weakness, numbness and headaches.  Psychiatric/Behavioral: Negative for agitation and dysphoric mood.    Objective:  BP 137/81   Pulse 83   Temp 97.9 F (36.6 C) (Oral)   Ht 5\' 2"  (1.575 m)   Wt 142 lb 6 oz (64.6 kg)   BMI 26.04 kg/m   BP Readings from Last 3 Encounters:  06/25/16 137/81  12/24/15 140/76  06/28/15 140/79    Wt Readings from Last 3 Encounters:  06/25/16 142 lb 6 oz (64.6 kg)  12/24/15 137 lb 3.2 oz (62.2 kg)  06/28/15 140 lb 12.8 oz (63.9 kg)     Physical Exam  Constitutional: She is oriented to person, place, and time. She appears well-developed and well-nourished. No distress.  HENT:  Head: Normocephalic and atraumatic.  Right Ear: External ear normal.  Left Ear: External ear normal.  Nose: Nose normal.  Mouth/Throat: Oropharynx is clear and moist.  Eyes: Conjunctivae and EOM are normal. Pupils  are equal, round, and reactive to light.  Neck: Normal range of motion. Neck supple. No JVD present. No tracheal deviation present. No thyromegaly present.  Cardiovascular: Normal rate, regular rhythm and normal heart sounds.   No murmur heard. Pulmonary/Chest: Effort normal and breath sounds normal.  No respiratory distress. She has no wheezes. She has no rales.  Abdominal: Soft. Bowel sounds are normal. She exhibits no distension. There is no tenderness.  Lymphadenopathy:    She has no cervical adenopathy.  Neurological: She is alert and oriented to person, place, and time. She has normal reflexes.  Skin: Skin is warm and dry.  Psychiatric: She has a normal mood and affect. Her behavior is normal. Judgment and thought content normal.     Lab Results  Component Value Date   WBC 4.0 12/24/2015   HCT 39.4 12/24/2015   PLT 298 12/24/2015   GLUCOSE 89 12/24/2015   CHOL 177 12/24/2015   TRIG 121 12/24/2015   HDL 60 12/24/2015   LDLCALC 93 12/24/2015   ALT 12 12/24/2015   AST 14 12/24/2015   NA 141 12/24/2015   K 4.8 12/24/2015   CL 102 12/24/2015   CREATININE 0.80 12/24/2015   BUN 9 12/24/2015   CO2 25 12/24/2015   TSH 0.113 (L) 12/24/2015    No results found.  Assessment & Plan:   [name] was seen today for hypothyroidism.  Diagnoses and all orders for this visit:  Other specified hypothyroidism  Mixed hyperlipidemia  Osteopenia of multiple sites  Asthma, chronic, moderate persistent, uncomplicated  Other orders -     risedronate (ACTONEL) 150 MG tablet; Take 1 tablet (150 mg total) by mouth every 30 (thirty) days. with water, on an empty stomach, take nothing by mouth and to not lie down for 30 minutes after each dose.   Side effects with Evista and Fosamax with bone pain. Therefore trying Actonel as an alternative. Hopefully we can get it approved without excessive expense.  Patient will schedule her mammogram. Bone density is due next year. I have discontinued Ms. Swallow's raloxifene. I am also having her start on risedronate. Additionally, I am having her maintain her multivitamin with minerals, Vitamin D3, albuterol, fluticasone, levothyroxine, and ADVAIR DISKUS.  Meds ordered this encounter  Medications  . risedronate (ACTONEL) 150 MG tablet    Sig: Take  1 tablet (150 mg total) by mouth every 30 (thirty) days. with water, on an empty stomach, take nothing by mouth and to not lie down for 30 minutes after each dose.    Dispense:  3 tablet    Refill:  3     Follow-up: Return in about 6 months (around 12/23/2016) for Hypothyroidism. Set up wellness visit soon.  Mechele ClaudeWarren Idamay Hosein, M.D.

## 2016-06-25 NOTE — Addendum Note (Signed)
Addended by: Margurite AuerbachOMPTON, Brithany Whitworth G on: 06/25/2016 05:08 PM   Modules accepted: Orders

## 2016-06-25 NOTE — Patient Instructions (Signed)
Patient is to schedule for mammogram.  Also schedule your wellness visit at checkout  Check the price of the Actonel. Let me know if it's too expensive. It tends to have fewer side effects than the one she tried so far.

## 2016-06-26 LAB — CMP14+EGFR
A/G RATIO: 1.3 (ref 1.2–2.2)
ALBUMIN: 4 g/dL (ref 3.5–4.8)
ALK PHOS: 62 IU/L (ref 39–117)
ALT: 14 IU/L (ref 0–32)
AST: 15 IU/L (ref 0–40)
BILIRUBIN TOTAL: 0.7 mg/dL (ref 0.0–1.2)
BUN / CREAT RATIO: 9 — AB (ref 12–28)
BUN: 8 mg/dL (ref 8–27)
CHLORIDE: 100 mmol/L (ref 96–106)
CO2: 27 mmol/L (ref 18–29)
Calcium: 10.3 mg/dL (ref 8.7–10.3)
Creatinine, Ser: 0.89 mg/dL (ref 0.57–1.00)
GFR calc non Af Amer: 65 mL/min/{1.73_m2} (ref >59)
GFR, EST AFRICAN AMERICAN: 75 mL/min/{1.73_m2} (ref >59)
Globulin, Total: 3.1 g/dL (ref 1.5–4.5)
Glucose: 91 mg/dL (ref 65–99)
POTASSIUM: 4.3 mmol/L (ref 3.5–5.2)
SODIUM: 139 mmol/L (ref 134–144)
TOTAL PROTEIN: 7.1 g/dL (ref 6.0–8.5)

## 2016-06-26 LAB — CBC WITH DIFFERENTIAL/PLATELET
BASOS ABS: 0 10*3/uL (ref 0.0–0.2)
Basos: 0 %
EOS (ABSOLUTE): 0.1 10*3/uL (ref 0.0–0.4)
EOS: 1 %
HEMATOCRIT: 41.4 % (ref 34.0–46.6)
Hemoglobin: 13.7 g/dL (ref 11.1–15.9)
IMMATURE GRANULOCYTES: 0 %
Immature Grans (Abs): 0 10*3/uL (ref 0.0–0.1)
Lymphocytes Absolute: 0.8 10*3/uL (ref 0.7–3.1)
Lymphs: 15 %
MCH: 30.5 pg (ref 26.6–33.0)
MCHC: 33.1 g/dL (ref 31.5–35.7)
MCV: 92 fL (ref 79–97)
MONOCYTES: 7 %
MONOS ABS: 0.4 10*3/uL (ref 0.1–0.9)
NEUTROS PCT: 77 %
Neutrophils Absolute: 4 10*3/uL (ref 1.4–7.0)
Platelets: 267 10*3/uL (ref 150–379)
RBC: 4.49 x10E6/uL (ref 3.77–5.28)
RDW: 13.6 % (ref 12.3–15.4)
WBC: 5.2 10*3/uL (ref 3.4–10.8)

## 2016-06-26 LAB — LIPID PANEL
Chol/HDL Ratio: 3.1 ratio units (ref 0.0–4.4)
Cholesterol, Total: 193 mg/dL (ref 100–199)
HDL: 62 mg/dL (ref >39)
LDL Calculated: 109 mg/dL — ABNORMAL HIGH (ref 0–99)
TRIGLYCERIDES: 112 mg/dL (ref 0–149)
VLDL CHOLESTEROL CAL: 22 mg/dL (ref 5–40)

## 2016-06-26 LAB — THYROID PANEL WITH TSH
Free Thyroxine Index: 3 (ref 1.2–4.9)
T3 Uptake Ratio: 30 % (ref 24–39)
T4, Total: 10 ug/dL (ref 4.5–12.0)
TSH: 0.043 u[IU]/mL — ABNORMAL LOW (ref 0.450–4.500)

## 2016-07-02 ENCOUNTER — Ambulatory Visit (INDEPENDENT_AMBULATORY_CARE_PROVIDER_SITE_OTHER): Payer: Medicare Other | Admitting: *Deleted

## 2016-07-02 VITALS — BP 153/83 | HR 79 | Ht 62.0 in | Wt 146.0 lb

## 2016-07-02 DIAGNOSIS — Z1211 Encounter for screening for malignant neoplasm of colon: Secondary | ICD-10-CM

## 2016-07-02 DIAGNOSIS — Z0000 Encounter for general adult medical examination without abnormal findings: Secondary | ICD-10-CM | POA: Diagnosis not present

## 2016-07-02 NOTE — Patient Instructions (Addendum)
  Ms. Excell SeltzerBaker ,  Thank you for taking time to come for your Medicare Wellness Visit. I appreciate your ongoing commitment to your health goals. Please review the following plan we discussed and let me know if I can assist you in the future.   These are the goals we discussed: Goals    . Exercise 3x per week (30 min per time)          Walk for 30 minutes at least 3 times per week.        This is a list of the screening recommended for you and due dates:  Health Maintenance  Topic Date Due  .  Hepatitis C: One time screening is recommended by Center for Disease Control  (CDC) for  adults born from [birth year] through 1967.   05-Sep-1945  . Shingles Vaccine  05/14/2004  . Colon Cancer Screening  10/20/2012  . Stool Blood Test  05/09/2015  . Mammogram  03/25/2016  . DEXA scan (bone density measurement)  05/01/2017  . Tetanus Vaccine  09/29/2017  . Flu Shot  Completed  . Pneumonia vaccines  Completed    Return [facility]T Return signed and notarized copy of Advanced Directives

## 2016-07-03 NOTE — Progress Notes (Addendum)
Subjective:   [name] is a 70 y.o. female who presents for a Subsequent Medicare Annual Wellness Visit. 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 She enjoys reading, crossword, word search puzzles, and genealogy. She lives at home with her mother and doesn't have any children. She does have one indoor cat and 2 outdoor cats.  Review of Systems    [name] reports that her health is about the same as last year.  Musculoskeletal: R knee arthritis pain. Takes Tylenol for this.   Cardiac Risk Factors include: advanced age (>7055men, 28>65 women);dyslipidemia;obesity (BMI >30kg/m2);sedentary lifestyle    Other systems negative.  Objective:    Today's Vitals   07/02/16 1508  BP: (!) 153/83  Pulse: 79  Weight: 146 lb (66.2 kg)  Height: 5\' 2"  (1.575 m)   Body mass index is 26.7 kg/m.   Current Medications (verified) Outpatient Encounter Prescriptions as of 07/02/2016  Medication Sig  . ADVAIR DISKUS 100-50 MCG/DOSE AEPB INHALE ONE DOSE BY MOUTH TWICE DAILY  . albuterol (PROVENTIL HFA;VENTOLIN HFA) 108 (90 BASE) MCG/ACT inhaler Inhale 2 puffs into the lungs every 6 (six) hours as needed for wheezing.  . Cholecalciferol (VITAMIN D3) 3000 UNITS TABS Take 2,000 Units by mouth daily.   . fluticasone (FLONASE) 50 MCG/ACT nasal spray USE TWO SPRAY(S) IN EACH NOSTRIL ONCE DAILY  . levothyroxine (SYNTHROID, LEVOTHROID) 75 MCG tablet TAKE ONE TABLET BY MOUTH ONCE DAILY  . Multiple Vitamin (MULTIVITAMIN WITH MINERALS) TABS Take 1 tablet by mouth daily.  . risedronate (ACTONEL) 150 MG tablet Take 1 tablet (150 mg total) by mouth every 30 (thirty) days. with water, on an empty stomach, take nothing by mouth and to not lie down for 30 minutes after each dose.   No facility-administered encounter medications on file as of 07/02/2016.     Allergies (verified) Evista [raloxifene hcl]; Fosamax [alendronate sodium]; Red dye; and Sulfa antibiotics   History: Past Medical  History:  Diagnosis Date  . Allergy    seasonal  . Asthma   . Cataract   . Colon polyps   . Hyperlipidemia   . Menopausal state   . Osteopenia   . Rhinitis   . Thyroid disease   . Vitamin D deficiency disease    Past Surgical History:  Procedure Laterality Date  . CHOLECYSTECTOMY  2005  . TONSILLECTOMY     Family History  Problem Relation Age of Onset  . Hyperlipidemia Mother   . Hypertension Mother   . Thyroid disease Mother   . Osteoporosis Mother   . Heart disease Father   . Heart attack Father     heavy smoker  . Hypertension Sister   . Gout Sister   . Hypertension Brother   . Asthma Brother    Social History   Occupational History  . Not on file.   Social History Main Topics  . Smoking status: Never Smoker  . Smokeless tobacco: Never Used  . Alcohol use No  . Drug use: No  . Sexual activity: No    Tobacco Counseling No tobacco use  Activities of Daily Living In your present state of health, do you have any difficulty performing the following activities: 07/02/2016 12/24/2015  Hearing? Y N  Vision? N N  Difficulty concentrating or making decisions? N N  Walking or climbing stairs? N N  Dressing or bathing? N N  Doing errands, shopping? N N  Preparing Food and eating ? N -  Using the  Toilet? N -  In the past six months, have you accidently leaked urine? N -  Do you have problems with loss of bowel control? N -  Managing your Medications? N -  Managing your Finances? N -  Housekeeping or managing your Housekeeping? N -  Some recent data might be hidden    Immunizations and Health Maintenance Immunization History  Administered Date(s) Administered  . Influenza,inj,Quad PF,36+ Mos 06/10/2013, 06/05/2014, 06/05/2015, 05/26/2016  . Pneumococcal Conjugate-13 12/26/2014  . Pneumococcal Polysaccharide-23 11/22/2012  . Td 09/30/2007   Health Maintenance Due  Topic Date Due  . Hepatitis C Screening  03-16-1946  . ZOSTAVAX  05/14/2004  . COLONOSCOPY   10/20/2012  . COLON CANCER SCREENING ANNUAL FOBT  05/09/2015  . MAMMOGRAM  03/25/2016    Patient Care Team: Mechele ClaudeWarren Stacks, MD as PCP - General (Family Medicine) Ernesto Rutherfordobert Groat, MD as Consulting Physician (Ophthalmology)      Assessment:   This is a routine wellness examination for [name].   Hearing/Vision screen No hearing or vision deficits noted during exam.  Dietary issues and exercise activities discussed: Current Exercise Habits: Home exercise routine, Type of exercise: walking, Time (Minutes): 30, Frequency (Times/Week): 1, Weekly Exercise (Minutes/Week): 30, Intensity: Mild, Exercise limited by: orthopedic condition(s) (Some right knee pain)   Eats 3 small meals a day and has an Ensure in the morning for snack. She and her mother prepare their won meals mostly and eat our once per week.   Goals    . Exercise 3x per week (30 min per time)          Walk for 30 minutes at least 3 times per week.       Depression Screen PHQ 2/9 Scores 07/02/2016 06/25/2016 12/24/2015 06/28/2015 03/09/2015 12/26/2014 04/24/2014  PHQ - 2 Score 0 0 0 0 0 0 0    Fall Risk Fall Risk  07/02/2016 06/25/2016 12/24/2015 06/28/2015 03/09/2015  Falls in the past year? No No No No No  Risk for fall due to : - - - - -    Cognitive Function: MMSE - Mini Mental State Exam 07/02/2016 03/09/2015  Orientation to time 5 5  Orientation to Place - 5  Registration 3 3  Attention/ Calculation 5 5  Recall 2 3  Language- name 2 objects 2 2  Language- repeat 1 1  Language- follow 3 step command 3 3  Language- read & follow direction 1 1  Write a sentence 1 1  Copy design 0 0  Total score - 29    Score of 28. Normal exam.    Screening Tests Health Maintenance  Topic Date Due  . Hepatitis C Screening  03-16-1946  . ZOSTAVAX  05/14/2004  . COLONOSCOPY  10/20/2012  . COLON CANCER SCREENING ANNUAL FOBT  05/09/2015  . MAMMOGRAM  03/25/2016  . DEXA SCAN  05/01/2017  . TETANUS/TDAP  09/29/2017  . INFLUENZA VACCINE   Completed  . PNA vac Low Risk Adult  Completed      Plan:  Return FOBT.  Review and bring a signed/notarized copy of your Advanced Directives to our office.   During the course of the visit, [name] was educated and counseled about the following appropriate screening and preventive services:   Vaccines to include Pneumoccal-up to date, Influenza-up to date, Td-up to date,  Cardiovascular disease screening-lipids checked with routine labs  Colorectal cancer screening-FOBT given today  Bone density screening-done 05/02/15  Diabetes screening-with routine labs  Glaucoma screening-Due, will schedule appt  Mammography-scheduled 11/19/16  Nutrition counseling   Patient Instructions (the written plan) were given to the patient.    Demetrios Loll, RN  07/03/2016   I have reviewed and agree with the above AWV documentation.   Rex Kras, MD [place] [facility] 07/04/2016, 4:31 PM

## 2016-07-11 ENCOUNTER — Other Ambulatory Visit: Payer: Self-pay | Admitting: Family Medicine

## 2016-08-23 ENCOUNTER — Other Ambulatory Visit: Payer: Self-pay | Admitting: Family Medicine

## 2016-09-30 DIAGNOSIS — H2513 Age-related nuclear cataract, bilateral: Secondary | ICD-10-CM | POA: Diagnosis not present

## 2016-09-30 DIAGNOSIS — H04123 Dry eye syndrome of bilateral lacrimal glands: Secondary | ICD-10-CM | POA: Diagnosis not present

## 2016-11-19 ENCOUNTER — Encounter: Payer: Medicare Other | Admitting: *Deleted

## 2016-11-19 DIAGNOSIS — Z1231 Encounter for screening mammogram for malignant neoplasm of breast: Secondary | ICD-10-CM | POA: Diagnosis not present

## 2016-12-23 ENCOUNTER — Ambulatory Visit (INDEPENDENT_AMBULATORY_CARE_PROVIDER_SITE_OTHER): Payer: Medicare Other | Admitting: Family Medicine

## 2016-12-23 ENCOUNTER — Encounter: Payer: Self-pay | Admitting: Family Medicine

## 2016-12-23 VITALS — BP 147/88 | HR 89 | Temp 97.7°F | Ht 62.0 in | Wt 142.0 lb

## 2016-12-23 DIAGNOSIS — J4530 Mild persistent asthma, uncomplicated: Secondary | ICD-10-CM

## 2016-12-23 DIAGNOSIS — E039 Hypothyroidism, unspecified: Secondary | ICD-10-CM | POA: Diagnosis not present

## 2016-12-23 DIAGNOSIS — E782 Mixed hyperlipidemia: Secondary | ICD-10-CM

## 2016-12-23 NOTE — Progress Notes (Signed)
Subjective:  Patient ID: [name], female    DOB: [birth year]  Age: 71 y.o. MRN: [id number]  CC: Hypothyroidism (pt here today for routine follow up on her chronic medical conditions but also wants to discuss Boniva as her insurance is going to stop paying for her Actonel.)   HPI [name] presents for Patient presents for follow-up on  thyroid. The patient has a history of hypothyroidism for many years. It has been stable recently. Pt. denies any change in  voice, loss of hair, heat or cold intolerance. Energy level has been adequate to good. Patient denies constipation and diarrhea. No myxedema. Medication is as noted below. Verified that pt is taking it daily on an empty stomach. Well tolerated.  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 She actually says the [name] is going to be just as expensive as Actonel so there is really no need to consider that at this point with regard to cost. She is given a check with her insurance about just how much cheaper the Fosamax generic would be.  Patient using her inhaler Advair Diskus without dyspnea. She is rarely using the albuterol. She denies shortness of breath and cough. Energy remains good. She started walking some recently and feels that she is already getting more energy from that. The weather has been cold so she backed off through the winter but since it's warming up she is become more active and feels better.   History [name] has a past medical history of Allergy; Asthma; Cataract; Colon polyps; Hyperlipidemia; Menopausal state; Osteopenia; Rhinitis; Thyroid disease; and Vitamin D deficiency disease.   She has a past surgical history that includes Tonsillectomy and Cholecystectomy (2005).   Her family history includes Asthma in her brother; Gout in  her sister; Heart attack in her father; Heart disease in her father; Hyperlipidemia in her mother; Hypertension in her brother, mother, and sister; Osteoporosis in her mother; Thyroid disease in her mother.She reports that she has never smoked. She has never used smokeless tobacco. She reports that she does not drink alcohol or use drugs.    ROS Review of Systems  Constitutional: Negative for activity change, appetite change and fever.  HENT: Negative for congestion, rhinorrhea and sore throat.   Eyes: Negative for visual disturbance.  Respiratory: Negative for cough and shortness of breath.   Cardiovascular: Negative for chest pain and palpitations.  Gastrointestinal: Negative for abdominal pain, diarrhea and nausea.  Genitourinary: Negative for dysuria.  Musculoskeletal: Negative for arthralgias and myalgias.    Objective:  BP (!) 147/88   Pulse 89   Temp 97.7 F (36.5 C) (Oral)   Ht 5' 2"  (1.575 m)   Wt 142 lb (64.4 kg)   BMI 25.97 kg/m   BP Readings from Last 3 Encounters:  12/23/16 (!) 147/88  07/02/16 (!) 153/83  06/25/16 137/81    Wt Readings from Last 3 Encounters:  12/23/16 142 lb (64.4 kg)  07/02/16 146 lb (66.2 kg)  06/25/16 142 lb 6 oz (64.6 kg)     Physical Exam  Constitutional: She is oriented to person, place, and time. She appears well-developed and well-nourished. No distress.  HENT:  Head: Normocephalic and atraumatic.  Right Ear: External ear normal.  Left Ear: External ear normal.  Nose: Nose normal.  Mouth/Throat:  Oropharynx is clear and moist.  Eyes: Conjunctivae and EOM are normal. Pupils are equal, round, and reactive to light.  Neck: Normal range of motion. Neck supple. No thyromegaly present.  Cardiovascular: Normal rate, regular rhythm and normal heart sounds.   No murmur heard. Pulmonary/Chest: Effort normal and breath sounds normal. No respiratory distress. She has no wheezes. She has no rales.  Abdominal: Soft. Bowel sounds are normal.  She exhibits no distension. There is no tenderness.  Lymphadenopathy:    She has no cervical adenopathy.  Neurological: She is alert and oriented to person, place, and time. She has normal reflexes.  Skin: Skin is warm and dry.  Psychiatric: She has a normal mood and affect. Her behavior is normal. Judgment and thought content normal.      Assessment & Plan:   [name] was seen today for hypothyroidism.  Diagnoses and all orders for this visit:  Mixed hyperlipidemia -     CMP14+EGFR -     Lipid panel  Hypothyroidism, unspecified type -     CBC with Differential/Platelet -     CMP14+EGFR -     Lipid panel -     TSH + free T4  Mild persistent chronic asthma without complication    Continue inhalers as is for her asthma. Monitor periodically.   I am having [name] maintain her multivitamin with minerals, Vitamin D3, albuterol, risedronate, fluticasone, ADVAIR DISKUS, and levothyroxine.  Allergies as of 12/23/2016      Reactions   Evista [raloxifene Hcl] Other (See Comments)   Bone pain, couldn't put weight on right leg/foot   Fosamax [alendronate Sodium]    Bone pain   Red Dye    Sulfa Antibiotics       Medication List       Accurate as of 12/23/16  8:56 AM. Always use your most recent med list.          ADVAIR DISKUS 100-50 MCG/DOSE Aepb Generic drug:  Fluticasone-Salmeterol INHALE ONE DOSE BY MOUTH TWICE DAILY   albuterol 108 (90 Base) MCG/ACT inhaler Commonly known as:  PROVENTIL HFA;VENTOLIN HFA Inhale 2 puffs into the lungs every 6 (six) hours as needed for wheezing.   fluticasone 50 MCG/ACT nasal spray Commonly known as:  FLONASE USE TWO SPRAY(S) IN EACH NOSTRIL ONCE DAILY   levothyroxine 75 MCG tablet Commonly known as:  SYNTHROID, LEVOTHROID TAKE ONE TABLET BY MOUTH ONCE DAILY   multivitamin with minerals Tabs tablet Take 1 tablet by mouth daily.   risedronate 150 MG tablet Commonly known as:  ACTONEL Take 1 tablet (150 mg total) by mouth  every 30 (thirty) days. with water, on an empty stomach, take nothing by mouth and to not lie down for 30 minutes after each dose.   Vitamin D3 3000 units Tabs Take 2,000 Units by mouth daily.        Follow-up: Return in about 6 months (around 06/25/2017).  [name], M.D.

## 2016-12-24 LAB — TSH+FREE T4
Free T4: 1.72 ng/dL (ref 0.82–1.77)
TSH: 0.1 u[IU]/mL — ABNORMAL LOW (ref 0.450–4.500)

## 2016-12-24 LAB — CMP14+EGFR
ALBUMIN: 4.1 g/dL (ref 3.5–4.8)
ALK PHOS: 67 IU/L (ref 39–117)
ALT: 13 IU/L (ref 0–32)
AST: 16 IU/L (ref 0–40)
Albumin/Globulin Ratio: 1.4 (ref 1.2–2.2)
BUN/Creatinine Ratio: 8 — ABNORMAL LOW (ref 12–28)
BUN: 7 mg/dL — ABNORMAL LOW (ref 8–27)
Bilirubin Total: 0.7 mg/dL (ref 0.0–1.2)
CO2: 27 mmol/L (ref 18–29)
CREATININE: 0.83 mg/dL (ref 0.57–1.00)
Calcium: 10.5 mg/dL — ABNORMAL HIGH (ref 8.7–10.3)
Chloride: 102 mmol/L (ref 96–106)
GFR calc Af Amer: 81 mL/min/{1.73_m2} (ref >59)
GFR, EST NON AFRICAN AMERICAN: 71 mL/min/{1.73_m2} (ref >59)
GLOBULIN, TOTAL: 2.9 g/dL (ref 1.5–4.5)
Glucose: 96 mg/dL (ref 65–99)
Potassium: 5.1 mmol/L (ref 3.5–5.2)
SODIUM: 141 mmol/L (ref 134–144)
TOTAL PROTEIN: 7 g/dL (ref 6.0–8.5)

## 2016-12-24 LAB — CBC WITH DIFFERENTIAL/PLATELET
Basophils Absolute: 0 10*3/uL (ref 0.0–0.2)
Basos: 0 %
EOS (ABSOLUTE): 0.1 10*3/uL (ref 0.0–0.4)
EOS: 2 %
HEMATOCRIT: 41.3 % (ref 34.0–46.6)
HEMOGLOBIN: 13.3 g/dL (ref 11.1–15.9)
Immature Grans (Abs): 0 10*3/uL (ref 0.0–0.1)
Immature Granulocytes: 0 %
LYMPHS ABS: 0.6 10*3/uL — AB (ref 0.7–3.1)
Lymphs: 11 %
MCH: 28.9 pg (ref 26.6–33.0)
MCHC: 32.2 g/dL (ref 31.5–35.7)
MCV: 90 fL (ref 79–97)
MONOCYTES: 7 %
Monocytes Absolute: 0.4 10*3/uL (ref 0.1–0.9)
NEUTROS ABS: 4.8 10*3/uL (ref 1.4–7.0)
Neutrophils: 80 %
Platelets: 327 10*3/uL (ref 150–379)
RBC: 4.6 x10E6/uL (ref 3.77–5.28)
RDW: 14 % (ref 12.3–15.4)
WBC: 6 10*3/uL (ref 3.4–10.8)

## 2016-12-24 LAB — LIPID PANEL
CHOL/HDL RATIO: 3.1 ratio (ref 0.0–4.4)
CHOLESTEROL TOTAL: 190 mg/dL (ref 100–199)
HDL: 62 mg/dL (ref >39)
LDL Calculated: 106 mg/dL — ABNORMAL HIGH (ref 0–99)
TRIGLYCERIDES: 109 mg/dL (ref 0–149)
VLDL Cholesterol Cal: 22 mg/dL (ref 5–40)

## 2017-01-12 ENCOUNTER — Other Ambulatory Visit: Payer: Self-pay | Admitting: Family Medicine

## 2017-02-24 ENCOUNTER — Encounter: Payer: Self-pay | Admitting: Family Medicine

## 2017-03-02 ENCOUNTER — Encounter: Payer: Self-pay | Admitting: *Deleted

## 2017-06-16 ENCOUNTER — Ambulatory Visit (INDEPENDENT_AMBULATORY_CARE_PROVIDER_SITE_OTHER): Payer: Medicare Other

## 2017-06-16 DIAGNOSIS — Z23 Encounter for immunization: Secondary | ICD-10-CM

## 2017-06-25 ENCOUNTER — Encounter: Payer: Self-pay | Admitting: Family Medicine

## 2017-06-25 ENCOUNTER — Ambulatory Visit (INDEPENDENT_AMBULATORY_CARE_PROVIDER_SITE_OTHER): Payer: Medicare Other | Admitting: Family Medicine

## 2017-06-25 VITALS — BP 130/77 | HR 83 | Temp 97.3°F | Ht 62.0 in | Wt 147.0 lb

## 2017-06-25 DIAGNOSIS — J4530 Mild persistent asthma, uncomplicated: Secondary | ICD-10-CM

## 2017-06-25 DIAGNOSIS — E782 Mixed hyperlipidemia: Secondary | ICD-10-CM | POA: Diagnosis not present

## 2017-06-25 DIAGNOSIS — E039 Hypothyroidism, unspecified: Secondary | ICD-10-CM

## 2017-06-25 MED ORDER — LEVOTHYROXINE SODIUM 75 MCG PO TABS: 75.0000 ug | ORAL_TABLET | Freq: Every day | ORAL | 5 refills | Status: DC

## 2017-06-25 MED ORDER — RISEDRONATE SODIUM 150 MG PO TABS: 150.0000 mg | ORAL_TABLET | ORAL | 3 refills | Status: DC

## 2017-06-25 NOTE — Progress Notes (Signed)
Subjective:  Patient ID: [name], female    DOB: 07-Nov-1945  Age: 71 y.o. MRN: [id number]  CC: Hyperlipidemia (pt here today for routine follow up of her chronic medical conditions, no other concerns voiced.)   HPI [name] presents for Patient in for follow-up of elevated cholesterol. Not currently medicated. Due for recheck of her level.  Patient presents for follow-up on  thyroid. The patient has a history of hypothyroidism for many years. It has been stable recently. Pt. denies any change in  voice, loss of hair, heat or cold intolerance. Energy level has been poor. Patient denies constipation and diarrhea. No myxedema. Medication is as noted below. Verified that pt is taking it daily on an empty stomach. Well tolerated.  Patient does complain of decreased energy. Her symptoms are referable primarily to fatigue as opposed to weakness and there is no focal component. She does not get winded easily when doing routine activities. Depression screen [name] 2/9 06/25/2017 12/23/2016 07/02/2016  Decreased Interest 0 0 0  Down, Depressed, Hopeless 0 0 0  PHQ - 2 Score 0 0 0    History [name] has a past medical history of Allergy; Asthma; Cataract; Colon polyps; Hyperlipidemia; Menopausal state; Osteopenia; Rhinitis; Thyroid disease; and Vitamin D deficiency disease.   She has a past surgical history that includes Tonsillectomy and Cholecystectomy (2005).   Her family history includes Asthma in her brother; Gout in her sister; Heart attack in her father; Heart disease in her father; Hyperlipidemia in her mother; Hypertension in her brother, mother, and sister; Osteoporosis in her mother; Thyroid disease in her mother.She reports that she has never smoked. She has never used smokeless tobacco. She reports that she does not drink alcohol or use drugs.    ROS Review of Systems  Constitutional: Negative for activity change, appetite change and fever.  HENT: Positive for congestion and  sneezing. Negative for rhinorrhea and sore throat.   Eyes: Negative for visual disturbance.  Respiratory: Negative for cough and shortness of breath.   Cardiovascular: Negative for chest pain and palpitations.  Gastrointestinal: Negative for abdominal pain, diarrhea and nausea.  Genitourinary: Negative for dysuria.  Musculoskeletal: Negative for arthralgias and myalgias.    Objective:  BP 130/77   Pulse 83   Temp (!) 97.3 F (36.3 C) (Oral)   Ht _0  (1.575 m)   Wt 147 lb (66.7 kg)   BMI 26.89 kg/m   BP Readings from Last 3 Encounters:  06/25/17 130/77  12/23/16 (!) 147/88  07/02/16 (!) 153/83    Wt Readings from Last 3 Encounters:  06/25/17 147 lb (66.7 kg)  12/23/16 142 lb (64.4 kg)  07/02/16 146 lb (66.2 kg)     Physical Exam  Constitutional: She is oriented to person, place, and time. She appears well-developed and well-nourished. No distress.  HENT:  Head: Normocephalic and atraumatic.  Right Ear: External ear normal.  Left Ear: External ear normal.  Nose: Nose normal.  Mouth/Throat: Oropharynx is clear and moist.  Eyes: Pupils are equal, round, and reactive to light. Conjunctivae and EOM are normal.  Neck: Normal range of motion. Neck supple. No thyromegaly present.  Cardiovascular: Normal rate, regular rhythm and normal heart sounds.   No murmur heard. Pulmonary/Chest: Effort normal and breath sounds normal. No respiratory distress. She has no wheezes. She has no rales.  Abdominal: Soft. Bowel sounds are normal. She exhibits no distension. There is no tenderness.  Lymphadenopathy:    She has no cervical adenopathy.  Neurological: She is alert and oriented to person, place, and time. She has normal reflexes.  Skin: Skin is warm and dry.  Psychiatric: She has a normal mood and affect. Her behavior is normal. Judgment and thought content normal.      Assessment & Plan:   [name] was seen today for hyperlipidemia.  Diagnoses and all orders for this  visit:  Mixed hyperlipidemia -     CBC with Differential/Platelet -     CMP14+EGFR -     Lipid panel  Hypothyroidism, unspecified type -     TSH -     T4, Free  Mild persistent chronic asthma without complication  Other orders -     levothyroxine (SYNTHROID, LEVOTHROID) 75 MCG tablet; Take 1 tablet (75 mcg total) by mouth daily. -     risedronate (ACTONEL) 150 MG tablet; Take 1 tablet (150 mg total) by mouth every 30 (thirty) days. with water, on an empty stomach, take nothing by mouth and to not lie down for 30 minutes after each dose.       I have changed [name]'s levothyroxine. I am also having her maintain her multivitamin with minerals, Vitamin D3, albuterol, fluticasone, ADVAIR DISKUS, and risedronate.  Allergies as of 06/25/2017      Reactions   Evista [raloxifene Hcl] Other (See Comments)   Bone pain, couldn't put weight on right leg/foot   Fosamax [alendronate Sodium]    Bone pain   Red Dye    Sulfa Antibiotics       Medication List       Accurate as of 06/25/17  1:54 PM. Always use your most recent med list.          ADVAIR DISKUS 100-50 MCG/DOSE Aepb Generic drug:  Fluticasone-Salmeterol INHALE ONE DOSE BY MOUTH TWICE DAILY   albuterol 108 (90 Base) MCG/ACT inhaler Commonly known as:  PROVENTIL HFA;VENTOLIN HFA Inhale 2 puffs into the lungs every 6 (six) hours as needed for wheezing.   fluticasone 50 MCG/ACT nasal spray Commonly known as:  FLONASE USE TWO SPRAY(S) IN EACH NOSTRIL ONCE DAILY   levothyroxine 75 MCG tablet Commonly known as:  SYNTHROID, LEVOTHROID Take 1 tablet (75 mcg total) by mouth daily.   multivitamin with minerals Tabs tablet Take 1 tablet by mouth daily.   risedronate 150 MG tablet Commonly known as:  ACTONEL Take 1 tablet (150 mg total) by mouth every 30 (thirty) days. with water, on an empty stomach, take nothing by mouth and to not lie down for 30 minutes after each dose.   Vitamin D3 3000 units Tabs Take 2,000  Units by mouth daily.        Follow-up: Return in about 6 months (around 12/23/2017).  [name], M.D.

## 2017-06-26 LAB — CBC WITH DIFFERENTIAL/PLATELET
Basophils Absolute: 0 10*3/uL (ref 0.0–0.2)
Basos: 1 %
EOS (ABSOLUTE): 0.2 10*3/uL (ref 0.0–0.4)
Eos: 4 %
HEMOGLOBIN: 13.8 g/dL (ref 11.1–15.9)
Hematocrit: 42.4 % (ref 34.0–46.6)
IMMATURE GRANS (ABS): 0 10*3/uL (ref 0.0–0.1)
IMMATURE GRANULOCYTES: 0 %
LYMPHS: 20 %
Lymphocytes Absolute: 1 10*3/uL (ref 0.7–3.1)
MCH: 29.8 pg (ref 26.6–33.0)
MCHC: 32.5 g/dL (ref 31.5–35.7)
MCV: 92 fL (ref 79–97)
MONOCYTES: 7 %
Monocytes Absolute: 0.4 10*3/uL (ref 0.1–0.9)
NEUTROS ABS: 3.4 10*3/uL (ref 1.4–7.0)
NEUTROS PCT: 68 %
PLATELETS: 308 10*3/uL (ref 150–379)
RBC: 4.63 x10E6/uL (ref 3.77–5.28)
RDW: 13.9 % (ref 12.3–15.4)
WBC: 5.1 10*3/uL (ref 3.4–10.8)

## 2017-06-26 LAB — LIPID PANEL
Chol/HDL Ratio: 3.2 ratio (ref 0.0–4.4)
Cholesterol, Total: 219 mg/dL — ABNORMAL HIGH (ref 100–199)
HDL: 68 mg/dL (ref >39)
LDL CALC: 126 mg/dL — AB (ref 0–99)
Triglycerides: 123 mg/dL (ref 0–149)
VLDL CHOLESTEROL CAL: 25 mg/dL (ref 5–40)

## 2017-06-26 LAB — CMP14+EGFR
ALBUMIN: 4.3 g/dL (ref 3.5–4.8)
ALT: 17 IU/L (ref 0–32)
AST: 16 IU/L (ref 0–40)
Albumin/Globulin Ratio: 1.3 (ref 1.2–2.2)
Alkaline Phosphatase: 64 IU/L (ref 39–117)
BILIRUBIN TOTAL: 0.5 mg/dL (ref 0.0–1.2)
BUN / CREAT RATIO: 8 — AB (ref 12–28)
BUN: 7 mg/dL — AB (ref 8–27)
CALCIUM: 10.4 mg/dL — AB (ref 8.7–10.3)
CHLORIDE: 102 mmol/L (ref 96–106)
CO2: 22 mmol/L (ref 20–29)
CREATININE: 0.89 mg/dL (ref 0.57–1.00)
GFR calc Af Amer: 74 mL/min/{1.73_m2} (ref >59)
GFR, EST NON AFRICAN AMERICAN: 65 mL/min/{1.73_m2} (ref >59)
GLUCOSE: 91 mg/dL (ref 65–99)
Globulin, Total: 3.2 g/dL (ref 1.5–4.5)
Potassium: 4.5 mmol/L (ref 3.5–5.2)
Sodium: 141 mmol/L (ref 134–144)
TOTAL PROTEIN: 7.5 g/dL (ref 6.0–8.5)

## 2017-06-26 LAB — T4, FREE: Free T4: 1.64 ng/dL (ref 0.82–1.77)

## 2017-06-26 LAB — TSH: TSH: 0.083 u[IU]/mL — AB (ref 0.450–4.500)

## 2017-07-15 ENCOUNTER — Other Ambulatory Visit: Payer: Self-pay | Admitting: Family Medicine

## 2017-10-26 ENCOUNTER — Ambulatory Visit (INDEPENDENT_AMBULATORY_CARE_PROVIDER_SITE_OTHER): Payer: Medicare Other | Admitting: *Deleted

## 2017-10-26 ENCOUNTER — Encounter: Payer: Self-pay | Admitting: *Deleted

## 2017-10-26 VITALS — BP 192/91 | HR 102 | Ht 60.0 in | Wt 150.0 lb

## 2017-10-26 DIAGNOSIS — M8589 Other specified disorders of bone density and structure, multiple sites: Secondary | ICD-10-CM

## 2017-10-26 DIAGNOSIS — Z780 Asymptomatic menopausal state: Secondary | ICD-10-CM

## 2017-10-26 DIAGNOSIS — Z0000 Encounter for general adult medical examination without abnormal findings: Secondary | ICD-10-CM

## 2017-10-26 NOTE — Patient Instructions (Addendum)
  [name] , Thank you for taking time to come for your Medicare Wellness Visit. I appreciate your ongoing commitment to your health goals. Please review the following plan we discussed and let me know if I can assist you in the future.   These are the goals we discussed: Goals    . DIET - INCREASE WATER INTAKE     6 to 8 glasses a day    . Exercise 3x per week (30 min per time)     Walk for 30 minutes at least 3 times per week.        This is a list of the screening recommended for you and due dates:  Health Maintenance  Topic Date Due  .  Hepatitis C: One time screening is recommended by Center for Disease Control  (CDC) for  adults born from [birth year] through 1967.   01-30-1946  . DEXA scan (bone density measurement)  05/01/2017  . Tetanus Vaccine  09/29/2017  . Colon Cancer Screening  10/27/2018*  . Mammogram  11/19/2017  . Flu Shot  Completed  . Pneumonia vaccines  Completed  *Topic was postponed. The date shown is not the original due date.     Your doctor has prescribed Cologuard, an easy-to-use, noninvasive test for colon cancer screening, based on the latest advances in stool DNA science.   Here's what will happen next:  1. You may receive a call or email from [facility] to confirm your mailing address and insurance information 2. Your kit will be shipped directly to you 3. You collet your stool sample in the privacy of your own home 4. You return the kit via [facility] shipping or pick-up, in the same box it arrived in 5. You doctor will contact you with the results once they are available  Screening for colon cancer is very important to your good health, so if you have any questions at all, please call [employer]'s Customer Support Specialists at [phone]. They are available 24 hours a day, 6 days a week.   I have provided you with paperwork for the Medicare Extra Help program that helps to pay prescription drug costs  Someone will call to schedule  your Dexa Scan

## 2017-10-26 NOTE — Progress Notes (Addendum)
Subjective:   [name] is a 72 y.o. female who presents for an Initial Medicare Annual Wellness Visit. [name] lives in a one story home with her 54 year old mother. She is active in the [employer] and enjoys reading and working crossword puzzles. She has an indoor cat that she recently brought in from being an outdoor cat. He isn't very social and doesn't pose a tripping hazard. She discontinued her Actonel due to cost.   Review of Systems    Health is about the same as last year  Cardiac Risk Factors include: advanced age (>49men, >49 women);sedentary lifestyle;dyslipidemia. Blood pressure and pulse is elevated today on initial check and rck. Patient denies any symptoms. No headache, chest discomfort, dizziness, or any other associated symptoms.   Other systems negative today    Objective:    Today's Vitals   10/26/17 1518 10/26/17 1551  BP: (!) 198/89 (!) 192/91  Pulse: (!) 110 (!) 102  Weight: 150 lb (68 kg)   Height: 5' (1.524 m)    Body mass index is 29.29 kg/m.  Advanced Directives 10/26/2017 07/02/2016 03/09/2015  Does Patient Have a Medical Advance Directive? No No No  Would patient like information on creating a medical advance directive? No - Patient declined Yes - Transport planner given Yes - Transport planner given    Current Medications (verified) Outpatient Encounter Medications as of 10/26/2017  Medication Sig  . ADVAIR DISKUS 100-50 MCG/DOSE AEPB INHALE 1 DOSE BY MOUTH TWICE DAILY  . albuterol (PROVENTIL HFA;VENTOLIN HFA) 108 (90 BASE) MCG/ACT inhaler Inhale 2 puffs into the lungs every 6 (six) hours as needed for wheezing.  . Cholecalciferol (VITAMIN D3) 3000 UNITS TABS Take 2,000 Units by mouth daily.   . fluticasone (FLONASE) 50 MCG/ACT nasal spray USE TWO SPRAY(S) IN EACH NOSTRIL ONCE DAILY  . levothyroxine (SYNTHROID, LEVOTHROID) 75 MCG tablet Take 1 tablet (75 mcg total) by mouth daily.  . Multiple Vitamin (MULTIVITAMIN WITH  MINERALS) TABS Take 1 tablet by mouth daily.  . risedronate (ACTONEL) 150 MG tablet Take 1 tablet (150 mg total) by mouth every 30 (thirty) days. with water, on an empty stomach, take nothing by mouth and to not lie down for 30 minutes after each dose. (Patient not taking: Reported on 10/26/2017)   No facility-administered encounter medications on file as of 10/26/2017.    She discontinued Actonel because of the cost  Allergies (verified) Evista [raloxifene hcl]; Fosamax [alendronate sodium]; Red dye; and Sulfa antibiotics   History: Past Medical History:  Diagnosis Date  . Allergy    seasonal  . Asthma   . Cataract   . Colon polyps   . Hyperlipidemia   . Menopausal state   . Osteopenia   . Rhinitis   . Thyroid disease   . Vitamin D deficiency disease    Past Surgical History:  Procedure Laterality Date  . CHOLECYSTECTOMY  2005  . TONSILLECTOMY     Family History  Problem Relation Age of Onset  . Hyperlipidemia Mother   . Hypertension Mother   . Thyroid disease Mother   . Osteoporosis Mother   . Heart disease Father   . Heart attack Father        heavy smoker  . Hypertension Sister   . Gout Sister   . Hypertension Brother   . Asthma Brother    Social History   Socioeconomic History  . Marital status: Single    Spouse name: Not on file  . Number  of children: 0  . Years of education: 98  . Highest education level: 12th grade  Social Needs  . Financial resource strain: Not hard at all  . Food insecurity - worry: Never true  . Food insecurity - inability: Never true  . Transportation needs - medical: No  . Transportation needs - non-medical: No  Occupational History  . 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 Smoking status: Never Smoker  . Smokeless tobacco: Never Used  Substance and Sexual Activity  . Alcohol use: No  . Drug use: No  . Sexual activity: No  Other Topics Concern  . Not on file    Social History Narrative   Lives at home with her mother. Never married and no children. Retired from [employer] in the [employer].     Clinical Intake:  Pre-visit preparation completed: No  Nutritional Status: BMI > 30  Obese Diabetes: No  How often do you need to have someone help you when you read instructions, pamphlets, or other written materials from your doctor or pharmacy?: 1 - Never What is the last grade level you completed in school?: 12  Interpreter Needed?: No  Information entered by :: Demetrios Loll, RN   Activities of Daily Living In your present state of health, do you have any difficulty performing the following activities: 10/26/2017  Hearing? N  Vision? N  Comment Last eye exam was in 06/2017. Seen at [facility]  Difficulty concentrating or making decisions? N  Walking or climbing stairs? N  Dressing or bathing? N  Doing errands, shopping? N  Preparing Food and eating ? N  Using the Toilet? N  In the past six months, have you accidently leaked urine? (No Data)  Comment Stress incontinence  Do you have problems with loss of bowel control? N  Managing your Medications? N  Managing your Finances? N  Housekeeping or managing your Housekeeping? N  Some recent data might be hidden     Immunizations and Health Maintenance Immunization History  Administered Date(s) Administered  . Influenza, High Dose Seasonal PF 06/16/2017  . Influenza,inj,Quad PF,6+ Mos 06/10/2013, 06/05/2014, 06/05/2015, 05/26/2016  . Pneumococcal Conjugate-13 12/26/2014  . Pneumococcal Polysaccharide-23 11/22/2012  . Td 09/30/2007   Health Maintenance Due  Topic Date Due  . Hepatitis C Screening  [birth year]  . DEXA SCAN  05/01/2017  . TETANUS/TDAP  09/29/2017    Patient Care Team: Mechele Claude, MD as PCP - General (Family Medicine) Ernesto Rutherford, MD as Consulting Physician (Ophthalmology)  No hospitalizations, ER visits, or surgeries this past year.       Assessment:   This is a routine wellness examination for [name].  Hearing/Vision screen No deficits noted during visit.  Dietary issues and exercise activities discussed: Current Exercise Habits: The patient does not participate in regular exercise at present, Exercise limited by: None identified  Goals    . DIET - INCREASE WATER INTAKE     6 to 8 glasses a day    . Exercise 3x per week (30 min per time)     Walk for 30 minutes at least 3 times per week.       Diet Admits to snacking and eating more junk food than she should. For lunch today she had meatloaf, fried okra, pasta salad and a cup of water. That is all of the water that she has had today. She normally drinks decaffeinated coffee and one  soda per day. She normally drinks about 3 glasses of water a day.   Depression Screen PHQ 2/9 Scores 10/26/2017 06/25/2017 12/23/2016 07/02/2016 06/25/2016 12/24/2015 06/28/2015  PHQ - 2 Score 0 0 0 0 0 0 0    Fall Risk Fall Risk  10/26/2017 06/25/2017 12/23/2016 07/02/2016 06/25/2016  Falls in the past year? No No No No No  Risk for fall due to : - - - - -    Cognitive Function: MMSE - Mini Mental State Exam 10/26/2017 07/02/2016 03/09/2015  Orientation to time 5 5 5   Orientation to Place 5 - 5  Registration 3 3 3   Attention/ Calculation 5 5 5   Recall 3 2 3   Language- name 2 objects 2 2 2   Language- repeat 1 1 1   Language- follow 3 step command 3 3 3   Language- read & follow direction 1 1 1   Write a sentence 1 1 1   Copy design 1 0 0  Total score 30 - 29  normal exam      Screening Tests Health Maintenance  Topic Date Due  . Hepatitis C Screening  01/09/1946  . DEXA SCAN  05/01/2017  . TETANUS/TDAP  09/29/2017  . COLONOSCOPY  10/27/2018 (Originally 10/20/2012)  . MAMMOGRAM  11/19/2017  . INFLUENZA VACCINE  Completed  . PNA vac Low Risk Adult  Completed     Plan:   Recommended Silver Sneakers or other exercise programs at the recreation department or [employer] Dexa ordered Cologuard  ordered Provided information about medicare's extra help program Keep f/u with PCP  Scheduled patient for a blood pressure check in a couple of days.  Asked her to increase water intake on a daily basis and to drink more today. High sodium content in her lunch may have affected her blood pressure reading today. Call if she develops any worrisome symptoms like a headache, dizziness, etc and call 911 if she feels it is an emergency.   I have personally reviewed and noted the following in the patient's chart:   . Medical and social history . Use of alcohol, tobacco or illicit drugs  . Current medications and supplements . Functional ability and status . Nutritional status . Physical activity . Advanced directives . List of other physicians . Hospitalizations, surgeries, and ER visits in previous 12 months . Vitals . Screenings to include cognitive, depression, and falls . Referrals and appointments  In addition, I have reviewed and discussed with patient certain preventive protocols, quality metrics, and best practice recommendations. A written personalized care plan for preventive services as well as general preventive health recommendations were provided to patient.     Demetrios LollKristen Alvino Lechuga, RN   10/26/2017     I have reviewed and agree with the above AWV documentation.  Mechele ClaudeWarren Stacks, M.D.

## 2017-10-29 ENCOUNTER — Ambulatory Visit (INDEPENDENT_AMBULATORY_CARE_PROVIDER_SITE_OTHER): Payer: Medicare Other | Admitting: *Deleted

## 2017-10-29 VITALS — BP 169/78 | HR 101

## 2017-10-29 DIAGNOSIS — Z0130 Encounter for examination of blood pressure without abnormal findings: Secondary | ICD-10-CM

## 2017-10-29 NOTE — Progress Notes (Signed)
Pt here for BP ck BP 174 81 P 100  BP rck BP 169 78 P 101  Pt will come back in on Monday for recheck Pt has modified diet but has been less active due to weather If still elevated will schedule appt with PCP

## 2017-11-02 ENCOUNTER — Ambulatory Visit (INDEPENDENT_AMBULATORY_CARE_PROVIDER_SITE_OTHER): Payer: Medicare Other

## 2017-11-02 ENCOUNTER — Other Ambulatory Visit: Payer: Self-pay | Admitting: Family Medicine

## 2017-11-02 DIAGNOSIS — Z780 Asymptomatic menopausal state: Secondary | ICD-10-CM | POA: Diagnosis not present

## 2017-11-02 DIAGNOSIS — M8589 Other specified disorders of bone density and structure, multiple sites: Secondary | ICD-10-CM | POA: Diagnosis not present

## 2017-11-02 DIAGNOSIS — Z13820 Encounter for screening for osteoporosis: Secondary | ICD-10-CM | POA: Diagnosis not present

## 2017-11-02 MED ORDER — ALENDRONATE SODIUM 70 MG PO TABS: 70.0000 mg | ORAL_TABLET | ORAL | 11 refills | Status: DC

## 2017-11-04 ENCOUNTER — Other Ambulatory Visit: Payer: Self-pay | Admitting: Family Medicine

## 2017-12-22 DIAGNOSIS — Z1231 Encounter for screening mammogram for malignant neoplasm of breast: Secondary | ICD-10-CM | POA: Diagnosis not present

## 2017-12-22 LAB — HM MAMMOGRAPHY

## 2017-12-23 ENCOUNTER — Ambulatory Visit (INDEPENDENT_AMBULATORY_CARE_PROVIDER_SITE_OTHER): Payer: Medicare Other | Admitting: Family Medicine

## 2017-12-23 ENCOUNTER — Encounter: Payer: Self-pay | Admitting: Family Medicine

## 2017-12-23 VITALS — BP 141/76 | HR 96 | Temp 96.9°F | Ht 60.0 in | Wt 144.5 lb

## 2017-12-23 DIAGNOSIS — E782 Mixed hyperlipidemia: Secondary | ICD-10-CM

## 2017-12-23 DIAGNOSIS — E039 Hypothyroidism, unspecified: Secondary | ICD-10-CM | POA: Diagnosis not present

## 2017-12-23 DIAGNOSIS — J4530 Mild persistent asthma, uncomplicated: Secondary | ICD-10-CM

## 2017-12-23 DIAGNOSIS — Z23 Encounter for immunization: Secondary | ICD-10-CM

## 2017-12-23 MED ORDER — LEVOTHYROXINE SODIUM 75 MCG PO TABS: 75.0000 ug | ORAL_TABLET | Freq: Every day | ORAL | 5 refills | Status: DC

## 2017-12-23 MED ORDER — AMOXICILLIN-POT CLAVULANATE 400-57 MG PO CHEW: 1.0000 | CHEWABLE_TABLET | Freq: Two times a day (BID) | ORAL | 0 refills | Status: DC

## 2017-12-23 MED ORDER — FLUTICASONE-SALMETEROL 100-50 MCG/DOSE IN AEPB: INHALATION_SPRAY | RESPIRATORY_TRACT | 5 refills | Status: DC

## 2017-12-23 NOTE — Progress Notes (Signed)
Subjective:  Patient ID: [name], female    DOB: Aug 24, 1946  Age: 72 y.o. MRN: [id number]  CC: Hyperlipidemia   HPI [name] presents for patient in for follow-up of elevated cholesterol.  She has not been taking medication.  She has been trying to watch her diet by controlling fat and cholesterol content.  Currently no chest pain, shortness of breath or other cardiovascular related symptoms noted.  Patient presents for follow-up on  thyroid. The patient has a history of hypothyroidism for many years. It has been stable recently. Pt. denies any change in  voice, loss of hair, heat or cold intolerance. Energy level has been adequate to good. Patient denies constipation and diarrhea. No myxedema. Medication is as noted below. Verified that pt is taking it daily on an empty stomach. Well tolerated.  Patient is having some asthma symptoms based on the pollen in the air.  Nothing serious just occasionally having to use an inhaler.  Her Advair seems to control things well and she occasionally uses the albuterol.  Depression screen [facility] 2/9 12/23/2017 10/26/2017 06/25/2017  Decreased Interest 0 0 0  Down, Depressed, Hopeless 0 0 0  PHQ - 2 Score 0 0 0    History [name] has a past medical history of Allergy, Asthma, Cataract, Colon polyps, Hyperlipidemia, Menopausal state, Osteopenia, Rhinitis, Thyroid disease, and Vitamin D deficiency disease.   She has a past surgical history that includes Tonsillectomy and Cholecystectomy (2005).   Her family history includes Asthma in her brother; Gout in her sister; Heart attack in her father; Heart disease in her father; Hyperlipidemia in her mother; Hypertension in her brother, mother, and sister; Osteoporosis in her mother; Thyroid disease in her mother.She reports that she has never smoked. She has never used smokeless tobacco. She reports that she does not drink alcohol or use drugs.    ROS Review of Systems  Constitutional: Negative.   HENT:  Negative for congestion.   Eyes: Negative for visual disturbance.  Respiratory: Negative for shortness of breath.   Cardiovascular: Negative for chest pain.  Gastrointestinal: Negative for abdominal pain, constipation, diarrhea, nausea and vomiting.  Genitourinary: Negative for difficulty urinating.  Musculoskeletal: Negative for arthralgias and myalgias.  Neurological: Negative for headaches.  Psychiatric/Behavioral: Negative for sleep disturbance.    Objective:  BP (!) 141/76   Pulse 96   Temp (!) 96.9 F (36.1 C) (Oral)   Ht 5' (1.524 m)   Wt 144 lb 8 oz (65.5 kg)   BMI 28.22 kg/m   BP Readings from Last 3 Encounters:  12/23/17 (!) 141/76  10/29/17 (!) 169/78  10/26/17 (!) 192/91    Wt Readings from Last 3 Encounters:  12/23/17 144 lb 8 oz (65.5 kg)  10/26/17 150 lb (68 kg)  06/25/17 147 lb (66.7 kg)     Physical Exam  Constitutional: She is oriented to person, place, and time. She appears well-developed and well-nourished. No distress.  HENT:  Head: Normocephalic and atraumatic.  Right Ear: External ear normal.  Left Ear: External ear normal.  Nose: Nose normal.  Mouth/Throat: Oropharynx is clear and moist.  Eyes: Pupils are equal, round, and reactive to light. Conjunctivae and EOM are normal.  Neck: Normal range of motion. Neck supple. No thyromegaly present.  Cardiovascular: Normal rate, regular rhythm and normal heart sounds.  No murmur heard. Pulmonary/Chest: Effort normal. No respiratory distress. She has wheezes (Occasional scattered). She has no rales.  Abdominal: Soft. Bowel sounds are normal. She exhibits no  distension. There is no tenderness.  Lymphadenopathy:    She has no cervical adenopathy.  Neurological: She is alert and oriented to person, place, and time. She has normal reflexes.  Skin: Skin is warm and dry.  Psychiatric: She has a normal mood and affect. Her behavior is normal. Judgment and thought content normal.      Assessment & Plan:     [name] was seen today for hyperlipidemia.  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 -     Fluticasone-Salmeterol (ADVAIR DISKUS) 100-50 MCG/DOSE AEPB; INHALE 1 DOSE BY MOUTH TWICE DAILY -     levothyroxine (SYNTHROID, LEVOTHROID) 75 MCG tablet; Take 1 tablet (75 mcg total) by mouth daily.       I have changed [name]'s ADVAIR DISKUS to Fluticasone-Salmeterol. I am also having her start on amoxicillin-clavulanate. Additionally, I am having her maintain her multivitamin with minerals, Vitamin D3, albuterol, alendronate, fluticasone, and levothyroxine.  Allergies as of 12/23/2017      Reactions   Evista [raloxifene Hcl] Other (See Comments)   Bone pain, couldn't put weight on right leg/foot   Fosamax [alendronate Sodium]    Bone pain   Red Dye    Sulfa Antibiotics       Medication List        Accurate as of 12/23/17 11:34 AM. Always use your most recent med list.          albuterol 108 (90 Base) MCG/ACT inhaler Commonly known as:  PROVENTIL HFA;VENTOLIN HFA Inhale 2 puffs into the lungs every 6 (six) hours as needed for wheezing.   alendronate 70 MG tablet Commonly known as:  FOSAMAX Take 1 tablet (70 mg total) by mouth every 7 (seven) days. Take with a full glass of water on an empty stomach.   amoxicillin-clavulanate 400-57 MG chewable tablet Commonly known as:  AUGMENTIN Chew 1 tablet by mouth 2 (two) times daily.   fluticasone 50 MCG/ACT nasal spray Commonly known as:  FLONASE USE TWO SPRAY(S) IN EACH NOSTRIL ONCE DAILY   Fluticasone-Salmeterol 100-50 MCG/DOSE  Aepb Commonly known as:  ADVAIR DISKUS INHALE 1 DOSE BY MOUTH TWICE DAILY   levothyroxine 75 MCG tablet Commonly known as:  SYNTHROID, LEVOTHROID Take 1 tablet (75 mcg total) by mouth daily.   multivitamin with minerals Tabs tablet Take 1 tablet by mouth daily.   Vitamin D3 3000 units Tabs Take 2,000 Units by mouth daily.        Follow-up: Return in about 6 months (around 06/25/2018).  [name], M.D.

## 2017-12-24 LAB — CBC WITH DIFFERENTIAL/PLATELET
BASOS: 1 %
Basophils Absolute: 0 10*3/uL (ref 0.0–0.2)
EOS (ABSOLUTE): 0.2 10*3/uL (ref 0.0–0.4)
EOS: 3 %
HEMATOCRIT: 41.6 % (ref 34.0–46.6)
HEMOGLOBIN: 13.7 g/dL (ref 11.1–15.9)
IMMATURE GRANULOCYTES: 0 %
Immature Grans (Abs): 0 10*3/uL (ref 0.0–0.1)
Lymphocytes Absolute: 0.8 10*3/uL (ref 0.7–3.1)
Lymphs: 17 %
MCH: 29.6 pg (ref 26.6–33.0)
MCHC: 32.9 g/dL (ref 31.5–35.7)
MCV: 90 fL (ref 79–97)
MONOS ABS: 0.4 10*3/uL (ref 0.1–0.9)
Monocytes: 8 %
NEUTROS PCT: 71 %
Neutrophils Absolute: 3.3 10*3/uL (ref 1.4–7.0)
Platelets: 291 10*3/uL (ref 150–379)
RBC: 4.63 x10E6/uL (ref 3.77–5.28)
RDW: 14.3 % (ref 12.3–15.4)
WBC: 4.6 10*3/uL (ref 3.4–10.8)

## 2017-12-24 LAB — CMP14+EGFR
ALBUMIN: 4.2 g/dL (ref 3.5–4.8)
ALT: 13 IU/L (ref 0–32)
AST: 15 IU/L (ref 0–40)
Albumin/Globulin Ratio: 1.5 (ref 1.2–2.2)
Alkaline Phosphatase: 62 IU/L (ref 39–117)
BILIRUBIN TOTAL: 0.6 mg/dL (ref 0.0–1.2)
BUN/Creatinine Ratio: 9 — ABNORMAL LOW (ref 12–28)
BUN: 8 mg/dL (ref 8–27)
CHLORIDE: 105 mmol/L (ref 96–106)
CO2: 24 mmol/L (ref 20–29)
Calcium: 10.5 mg/dL — ABNORMAL HIGH (ref 8.7–10.3)
Creatinine, Ser: 0.9 mg/dL (ref 0.57–1.00)
GFR, EST AFRICAN AMERICAN: 73 mL/min/{1.73_m2} (ref >59)
GFR, EST NON AFRICAN AMERICAN: 64 mL/min/{1.73_m2} (ref >59)
GLUCOSE: 90 mg/dL (ref 65–99)
Globulin, Total: 2.8 g/dL (ref 1.5–4.5)
POTASSIUM: 5 mmol/L (ref 3.5–5.2)
SODIUM: 143 mmol/L (ref 134–144)
Total Protein: 7 g/dL (ref 6.0–8.5)

## 2017-12-24 LAB — T4, FREE: FREE T4: 1.67 ng/dL (ref 0.82–1.77)

## 2017-12-24 LAB — TSH: TSH: 0.062 u[IU]/mL — AB (ref 0.450–4.500)

## 2017-12-24 LAB — LIPID PANEL
CHOL/HDL RATIO: 3.2 ratio (ref 0.0–4.4)
Cholesterol, Total: 211 mg/dL — ABNORMAL HIGH (ref 100–199)
HDL: 66 mg/dL (ref >39)
LDL Calculated: 121 mg/dL — ABNORMAL HIGH (ref 0–99)
Triglycerides: 118 mg/dL (ref 0–149)
VLDL Cholesterol Cal: 24 mg/dL (ref 5–40)

## 2018-06-04 ENCOUNTER — Ambulatory Visit (INDEPENDENT_AMBULATORY_CARE_PROVIDER_SITE_OTHER): Payer: Medicare Other | Admitting: *Deleted

## 2018-06-04 DIAGNOSIS — Z23 Encounter for immunization: Secondary | ICD-10-CM | POA: Diagnosis not present

## 2018-06-25 ENCOUNTER — Encounter: Payer: Self-pay | Admitting: Family Medicine

## 2018-06-25 ENCOUNTER — Ambulatory Visit (INDEPENDENT_AMBULATORY_CARE_PROVIDER_SITE_OTHER): Payer: Medicare Other | Admitting: Family Medicine

## 2018-06-25 VITALS — BP 141/73 | HR 79 | Temp 97.6°F | Ht 60.0 in | Wt 146.0 lb

## 2018-06-25 DIAGNOSIS — E039 Hypothyroidism, unspecified: Secondary | ICD-10-CM

## 2018-06-25 DIAGNOSIS — J4540 Moderate persistent asthma, uncomplicated: Secondary | ICD-10-CM

## 2018-06-25 DIAGNOSIS — Z1211 Encounter for screening for malignant neoplasm of colon: Secondary | ICD-10-CM | POA: Diagnosis not present

## 2018-06-25 DIAGNOSIS — E782 Mixed hyperlipidemia: Secondary | ICD-10-CM | POA: Diagnosis not present

## 2018-06-25 MED ORDER — ALENDRONATE SODIUM 70 MG PO TABS: 70.0000 mg | ORAL_TABLET | ORAL | 1 refills | Status: DC

## 2018-06-25 MED ORDER — ALBUTEROL SULFATE HFA 108 (90 BASE) MCG/ACT IN AERS: 2.0000 | INHALATION_SPRAY | Freq: Four times a day (QID) | RESPIRATORY_TRACT | 11 refills | Status: DC | PRN

## 2018-06-25 MED ORDER — FLUTICASONE PROPIONATE 50 MCG/ACT NA SUSP: NASAL | 1 refills | Status: DC

## 2018-06-25 MED ORDER — FLUTICASONE-SALMETEROL 100-50 MCG/DOSE IN AEPB: INHALATION_SPRAY | RESPIRATORY_TRACT | 1 refills | Status: DC

## 2018-06-25 MED ORDER — LEVOTHYROXINE SODIUM 75 MCG PO TABS: 75.0000 ug | ORAL_TABLET | Freq: Every day | ORAL | 1 refills | Status: DC

## 2018-06-25 MED ORDER — KRILL OIL 300 MG PO CAPS: 1.0000 | ORAL_CAPSULE | Freq: Two times a day (BID) | ORAL | 1 refills | Status: DC

## 2018-06-25 NOTE — Progress Notes (Signed)
Subjective:  Patient ID: [name], female    DOB: June 04, 1946  Age: 72 y.o. MRN: [id number]  CC: Hypothyroidism (6 mos ckup); Hyperlipidemia; and Asthma   HPI [name] presents for patient presents for follow-up on  thyroid. The patient has a history of hypothyroidism for many years. It has been stable recently. Pt. denies any change in  voice, loss of hair, heat or cold intolerance. Energy level has been adequate to good. Patient denies constipation and diarrhea. No myxedema. Medication is as noted below. Verified that pt is taking it daily on an empty stomach. Well tolerated.  Patient here for follow-up of asthma but doing quite well with no concerns.  Medications as listed below.  Patient also is not taking anything for cholesterol.  She has a mildly elevated total and LDL on recent testing but the HDL is very high and protective.  No prescription medication is desired.  She would like to have Cologuard screening instead of colonoscopy this time.  Depression screen [facility] 2/9 06/25/2018 12/23/2017 10/26/2017  Decreased Interest 0 0 0  Down, Depressed, Hopeless 0 0 0  PHQ - 2 Score 0 0 0    History [name] has a past medical history of Allergy, Asthma, Cataract, Colon polyps, Hyperlipidemia, Menopausal state, Osteopenia, Rhinitis, Thyroid disease, and Vitamin D deficiency disease.   She has a past surgical history that includes Tonsillectomy and Cholecystectomy (2005).   Her family history includes Asthma in her brother; Gout in her sister; Heart attack in her father; Heart disease in her father; Hyperlipidemia in her mother; Hypertension in her brother, mother, and sister; Osteoporosis in her mother; Thyroid disease in her mother.She reports that she has never smoked. She has never used smokeless tobacco. She reports that she does not drink alcohol or use drugs.    ROS Review of Systems  Constitutional: Negative.   HENT: Negative for congestion.   Eyes: Negative for visual  disturbance.  Respiratory: Negative for shortness of breath.   Cardiovascular: Negative for chest pain.  Gastrointestinal: Negative for abdominal pain, constipation, diarrhea, nausea and vomiting.  Genitourinary: Negative for difficulty urinating.  Musculoskeletal: Negative for arthralgias and myalgias.  Neurological: Negative for headaches.  Psychiatric/Behavioral: Negative for sleep disturbance.    Objective:  BP (!) 141/73   Pulse 79   Temp 97.6 F (36.4 C) (Oral)   Ht 5' (1.524 m)   Wt 146 lb (66.2 kg)   BMI 28.51 kg/m   BP Readings from Last 3 Encounters:  06/25/18 (!) 141/73  12/23/17 (!) 141/76  10/29/17 (!) 169/78    Wt Readings from Last 3 Encounters:  06/25/18 146 lb (66.2 kg)  12/23/17 144 lb 8 oz (65.5 kg)  10/26/17 150 lb (68 kg)     Physical Exam  Constitutional: She is oriented to person, place, and time. She appears well-developed and well-nourished. No distress.  HENT:  Head: Normocephalic and atraumatic.  Right Ear: External ear normal.  Left Ear: External ear normal.  Nose: Nose normal.  Mouth/Throat: Oropharynx is clear and moist.  Eyes: Pupils are equal, round, and reactive to light. Conjunctivae and EOM are normal.  Neck: Normal range of motion. Neck supple. No thyromegaly present.  Cardiovascular: Normal rate, regular rhythm and normal heart sounds.  No murmur heard. Pulmonary/Chest: Effort normal and breath sounds normal. No respiratory distress. She has no wheezes. She has no rales.  Abdominal: Soft. Bowel sounds are normal. She exhibits no distension. There is no tenderness.  Lymphadenopathy:  She has no cervical adenopathy.  Neurological: She is alert and oriented to person, place, and time. She has normal reflexes.  Skin: Skin is warm and dry.  Psychiatric: She has a normal mood and affect. Her behavior is normal. Judgment and thought content normal.      Assessment & Plan:   [name] was seen today for hypothyroidism,  hyperlipidemia and asthma.  Diagnoses and all orders for this visit:  Hypothyroidism, unspecified type -     TSH -     T4, Free  Mixed hyperlipidemia -     CBC with Differential/Platelet -     CMP14+EGFR  Colon cancer screening -     Cologuard  Asthma, chronic, moderate persistent, uncomplicated -     albuterol (PROVENTIL HFA;VENTOLIN HFA) 108 (90 Base) MCG/ACT inhaler; Inhale 2 puffs into the lungs every 6 (six) hours as needed for wheezing.  Other orders -     Krill Oil 300 MG CAPS; Take 1 capsule (300 mg total) by mouth 2 (two) times daily. -     levothyroxine (SYNTHROID, LEVOTHROID) 75 MCG tablet; Take 1 tablet (75 mcg total) by mouth daily. -     Fluticasone-Salmeterol (ADVAIR DISKUS) 100-50 MCG/DOSE AEPB; INHALE 1 DOSE BY MOUTH TWICE DAILY -     fluticasone (FLONASE) 50 MCG/ACT nasal spray; USE TWO SPRAY(S) IN EACH NOSTRIL ONCE DAILY -     alendronate (FOSAMAX) 70 MG tablet; Take 1 tablet (70 mg total) by mouth every 7 (seven) days. Take with a full glass of water on an empty stomach.       I have discontinued Martiza P. Bodmer's amoxicillin-clavulanate. I am also having her start on [place]. Additionally, I am having her maintain her multivitamin with minerals, Vitamin D3, levothyroxine, Fluticasone-Salmeterol, fluticasone, alendronate, and albuterol.  Allergies as of 06/25/2018      Reactions   Evista [raloxifene Hcl] Other (See Comments)   Bone pain, couldn't put weight on right leg/foot   Fosamax [alendronate Sodium]    Bone pain   Red Dye    Sulfa Antibiotics       Medication List        Accurate as of 06/25/18  9:33 AM. Always use your most recent med list.          albuterol 108 (90 Base) MCG/ACT inhaler Commonly known as:  PROVENTIL HFA;VENTOLIN HFA Inhale 2 puffs into the lungs every 6 (six) hours as needed for wheezing.   alendronate 70 MG tablet Commonly known as:  FOSAMAX Take 1 tablet (70 mg total) by mouth every 7 (seven) days. Take with a full  glass of water on an empty stomach.   fluticasone 50 MCG/ACT nasal spray Commonly known as:  FLONASE USE TWO SPRAY(S) IN EACH NOSTRIL ONCE DAILY   Fluticasone-Salmeterol 100-50 MCG/DOSE Aepb Commonly known as:  ADVAIR INHALE 1 DOSE BY MOUTH TWICE DAILY   Krill Oil 300 MG Caps Take 1 capsule (300 mg total) by mouth 2 (two) times daily.   levothyroxine 75 MCG tablet Commonly known as:  SYNTHROID, LEVOTHROID Take 1 tablet (75 mcg total) by mouth daily.   multivitamin with minerals Tabs tablet Take 1 tablet by mouth daily.   Vitamin D3 3000 units Tabs Take 2,000 Units by mouth daily.      I will try to order Cologuard for her.  Apparently if there has been polyps in the past she is not eligible.  We will look into this for her.  Follow-up: Return in about 6  months (around 12/24/2018) for CPE.  [name], M.D.

## 2018-06-26 LAB — CMP14+EGFR
ALT: 12 IU/L (ref 0–32)
AST: 16 IU/L (ref 0–40)
Albumin/Globulin Ratio: 1.6 (ref 1.2–2.2)
Albumin: 4.1 g/dL (ref 3.5–4.8)
Alkaline Phosphatase: 60 IU/L (ref 39–117)
BILIRUBIN TOTAL: 0.5 mg/dL (ref 0.0–1.2)
BUN / CREAT RATIO: 11 — AB (ref 12–28)
BUN: 10 mg/dL (ref 8–27)
CHLORIDE: 106 mmol/L (ref 96–106)
CO2: 21 mmol/L (ref 20–29)
Calcium: 10.4 mg/dL — ABNORMAL HIGH (ref 8.7–10.3)
Creatinine, Ser: 0.89 mg/dL (ref 0.57–1.00)
GFR calc non Af Amer: 64 mL/min/{1.73_m2} (ref >59)
GFR, EST AFRICAN AMERICAN: 74 mL/min/{1.73_m2} (ref >59)
Globulin, Total: 2.6 g/dL (ref 1.5–4.5)
Glucose: 89 mg/dL (ref 65–99)
Potassium: 4.4 mmol/L (ref 3.5–5.2)
SODIUM: 143 mmol/L (ref 134–144)
TOTAL PROTEIN: 6.7 g/dL (ref 6.0–8.5)

## 2018-06-26 LAB — CBC WITH DIFFERENTIAL/PLATELET
Basophils Absolute: 0.1 10*3/uL (ref 0.0–0.2)
Basos: 1 %
EOS (ABSOLUTE): 0.1 10*3/uL (ref 0.0–0.4)
Eos: 3 %
Hematocrit: 41 % (ref 34.0–46.6)
Hemoglobin: 13.3 g/dL (ref 11.1–15.9)
IMMATURE GRANS (ABS): 0 10*3/uL (ref 0.0–0.1)
Immature Granulocytes: 0 %
LYMPHS: 18 %
Lymphocytes Absolute: 0.8 10*3/uL (ref 0.7–3.1)
MCH: 28.9 pg (ref 26.6–33.0)
MCHC: 32.4 g/dL (ref 31.5–35.7)
MCV: 89 fL (ref 79–97)
Monocytes Absolute: 0.3 10*3/uL (ref 0.1–0.9)
Monocytes: 7 %
NEUTROS ABS: 3.1 10*3/uL (ref 1.4–7.0)
Neutrophils: 71 %
PLATELETS: 293 10*3/uL (ref 150–450)
RBC: 4.6 x10E6/uL (ref 3.77–5.28)
RDW: 13.1 % (ref 12.3–15.4)
WBC: 4.5 10*3/uL (ref 3.4–10.8)

## 2018-06-26 LAB — TSH: TSH: 0.061 u[IU]/mL — ABNORMAL LOW (ref 0.450–4.500)

## 2018-06-26 LAB — T4, FREE: Free T4: 1.67 ng/dL (ref 0.82–1.77)

## 2018-06-27 NOTE — Progress Notes (Signed)
Hello Aashvi,  Your lab result is normal.Some minor variations that are not significant are commonly marked abnormal, but do not represent any medical problem for you.  Best regards, Lyndsi Altic, M.D.

## 2018-12-23 ENCOUNTER — Other Ambulatory Visit: Payer: Self-pay

## 2018-12-23 ENCOUNTER — Ambulatory Visit (INDEPENDENT_AMBULATORY_CARE_PROVIDER_SITE_OTHER): Payer: Medicare Other | Admitting: Family Medicine

## 2018-12-23 DIAGNOSIS — E039 Hypothyroidism, unspecified: Secondary | ICD-10-CM | POA: Diagnosis not present

## 2018-12-23 DIAGNOSIS — J4530 Mild persistent asthma, uncomplicated: Secondary | ICD-10-CM | POA: Diagnosis not present

## 2018-12-23 DIAGNOSIS — E782 Mixed hyperlipidemia: Secondary | ICD-10-CM

## 2018-12-23 DIAGNOSIS — R1084 Generalized abdominal pain: Secondary | ICD-10-CM | POA: Diagnosis not present

## 2018-12-23 MED ORDER — LEVOTHYROXINE SODIUM 75 MCG PO TABS: 75.0000 ug | ORAL_TABLET | Freq: Every day | ORAL | 1 refills | Status: DC

## 2018-12-23 MED ORDER — ALENDRONATE SODIUM 70 MG PO TABS: 70.0000 mg | ORAL_TABLET | ORAL | 1 refills | Status: DC

## 2018-12-23 MED ORDER — FLUTICASONE-SALMETEROL 100-50 MCG/DOSE IN AEPB: INHALATION_SPRAY | RESPIRATORY_TRACT | 1 refills | Status: DC

## 2018-12-23 NOTE — Progress Notes (Signed)
Subjective:    Patient ID: [name], female    DOB: Oct 05, 1945, 73 y.o.   MRN: [id number]   HPI: TERRESA [name] is a 73 y.o. female presenting for patient presents for follow-up on  thyroid. The patient has a history of hypothyroidism for many years. It has been stable recently. Pt. denies any change in  voice, loss of hair, heat or cold intolerance. Energy level has been adequate to good. Patient denies constipation and diarrhea. No myxedema. Medication is as noted below. Verified that pt is taking it daily on an empty stomach. Well tolerated.  Patient in for follow-up of elevated cholesterol. Doing well without complaints on current medication. Denies side effects of statin including myalgia and arthralgia and nausea. Also in today for liver function testing. Currently no chest pain, shortness of breath or other cardiovascular related symptoms noted.  Patient using Advair regularly and supplementing occasionally with the Ventolin but no acute exacerbations of her asthma have been noted since last checkup.   Depression screen [facility] 2/9 06/25/2018 12/23/2017 10/26/2017 06/25/2017 12/23/2016  Decreased Interest 0 0 0 0 0  Down, Depressed, Hopeless 0 0 0 0 0  PHQ - 2 Score 0 0 0 0 0     Relevant past medical, surgical, family and social history reviewed and updated as indicated.  Interim medical history since our last visit reviewed. Allergies and medications reviewed and updated.  ROS:  Review of Systems  Constitutional: Negative.   HENT: Negative for congestion.   Eyes: Negative for visual disturbance.  Respiratory: Negative for shortness of breath.   Cardiovascular: Negative for chest pain.  Gastrointestinal: Positive for abdominal distention (swells up and down at surgical site. Can be bad at times.BM eases it.  Metamucil helps) and abdominal pain. Negative for constipation, diarrhea, nausea and vomiting.  Genitourinary: Negative for difficulty urinating.  Musculoskeletal: Negative  for arthralgias and myalgias.  Neurological: Negative for headaches.  Psychiatric/Behavioral: Negative for sleep disturbance.     Social History   Tobacco Use  Smoking Status Never Smoker  Smokeless Tobacco Never Used       Objective:     Wt Readings from Last 3 Encounters:  06/25/18 146 lb (66.2 kg)  12/23/17 144 lb 8 oz (65.5 kg)  10/26/17 150 lb (68 kg)     Exam deferred. Pt. Harboring due to COVID 19. Phone visit performed.   Assessment & Plan:   1. Hypothyroidism, unspecified type   2. Mixed hyperlipidemia   3. 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 Generalized abdominal pain     Meds ordered this encounter  Medications  . levothyroxine (SYNTHROID) 75 MCG tablet    Sig: Take 1 tablet (75 mcg total) by mouth daily.    Dispense:  90 tablet    Refill:  1    Please consider 90 day supplies to promote better adherence  . Fluticasone-Salmeterol (ADVAIR DISKUS) 100-50 MCG/DOSE AEPB    Sig: INHALE 1 DOSE BY MOUTH TWICE DAILY    Dispense:  180 each    Refill:  1    Please consider 90 day supplies to promote better adherence  . alendronate (FOSAMAX) 70 MG tablet    Sig: Take 1 tablet (70 mg total) by mouth every 7 (seven) days. Take with a full glass of water on an empty stomach.    Dispense:  12 tablet    Refill:  1    No orders of the defined types were placed in this  encounter.     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 -     Fluticasone-Salmeterol (ADVAIR DISKUS) 100-50 MCG/DOSE AEPB; INHALE 1 DOSE BY MOUTH TWICE DAILY -     alendronate (FOSAMAX) 70 MG tablet; Take 1 tablet (70 mg total) by mouth every 7 (seven) days. Take with a full glass of water on an empty stomach.    Virtual Visit  via telephone Note  I discussed the limitations, risks, security and privacy concerns of performing an evaluation and management service by telephone and the availability of in person appointments. The patient was identified with two identifiers. Pt.expressed understanding and agreed to proceed. Pt. Is at home. Dr. Darlyn ReadStacks is in his office.  Follow Up Instructions:   I discussed the assessment and treatment plan with the patient. The patient was provided an opportunity to ask questions and all were answered. The patient agreed with the plan and demonstrated an understanding of the instructions.   The patient was advised to call back or seek an in-person evaluation if the symptoms worsen or if the condition fails to improve as anticipated.  Visit started: 8:25 Call ended:  8:41 Total minutes including chart review and phone contact time: 26   Follow up plan: Return in about 6 months (around 06/24/2019) for CPE.  Mechele ClaudeWarren Graviela Nodal, MD [facility]

## 2018-12-24 ENCOUNTER — Ambulatory Visit: Payer: Medicare Other | Admitting: Family Medicine

## 2018-12-26 ENCOUNTER — Encounter: Payer: Self-pay | Admitting: Family Medicine

## 2019-03-07 ENCOUNTER — Telehealth: Payer: Self-pay | Admitting: Family Medicine

## 2019-03-07 NOTE — Telephone Encounter (Signed)
appt scheduled Pt notified 

## 2019-03-08 ENCOUNTER — Other Ambulatory Visit: Payer: Self-pay

## 2019-03-08 ENCOUNTER — Ambulatory Visit (INDEPENDENT_AMBULATORY_CARE_PROVIDER_SITE_OTHER): Payer: Medicare Other | Admitting: Family Medicine

## 2019-03-08 ENCOUNTER — Encounter: Payer: Self-pay | Admitting: Family Medicine

## 2019-03-08 VITALS — BP 187/86 | HR 103 | Temp 97.9°F | Ht 61.0 in | Wt 129.6 lb

## 2019-03-08 DIAGNOSIS — B029 Zoster without complications: Secondary | ICD-10-CM

## 2019-03-08 MED ORDER — VALACYCLOVIR HCL 1 G PO TABS: 1000.0000 mg | ORAL_TABLET | Freq: Three times a day (TID) | ORAL | 0 refills | Status: DC

## 2019-03-08 NOTE — Progress Notes (Signed)
Chief Complaint  Patient presents with  . Rash    right side of neck. Patient states it showed up yesterday    HPI  Patient presents today for itching irritated rash for one day. Located at the right lateral neck and over to the shoulder. Hasn't had fever or cough. No shingles shot ever.  PMH: Smoking status noted ROS: Per HPI  Objective: BP (!) 187/86   Pulse (!) 103   Temp 97.9 F (36.6 C) (Oral)   Ht 5\' 1"  (1.549 m)   Wt 129 lb 9.6 oz (58.8 kg)   BMI 24.49 kg/m  Gen: NAD, alert, cooperative with exam HEENT: NCAT, EOMI, PERRL Skin: erythematous nodular eruption along a dermatomal pattern. Does not cross midline. Tender to palpation. Ext: No edema, warm Neuro: Alert and oriented, No gross deficits  Assessment and plan:  1. Herpes zoster without complication     Meds ordered this encounter  Medications  . valACYclovir (VALTREX) 1000 MG tablet    Sig: Take 1 tablet (1,000 mg total) by mouth 3 (three) times daily.    Dispense:  21 tablet    Refill:  0    No orders of the defined types were placed in this encounter.   Follow up as needed.  [name], MD

## 2019-03-10 ENCOUNTER — Encounter: Payer: Self-pay | Admitting: *Deleted

## 2019-03-10 ENCOUNTER — Ambulatory Visit (INDEPENDENT_AMBULATORY_CARE_PROVIDER_SITE_OTHER): Payer: Medicare Other | Admitting: *Deleted

## 2019-03-10 VITALS — Ht 61.0 in | Wt 129.0 lb

## 2019-03-10 DIAGNOSIS — Z0000 Encounter for general adult medical examination without abnormal findings: Secondary | ICD-10-CM

## 2019-03-10 NOTE — Progress Notes (Signed)
MEDICARE ANNUAL WELLNESS VISIT  03/10/2019  Telephone Visit Disclaimer This Medicare AWV was conducted by telephone due to national recommendations for restrictions regarding the COVID-19 Pandemic (e.g. social distancing).  I verified, using two identifiers, that I am speaking with [name] or their authorized healthcare agent. I discussed the limitations, risks, security, and privacy concerns of performing an evaluation and management service by telephone and the potential availability of an in-person appointment in the future. The patient expressed understanding and agreed to proceed.   Subjective:  [name] is a 73 y.o. female patient of Stacks, Cletus Gash, MD who had a Medicare Annual Wellness Visit today via telephone. Thao is Retired and lives with their family. she has 0 children. she reports that she is socially active and does interact with friends/family regularly. she is not physically active and enjoys reading.  Patient Care Team: [name], MD as PCP - General (Family Medicine) Clent Jacks, MD as Consulting Physician (Ophthalmology)  Advanced Directives 03/10/2019 10/26/2017 07/02/2016 03/09/2015  Does Patient Have a Medical Advance Directive? No No No No  Would patient like information on creating a medical advance directive? Yes (MAU/Ambulatory/Procedural Areas - Information given) No - Patient declined Yes - Scientist, clinical (histocompatibility and immunogenetics) given Yes - Educational materials given    Hospital Utilization Over the Past 12 Months: # of hospitalizations or ER visits: 0 # of surgeries: 0  Review of Systems    Patient reports that her overall health is unchanged compared to last year.  Patient Reported Readings (BP, Pulse, CBG, Weight, etc) none  Review of Systems: History obtained from chart review and the patient General ROS: negative  All other systems negative.  Pain Assessment Pain : No/denies pain     Current Medications & Allergies (verified) Allergies  as of 03/10/2019      Reactions   Evista [raloxifene Hcl] Other (See Comments)   Bone pain, couldn't put weight on right leg/foot   Fosamax [alendronate Sodium]    Bone pain   Red Dye    Sulfa Antibiotics       Medication List       Accurate as of March 10, 2019  2:30 PM. If you have any questions, ask your nurse or doctor.        albuterol 108 (90 Base) MCG/ACT inhaler Commonly known as: VENTOLIN HFA Inhale 2 puffs into the lungs every 6 (six) hours as needed for wheezing.   alendronate 70 MG tablet Commonly known as: FOSAMAX Take 1 tablet (70 mg total) by mouth every 7 (seven) days. Take with a full glass of water on an empty stomach.   fluticasone 50 MCG/ACT nasal spray Commonly known as: FLONASE USE TWO SPRAY(S) IN EACH NOSTRIL ONCE DAILY   Fluticasone-Salmeterol 100-50 MCG/DOSE Aepb Commonly known as: Advair Diskus INHALE 1 DOSE BY MOUTH TWICE DAILY   Krill Oil 300 MG Caps Take 1 capsule (300 mg total) by mouth 2 (two) times daily.   levothyroxine 75 MCG tablet Commonly known as: SYNTHROID Take 1 tablet (75 mcg total) by mouth daily.   multivitamin with minerals Tabs tablet Take 1 tablet by mouth daily.   valACYclovir 1000 MG tablet Commonly known as: VALTREX Take 1 tablet (1,000 mg total) by mouth 3 (three) times daily.   Vitamin D3 75 MCG (3000 UT) Tabs Take 2,000 Units by mouth daily.       History (reviewed): Past Medical History:  Diagnosis Date  . Allergy    seasonal  . Asthma   .  Cataract   . Colon polyps   . Hyperlipidemia   . Menopausal state   . Osteopenia   . Rhinitis   . Thyroid disease   . Vitamin D deficiency disease    Past Surgical History:  Procedure Laterality Date  . CHOLECYSTECTOMY  2005  . TONSILLECTOMY     Family History  Problem Relation Age of Onset  . Hyperlipidemia Mother   . Hypertension Mother   . Thyroid disease Mother   . Osteoporosis Mother   . Heart disease Father   . Heart attack Father        heavy  smoker  . Hypertension Sister   . Gout Sister   . Hypertension Brother   . Asthma Brother    Social History   Socioeconomic History  . Marital status: Single    Spouse name: Not on file  . Number of children: 0  . Years of education: 4312  . Highest education level: 12th grade  Occupational History  . 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 Financial resource strain: Not hard at all  . Food insecurity    Worry: Never true    Inability: Never true  . Transportation needs    Medical: No    Non-medical: No  Tobacco Use  . Smoking status: Never Smoker  . Smokeless tobacco: Never Used  Substance and Sexual Activity  . Alcohol use: No  . Drug use: No  . Sexual activity: Never  Lifestyle  . Physical activity    Days per week: 0 days    Minutes per session: 0 min  . Stress: Only a little  Relationships  . Social connections    Talks on phone: More than three times a week    Gets together: More than three times a week    Attends religious service: More than 4 times per year    Active member of club or organization: Yes    Attends meetings of clubs or organizations: More than 4 times per year    Relationship status: Never married  Other Topics Concern  . Not on file  Social History Narrative   Lives at home with her mother. Never married and no children. Retired from [employer] in the [employer] department.     Activities of Daily Living In your present state of health, do you have any difficulty performing the following activities: 03/10/2019  Hearing? N  Vision? N  Difficulty concentrating or making decisions? N  Walking or climbing stairs? N  Dressing or bathing? N  Doing errands, shopping? N  Preparing Food and eating ? N  Using the Toilet? N  In the past six months, have you accidently leaked urine? N  Do you have problems with loss of bowel control? N  Managing your Medications? N   Managing your Finances? N  Housekeeping or managing your Housekeeping? N  Some recent data might be hidden    Patient Literacy How often do you need to have someone help you when you read instructions, pamphlets, or other written materials from your doctor or pharmacy?: 1 - Never What is the last grade level you completed in school?: 12th grade  Exercise Current Exercise Habits: The patient does not participate in regular exercise at present, Exercise limited by: None identified  Diet Patient reports consuming 3 meals a day and 2 snack(s) a day Patient reports that her primary diet is: Regular Patient reports that  she does have regular access to food.   Depression Screen PHQ 2/9 Scores 03/10/2019 06/25/2018 12/23/2017 10/26/2017 06/25/2017 12/23/2016 07/02/2016  PHQ - 2 Score 0 0 0 0 0 0 0     Fall Risk Fall Risk  03/10/2019 12/23/2017 10/26/2017 06/25/2017 12/23/2016  Falls in the past year? 0 No No No No  Number falls in past yr: 0 - - - -  Injury with Fall? 0 - - - -  Risk for fall due to : - - - - -     Objective:  Dyamon P Acocella seemed alert and oriented and she participated appropriately during our telephone visit.  Blood Pressure Weight BMI  BP Readings from Last 3 Encounters:  03/08/19 (!) 187/86  06/25/18 (!) 141/73  12/23/17 (!) 141/76   Wt Readings from Last 3 Encounters:  03/10/19 129 lb (58.5 kg)  03/08/19 129 lb 9.6 oz (58.8 kg)  06/25/18 146 lb (66.2 kg)   BMI Readings from Last 1 Encounters:  03/10/19 24.37 kg/m    *Unable to obtain current vital signs, weight, and BMI due to telephone visit type  Hearing/Vision  . Tyaisha did not seem to have difficulty with hearing/understanding during the telephone conversation . Reports that she has had a formal eye exam by an eye care professional within the past year . Reports that she has not had a formal hearing evaluation within the past year *Unable to fully assess hearing and vision during telephone visit type  Cognitive  Function: 6CIT Screen 03/10/2019  What Year? 0 points  What month? 0 points  What time? 0 points  Count back from 20 0 points  Months in reverse 0 points  Repeat phrase 0 points  Total Score 0   (Normal:0-7, Significant for Dysfunction: >8)  Normal Cognitive Function Screening: Yes   Immunization & Health Maintenance Record Immunization History  Administered Date(s) Administered  . Influenza, High Dose Seasonal PF 06/16/2017, 06/04/2018  . Influenza,inj,Quad PF,6+ Mos 06/10/2013, 06/05/2014, 06/05/2015, 05/26/2016  . Pneumococcal Conjugate-13 12/26/2014  . Pneumococcal Polysaccharide-23 11/22/2012  . Td 09/30/2007  . Tdap 12/23/2017    Health Maintenance  Topic Date Due  . Hepatitis C Screening  12/13/1945  . COLONOSCOPY  10/20/2012  . MAMMOGRAM  12/23/2018  . INFLUENZA VACCINE  03/26/2019  . DEXA SCAN  11/03/2019  . TETANUS/TDAP  12/24/2027  . PNA vac Low Risk Adult  Completed       Assessment  This is a routine wellness examination for [employer] Corporation[employer]thel P Feltner.  Health Maintenance: Due or Overdue Health Maintenance Due  Topic Date Due  . Hepatitis C Screening  12/13/1945  . COLONOSCOPY  10/20/2012  . MAMMOGRAM  12/23/2018    Majel HomerEthel P Ganaway does not need a referral for Community Assistance: Care Management:   no Social Work:    no Prescription Assistance:  no Nutrition/Diabetes Education:  no   Plan:  Personalized Goals Goals Addressed   None    Personalized Health Maintenance & Screening Recommendations  Screening mammography Colorectal cancer screening Advanced directives: has NO advanced directive  - add't info requested. Referral to SW: no Shingrix  Lung Cancer Screening Recommended: no (Low Dose CT Chest recommended if Age 41-80 years, 30 pack-year currently smoking OR have quit w/in past 15 years) Hepatitis C Screening recommended: yes HIV Screening recommended: no  Advanced Directives: Written information was prepared per patient's request.   Referrals & Orders No orders of the defined types were placed in this encounter.   Follow-up Plan .  Follow-up with Mechele ClaudeStacks, Warren, MD as planned   I have personally reviewed and noted the following in the patient's chart:   . Medical and social history . Use of alcohol, tobacco or illicit drugs  . Current medications and supplements . Functional ability and status . Nutritional status . Physical activity . Advanced directives . List of other physicians . Hospitalizations, surgeries, and ER visits in previous 12 months . Vitals . Screenings to include cognitive, depression, and falls . Referrals and appointments  In addition, I have reviewed and discussed with Majel HomerEthel P Dorgan certain preventive protocols, quality metrics, and best practice recommendations. A written personalized care plan for preventive services as well as general preventive health recommendations is available and can be mailed to the patient at her request.      Caryl BisChanda M Kathlyne Loud, LPN  1/[id number]/47827/16/2020     I have reviewed and agree with the above AWV documentation.   Jannifer Rodneyhristy Hawks, FNP

## 2019-03-10 NOTE — Patient Instructions (Signed)
[name] , Thank you for taking time to come for your Medicare Wellness Visit. I appreciate your ongoing commitment to your health goals. Please review the following plan we discussed and let me know if I can assist you in the future.   These are the goals we discussed: Goals    . DIET - INCREASE WATER INTAKE     6 to 8 glasses a day    . Exercise 3x per week (30 min per time)     Walk for 30 minutes at least 3 times per week.        This is a list of the screening recommended for you and due dates:  Health Maintenance  Topic Date Due  .  Hepatitis C: One time screening is recommended by Center for Disease Control  (CDC) for  adults born from [birth year] through 1967.   [birth year]  . Colon Cancer Screening  10/20/2012  . Mammogram  12/23/2018  . Flu Shot  03/26/2019  . DEXA scan (bone density measurement)  11/03/2019  . Tetanus Vaccine  12/24/2027  . Pneumonia vaccines  Completed    Advance Directive  Advance directives are legal documents that let you make choices ahead of time about your health care and medical treatment in case you become unable to communicate for yourself. Advance directives are a way for you to communicate your wishes to family, friends, and health care providers. This can help convey your decisions about end-of-life care if you become unable to communicate. Discussing and writing advance directives should happen over time rather than all at once. Advance directives can be changed depending on your situation and what you want, even after you have signed the advance directives. If you do not have an advance directive, some states assign family decision makers to act on your behalf based on how closely you are related to them. Each state has its own laws regarding advance directives. You may want to check with your health care provider, attorney, or state representative about the laws in your state. There are different types of advance directives, such as:  Medical  power of attorney.  Living will.  Do not resuscitate (DNR) or do not attempt resuscitation (DNAR) order. Health care proxy and medical power of attorney A health care proxy, also called a health care agent, is a person who is appointed to make medical decisions for you in cases in which you are unable to make the decisions yourself. Generally, people choose someone they know well and trust to represent their preferences. Make sure to ask this person for an agreement to act as your proxy. A proxy may have to exercise judgment in the event of a medical decision for which your wishes are not known. A medical power of attorney is a legal document that names your health care proxy. Depending on the laws in your state, after the document is written, it may also need to be:  Signed.  Notarized.  Dated.  Copied.  Witnessed.  Incorporated into your medical record. You may also want to appoint someone to manage your financial affairs in a situation in which you are unable to do so. This is called a durable power of attorney for finances. It is a separate legal document from the durable power of attorney for health care. You may choose the same person or someone different from your health care proxy to act as your agent in financial matters. If you do not appoint a proxy, or if there  is a concern that the proxy is not acting in your best interests, a court-appointed guardian may be designated to act on your behalf. Living will A living will is a set of instructions documenting your wishes about medical care when you cannot express them yourself. Health care providers should keep a copy of your living will in your medical record. You may want to give a copy to family members or friends. To alert caregivers in case of an emergency, you can place a card in your wallet to let them know that you have a living will and where they can find it. A living will is used if you become:  Terminally ill.   Incapacitated.  Unable to communicate or make decisions. Items to consider in your living will include:  The use or non-use of life-sustaining equipment, such as dialysis machines and breathing machines (ventilators).  A DNR or DNAR order, which is the instruction not to use cardiopulmonary resuscitation (CPR) if breathing or heartbeat stops.  The use or non-use of tube feeding.  Withholding of food and fluids.  Comfort (palliative) care when the goal becomes comfort rather than a cure.  Organ and tissue donation. A living will does not give instructions for distributing your money and property if you should pass away. It is recommended that you seek the advice of a lawyer when writing a will. Decisions about taxes, beneficiaries, and asset distribution will be legally binding. This process can relieve your family and friends of any concerns surrounding disputes or questions that may come up about the distribution of your assets. DNR or DNAR A DNR or DNAR order is a request not to have CPR in the event that your heart stops beating or you stop breathing. If a DNR or DNAR order has not been made and shared, a health care provider will try to help any patient whose heart has stopped or who has stopped breathing. If you plan to have surgery, talk with your health care provider about how your DNR or DNAR order will be followed if problems occur. Summary  Advance directives are the legal documents that allow you to make choices ahead of time about your health care and medical treatment in case you become unable to communicate for yourself.  The process of discussing and writing advance directives should happen over time. You can change the advance directives, even after you have signed them.  Advance directives include DNR or DNAR orders, living wills, and designating an agent as your medical power of attorney. This information is not intended to replace advice given to you by your health care  provider. Make sure you discuss any questions you have with your health care provider. Document Released: 11/18/2007 Document Revised: 09/15/2018 Document Reviewed: 06/30/2016 Elsevier Patient Education  2020 [employer]

## 2019-05-13 ENCOUNTER — Ambulatory Visit (INDEPENDENT_AMBULATORY_CARE_PROVIDER_SITE_OTHER): Payer: Medicare Other | Admitting: Family Medicine

## 2019-05-13 ENCOUNTER — Other Ambulatory Visit: Payer: Self-pay

## 2019-05-13 ENCOUNTER — Encounter: Payer: Self-pay | Admitting: Family Medicine

## 2019-05-13 VITALS — BP 148/76 | HR 100 | Temp 98.9°F | Resp 18 | Ht 61.0 in | Wt 122.0 lb

## 2019-05-13 DIAGNOSIS — L720 Epidermal cyst: Secondary | ICD-10-CM

## 2019-05-13 MED ORDER — DOXYCYCLINE HYCLATE 100 MG PO TABS: 100.0000 mg | ORAL_TABLET | Freq: Two times a day (BID) | ORAL | 0 refills | Status: DC

## 2019-05-13 MED ORDER — DOXYCYCLINE CALCIUM 50 MG/5ML PO SYRP: 100.0000 mg | ORAL_SOLUTION | Freq: Two times a day (BID) | ORAL | 0 refills | Status: AC

## 2019-05-13 NOTE — Patient Instructions (Signed)
Epidermal Cyst Removal, Care After This sheet gives you information about how to care for yourself after your procedure. Your health care provider may also give you more specific instructions. If you have problems or questions, contact your health care provider. What can I expect after the procedure? After the procedure, it is common to have:  Soreness in the area where your cyst was removed.  Tightness or itchiness from the stitches (sutures) in your skin. Follow these instructions at home: Medicines  Take over-the-counter and prescription medicines only as told by your health care provider.  If you were prescribed an antibiotic medicine or ointment, take or apply it as told by your health care provider. Do not stop using the antibiotic even if you start to feel better. Incision care   Follow instructions from your health care provider about how to take care of your incision. Make sure you: ? Wash your hands with soap and water before you change your bandage (dressing). If soap and water are not available, use hand sanitizer. ? Change your dressing as told by your health care provider. ? Leave sutures, skin glue, or adhesive strips in place. These skin closures may need to stay in place for 1-2 weeks or longer. If adhesive strip edges start to loosen and curl up, you may trim the loose edges. Do not remove adhesive strips completely unless your health care provider tells you to do that.  Keep the dressingdry until your health care provider says that it can be removed.  After your dressing is off, check your incision area every day for signs of infection. Check for: ? Redness, swelling, or pain. ? Fluid or blood. ? Warmth. ? Pus or a bad smell. General instructions  Do not take baths, swim, or use a hot tub until your health care provider approves. Ask your health care provider if you may take showers. You may only be allowed to take sponge baths.  Your health care provider may ask  you to avoid contact sports or activities that take a lot of effort. Do not do anything that stretches or puts pressure on your incision.  You can return to your normal diet.  Keep all follow-up visits as told by your health care provider. This is important. Contact a health care provider if:  You have a fever.  You have redness, swelling, or pain in the incision area.  You have fluid or blood coming from your incision.  You have pus or a bad smell coming from your incision.  Your incision feels warm to the touch.  Your cyst grows back. Summary  After the procedure, it is common to have soreness in the area where your cyst was removed.  Take or apply over-the-counter and prescription medicines only as told by your health care provider.  Follow instructions from your health care provider about how to take care of your incision. This information is not intended to replace advice given to you by your health care provider. Make sure you discuss any questions you have with your health care provider. Document Released: 09/01/2014 Document Revised: 12/01/2017 Document Reviewed: 06/04/2017 Elsevier Patient Education  2020 [employer] Inc.   Epidermal Cyst  An epidermal cyst is a small, painless lump under your skin. The cyst contains a grayish-white, bad-smelling substance (keratin). Do not try to pop or open an epidermal cyst yourself. What are the causes?  A blocked hair follicle.  A hair that curls and re-enters the skin instead of growing straight out of  the skin.  A blocked pore.  Irritated skin.  An injury to the skin.  Certain conditions that are passed along from parent to child (inherited).  Human papillomavirus (HPV).  Long-term sun damage to the skin. What increases the risk?  Having acne.  Being overweight.  Being 52-54 years old. What are the signs or symptoms? These cysts are usually harmless, but they can get infected. Symptoms of infection may include:   Redness.  Inflammation.  Tenderness.  Warmth.  Fever.  A grayish-white, bad-smelling substance drains from the cyst.  Pus drains from the cyst. How is this treated? In many cases, epidermal cysts go away on their own without treatment. If a cyst becomes infected, treatment may include:  Opening and draining the cyst, done by a doctor. After draining, you may need minor surgery to remove the rest of the cyst.  Antibiotic medicine.  Shots of medicines (steroids) that help to reduce inflammation.  Surgery to remove the cyst. Surgery may be done if the cyst: ? Becomes large. ? Bothers you. ? Has a chance of turning into cancer.  Do not try to open a cyst yourself. Follow these instructions at home:  Take over-the-counter and prescription medicines only as told by your doctor.  If you were prescribed an antibiotic medicine, take it it as told by your doctor. Do not stop using the antibiotic even if you start to feel better.  Keep the area around your cyst clean and dry.  Wear loose, dry clothing.  Avoid touching your cyst.  Check your cyst every day for signs of infection. Check for: ? Redness, swelling, or pain. ? Fluid or blood. ? Warmth. ? Pus or a bad smell.  Keep all follow-up visits as told by your doctor. This is important. How is this prevented?  Wear clean, dry, clothing.  Avoid wearing tight clothing.  Keep your skin clean and dry. Take showers or baths every day. Contact a doctor if:  Your cyst has symptoms of infection.  Your condition does not improve or gets worse.  You have a cyst that looks different from other cysts you have had.  You have a fever. Get help right away if:  Redness spreads from the cyst into the area close by. Summary  An epidermal cyst is a sac made of skin tissue.  If a cyst becomes infected, treatment may include surgery to open and drain the cyst, or to remove it.  Take over-the-counter and prescription  medicines only as told by your doctor.  Contact a doctor if your condition is not improving or is getting worse.  Keep all follow-up visits as told by your doctor. This is important. This information is not intended to replace advice given to you by your health care provider. Make sure you discuss any questions you have with your health care provider. Document Released: 09/18/2004 Document Revised: 12/02/2018 Document Reviewed: 05/20/2018 Elsevier Patient Education  2020 [employer].

## 2019-05-14 NOTE — Progress Notes (Signed)
Subjective:  Patient ID: [name], female    DOB: 1946/05/18, 73 y.o.   MRN: [id number]  Patient Care Team: [name], MD as PCP - General (Family Medicine) Clent Jacks, MD as Consulting Physician (Ophthalmology)   Chief Complaint:  bump on back (right shoulder area )   HPI: [name] is a 73 y.o. female presenting on 05/13/2019 for bump on back (right shoulder area )   Pt presents today for evaluation of a bump on her back. Pt states this has been there for several weeks and continues to get larger. No drainage or erythema. States it started out as a blackhead and has just continued to get larger. She has not been evaluated for this prior to today. No fever, chills, pain, weakness, or confusion.    Relevant past medical, surgical, family, and social history reviewed and updated as indicated.  Allergies and medications reviewed and updated. Date reviewed: Chart in Epic.   Past Medical History:  Diagnosis Date  . Allergy    seasonal  . Asthma   . Cataract   . Colon polyps   . Hyperlipidemia   . Menopausal state   . Osteopenia   . Rhinitis   . Thyroid disease   . Vitamin D deficiency disease     Past Surgical History:  Procedure Laterality Date  . CHOLECYSTECTOMY  2005  . TONSILLECTOMY      Social History   Socioeconomic History  . Marital status: Single    Spouse name: Not on file  . Number of children: 0  . Years of education: 47  . Highest education level: 12th grade  Occupational History  . 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 Financial resource strain: Not hard at all  . Food insecurity    Worry: Never true    Inability: Never true  . Transportation needs    Medical: No    Non-medical: No  Tobacco Use  . Smoking status: Never Smoker  . Smokeless tobacco: Never Used  Substance and Sexual Activity  . Alcohol use: No  . Drug use: No  . Sexual activity: Never   Lifestyle  . Physical activity    Days per week: 0 days    Minutes per session: 0 min  . Stress: Only a little  Relationships  . Social connections    Talks on phone: More than three times a week    Gets together: More than three times a week    Attends religious service: More than 4 times per year    Active member of club or organization: Yes    Attends meetings of clubs or organizations: More than 4 times per year    Relationship status: Never married  . Intimate partner violence    Fear of current or ex partner: No    Emotionally abused: No    Physically abused: No    Forced sexual activity: No  Other Topics Concern  . Not on file  Social History Narrative   Lives at home with her mother. Never married and no children. Retired from [employer] in the [employer].     Outpatient Encounter Medications as of 05/13/2019  Medication Sig  . albuterol (PROVENTIL HFA;VENTOLIN HFA) 108 (90 Base) MCG/ACT inhaler Inhale 2 puffs into the lungs every 6 (six) hours as needed for wheezing.  Marland Kitchen alendronate (FOSAMAX) 70 MG tablet Take 1 tablet (70 mg  total) by mouth every 7 (seven) days. Take with a full glass of water on an empty stomach.  . Cholecalciferol (VITAMIN D3) 3000 UNITS TABS Take 2,000 Units by mouth daily.   . fluticasone (FLONASE) 50 MCG/ACT nasal spray USE TWO SPRAY(S) IN EACH NOSTRIL ONCE DAILY  . Fluticasone-Salmeterol (ADVAIR DISKUS) 100-50 MCG/DOSE AEPB INHALE 1 DOSE BY MOUTH TWICE DAILY  . levothyroxine (SYNTHROID) 75 MCG tablet Take 1 tablet (75 mcg total) by mouth daily.  . Multiple Vitamin (MULTIVITAMIN WITH MINERALS) TABS Take 1 tablet by mouth daily.  Marland Kitchen doxycycline (VIBRAMYCIN) 50 MG/5ML SYRP Take 10 mLs (100 mg total) by mouth 2 (two) times daily for 10 days.  . [DISCONTINUED] doxycycline (VIBRA-TABS) 100 MG tablet Take 1 tablet (100 mg total) by mouth 2 (two) times daily for 10 days. 1 po bid  . [DISCONTINUED] Krill Oil 300 MG CAPS Take 1 capsule (300  mg total) by mouth 2 (two) times daily.  . [DISCONTINUED] valACYclovir (VALTREX) 1000 MG tablet Take 1 tablet (1,000 mg total) by mouth 3 (three) times daily.   No facility-administered encounter medications on file as of 05/13/2019.     Allergies  Allergen Reactions  . Evista [Raloxifene Hcl] Other (See Comments)    Bone pain, couldn't put weight on right leg/foot  . Fosamax [Alendronate Sodium]     Bone pain  . Red Dye   . Sulfa Antibiotics     Review of Systems  Constitutional: Negative for activity change, appetite change, chills, fatigue and fever.  HENT: Negative.   Eyes: Negative.   Respiratory: Negative for cough, chest tightness and shortness of breath.   Cardiovascular: Negative for chest pain, palpitations and leg swelling.  Gastrointestinal: Negative for blood in stool, constipation, diarrhea, nausea and vomiting.  Endocrine: Negative.   Genitourinary: Negative for dysuria, frequency and urgency.  Musculoskeletal: Negative for arthralgias and myalgias.  Skin:       Lesion to right upper back  Allergic/Immunologic: Negative.   Neurological: Negative for dizziness and headaches.  Hematological: Negative.   Psychiatric/Behavioral: Negative for confusion, hallucinations, sleep disturbance and suicidal ideas.  All other systems reviewed and are negative.       Objective:  BP (!) 148/76 (BP Location: Left Arm, Cuff Size: Normal)   Pulse 100   Temp 98.9 F (37.2 C)   Resp 18   Ht 5' 1"  (1.549 m)   Wt 122 lb (55.3 kg)   SpO2 97%   BMI 23.05 kg/m    Wt Readings from Last 3 Encounters:  05/13/19 122 lb (55.3 kg)  03/10/19 129 lb (58.5 kg)  03/08/19 129 lb 9.6 oz (58.8 kg)    Physical Exam Vitals signs and nursing note reviewed.  Constitutional:      General: She is not in acute distress.    Appearance: Normal appearance. She is well-developed and well-groomed. She is not ill-appearing, toxic-appearing or diaphoretic.  HENT:     Head: Normocephalic and  atraumatic.     Jaw: There is normal jaw occlusion.     Right Ear: Hearing normal.     Left Ear: Hearing normal.     Nose: Nose normal.     Mouth/Throat:     Lips: Pink.     Mouth: Mucous membranes are moist.     Pharynx: Oropharynx is clear. Uvula midline.  Eyes:     General: Lids are normal.     Extraocular Movements: Extraocular movements intact.     Conjunctiva/sclera: Conjunctivae normal.  Pupils: Pupils are equal, round, and reactive to light.  Neck:     Musculoskeletal: Normal range of motion and neck supple.     Thyroid: No thyroid mass, thyromegaly or thyroid tenderness.     Vascular: No carotid bruit or JVD.     Trachea: Trachea and phonation normal.  Cardiovascular:     Rate and Rhythm: Normal rate and regular rhythm.     Chest Wall: PMI is not displaced.     Pulses: Normal pulses.     Heart sounds: Normal heart sounds. No murmur. No friction rub. No gallop.   Pulmonary:     Effort: Pulmonary effort is normal. No respiratory distress.     Breath sounds: Normal breath sounds. No wheezing.  Abdominal:     General: Bowel sounds are normal. There is no distension or abdominal bruit.     Palpations: Abdomen is soft. There is no hepatomegaly or splenomegaly.     Tenderness: There is no abdominal tenderness. There is no right CVA tenderness or left CVA tenderness.     Hernia: No hernia is present.  Musculoskeletal: Normal range of motion.     Right lower leg: No edema.     Left lower leg: No edema.  Lymphadenopathy:     Cervical: No cervical adenopathy.  Skin:    General: Skin is warm and dry.     Capillary Refill: Capillary refill takes less than 2 seconds.     Coloration: Skin is not cyanotic, jaundiced or pale.     Findings: Lesion present. No abscess, erythema or rash.       Neurological:     General: No focal deficit present.     Mental Status: She is alert and oriented to person, place, and time.     Cranial Nerves: Cranial nerves are intact.     Sensory:  Sensation is intact.     Motor: Motor function is intact.     Coordination: Coordination is intact.     Gait: Gait is intact.     Deep Tendon Reflexes: Reflexes are normal and symmetric.  Psychiatric:        Attention and Perception: Attention and perception normal.        Mood and Affect: Mood and affect normal.        Speech: Speech normal.        Behavior: Behavior normal. Behavior is cooperative.        Thought Content: Thought content normal.        Cognition and Memory: Cognition and memory normal.        Judgment: Judgment normal.        Results for orders placed or performed in visit on 06/25/18  CBC with Differential/Platelet  Result Value Ref Range   WBC 4.5 3.4 - 10.8 x10E3/uL   RBC 4.60 3.77 - 5.28 x10E6/uL   Hemoglobin 13.3 11.1 - 15.9 g/dL   Hematocrit 41.0 34.0 - 46.6 %   MCV 89 79 - 97 fL   MCH 28.9 26.6 - 33.0 pg   MCHC 32.4 31.5 - 35.7 g/dL   RDW 13.1 12.3 - 15.4 %   Platelets 293 150 - 450 x10E3/uL   Neutrophils 71 Not Estab. %   Lymphs 18 Not Estab. %   Monocytes 7 Not Estab. %   Eos 3 Not Estab. %   Basos 1 Not Estab. %   Neutrophils Absolute 3.1 1.4 - 7.0 x10E3/uL   Lymphocytes Absolute 0.8 0.7 - 3.1 x10E3/uL  Monocytes Absolute 0.3 0.1 - 0.9 x10E3/uL   EOS (ABSOLUTE) 0.1 0.0 - 0.4 x10E3/uL   Basophils Absolute 0.1 0.0 - 0.2 x10E3/uL   Immature Granulocytes 0 Not Estab. %   Immature Grans (Abs) 0.0 0.0 - 0.1 x10E3/uL  CMP14+EGFR  Result Value Ref Range   Glucose 89 65 - 99 mg/dL   BUN 10 8 - 27 mg/dL   Creatinine, Ser 0.89 0.57 - 1.00 mg/dL   GFR calc non Af Amer 64 >59 mL/min/1.73   GFR calc Af Amer 74 >59 mL/min/1.73   BUN/Creatinine Ratio 11 (L) 12 - 28   Sodium 143 134 - 144 mmol/L   Potassium 4.4 3.5 - 5.2 mmol/L   Chloride 106 96 - 106 mmol/L   CO2 21 20 - 29 mmol/L   Calcium 10.4 (H) 8.7 - 10.3 mg/dL   Total Protein 6.7 6.0 - 8.5 g/dL   Albumin 4.1 3.5 - 4.8 g/dL   Globulin, Total 2.6 1.5 - 4.5 g/dL   Albumin/Globulin Ratio 1.6  1.2 - 2.2   Bilirubin Total 0.5 0.0 - 1.2 mg/dL   Alkaline Phosphatase 60 39 - 117 IU/L   AST 16 0 - 40 IU/L   ALT 12 0 - 32 IU/L  TSH  Result Value Ref Range   TSH 0.061 (L) 0.450 - 4.500 uIU/mL  T4, Free  Result Value Ref Range   Free T4 1.67 0.82 - 1.77 ng/dL     I & D  Date/Time: 05/13/2019 8:30 AM Performed by: Baruch Gouty, FNP Authorized by: Baruch Gouty, FNP   Consent:    Consent obtained:  Verbal   Consent given by:  Patient   Risks discussed:  Bleeding, incomplete drainage, pain, infection and damage to other organs   Alternatives discussed:  No treatment, delayed treatment, alternative treatment, observation and referral Universal protocol:    Procedure explained and questions answered to patient or proxy's satisfaction: yes     Relevant documents present and verified: yes     Immediately prior to procedure a time out was called: yes     Patient identity confirmed:  Verbally with patient Location:    Type:  Cyst (epidermoid)   Size:  2.5 cm   Location:  Trunk   Trunk location:  Back (right upper) Pre-procedure details:    Skin preparation:  Alcohol and Betadine Anesthesia (see MAR for exact dosages):    Anesthesia method:  Local infiltration   Local anesthetic:  Lidocaine 2% w/o epi Procedure type:    Complexity:  Complex Procedure details:    Needle aspiration: no     Incision types:  Elliptical   Incision depth:  Dermal   Scalpel blade:  11   Wound management:  Probed and deloculated, irrigated with saline, extensive cleaning and debrided (contents of cyst removed along with entire capsule)   Drainage:  Bloody   Drainage amount:  Scant   Wound treatment: closed with 2 sutures.   Packing materials:  None Post-procedure details:    Patient tolerance of procedure:  Tolerated well, no immediate complications Comments:     Wound care discussed in detail. Follow up for suture removal in 5-7 days.     Pertinent labs & imaging results that were  available during my care of the patient were reviewed by me and considered in my medical decision making.  Assessment & Plan:  Chriselda was seen today for bump on back.  Diagnoses and all orders for this visit:  Epidermoid cyst Cyst  excised in office today. Cyst contents and entire capsule removed. 2 sutures placed for closure after removal. Will place on doxycycline empirically. Wound care discussed in detail. Suture removal in 5-7 days. Report any new or worsening symptoms. Medications and wound care as discussed.  -     doxycycline (VIBRAMYCIN) 50 MG/5ML SYRP; Take 10 mLs (100 mg total) by mouth 2 (two) times daily for 10 days.     Continue all other maintenance medications.  Follow up plan: Return in about 1 week (around 05/20/2019), or if symptoms worsen or fail to improve.  Continue healthy lifestyle choices, including diet (rich in fruits, vegetables, and lean proteins, and low in salt and simple carbohydrates) and exercise (at least 30 minutes of moderate physical activity daily).  Educational handout given for epidermoid cyst, epidermoid cyst removal aftercare.   The above assessment and management plan was discussed with the patient. The patient verbalized understanding of and has agreed to the management plan. Patient is aware to call the clinic if they develop any new symptoms or if symptoms persist or worsen. Patient is aware when to return to the clinic for a follow-up visit. Patient educated on when it is appropriate to go to the emergency department.   Monia Pouch, FNP-C [facility] [phone]

## 2019-05-18 ENCOUNTER — Encounter: Payer: Self-pay | Admitting: Family Medicine

## 2019-05-18 ENCOUNTER — Ambulatory Visit (INDEPENDENT_AMBULATORY_CARE_PROVIDER_SITE_OTHER): Payer: Medicare Other | Admitting: Family Medicine

## 2019-05-18 ENCOUNTER — Other Ambulatory Visit: Payer: Self-pay

## 2019-05-18 VITALS — BP 181/77 | HR 97 | Temp 97.5°F | Resp 18 | Ht 61.0 in | Wt 122.0 lb

## 2019-05-18 DIAGNOSIS — Z4802 Encounter for removal of sutures: Secondary | ICD-10-CM

## 2019-05-18 DIAGNOSIS — R9431 Abnormal electrocardiogram [ECG] [EKG]: Secondary | ICD-10-CM | POA: Diagnosis not present

## 2019-05-18 DIAGNOSIS — E782 Mixed hyperlipidemia: Secondary | ICD-10-CM | POA: Diagnosis not present

## 2019-05-18 DIAGNOSIS — E039 Hypothyroidism, unspecified: Secondary | ICD-10-CM | POA: Diagnosis not present

## 2019-05-18 DIAGNOSIS — I10 Essential (primary) hypertension: Secondary | ICD-10-CM

## 2019-05-18 MED ORDER — LISINOPRIL 10 MG PO TABS: 10.0000 mg | ORAL_TABLET | Freq: Every day | ORAL | 3 refills | Status: DC

## 2019-05-18 NOTE — Progress Notes (Signed)
Subjective:  [name] ID: [name], female    DOB: [birth year], 73 y.o.   MRN: [id number]  [name] Care Team: [name], MD as PCP - General (Family Medicine) Clent Jacks, MD as Consulting Physician (Ophthalmology)   Chief Complaint:  Suture / Staple Removal (back )   HPI: [name] is a 73 y.o. female presenting on 05/18/2019 for Suture / Staple Removal (back )  1. Visit for suture removal  [name] presents today for suture removal. [name] had an epidermoid cyst removed on 05/13/2019. [name] denies pain, drainage, swelling, or erythema. States [name] has been cleaning and dressing daily as discussed.    2. Essential hypertension  Pts blood pressure has been elevated over last several office visits. [name] is not on antihypertensive medications, states [name] has never been on blood pressure medications. Does have a family history of hypertension. [name] states [name] has been under a lot of stress taking care of her mother. States [name] does try to watch her sodium intake and eat healthy. States this is not always the case. [name] does not exercise on a regular basis. [name] denies chest pain, fatigue, shortness of breath, leg swelling, dizziness, palpitations, or syncope. No PND or orthopnea. No exertional fatigue.   BP Readings from Last 3 Encounters:  05/18/19 (!) 181/77  05/13/19 (!) 148/76  03/08/19 (!) 187/86     3. Hypothyroidism, unspecified type  Compliant with medications - Yes Current medications - synthroid 75 mcg Adverse side effects - No Weight - has lost a few pounds over last several months.   Bowel habit changes - No Heat or cold intolerance - No Mood changes - No Changes in sleep habits - No Fatigue - No Skin, hair, or nail changes - No Tremor - No Palpitations - No Edema - No Shortness of breath - No  Lab Results  Component Value Date   TSH 0.061 (L) 06/25/2018     4. Mixed hyperlipidemia  Does not take anything for her cholesterol. Does try to watch her diet. No   Regular exercise routine.      Relevant past medical, surgical, family, and social history reviewed and updated as indicated.  Allergies and medications reviewed and updated. Date reviewed: Chart in Epic.   Past Medical History:  Diagnosis Date  . Allergy    seasonal  . Asthma   . Cataract   . Colon polyps   . Hyperlipidemia   . Menopausal state   . Osteopenia   . Rhinitis   . Thyroid disease   . Vitamin D deficiency disease     Past Surgical History:  Procedure Laterality Date  . CHOLECYSTECTOMY  2005  . TONSILLECTOMY      Social History   Socioeconomic History  . Marital status: Single    Spouse name: Not on file  . Number of children: 0  . Years of education: 16  . Highest education level: 12th grade  Occupational History  . 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 Financial resource strain: Not hard at all  . Food insecurity    Worry: Never true    Inability: Never true  . Transportation needs    Medical: No    Non-medical: No  Tobacco Use  . Smoking status: Never Smoker  . Smokeless tobacco: Never Used  Substance and Sexual Activity  . Alcohol use: No  . Drug use: No  . Sexual  activity: Never  Lifestyle  . Physical activity    Days per week: 0 days    Minutes per session: 0 min  . Stress: Only a little  Relationships  . Social connections    Talks on phone: More than three times a week    Gets together: More than three times a week    Attends religious service: More than 4 times per year    Active member of club or organization: Yes    Attends meetings of clubs or organizations: More than 4 times per year    Relationship status: Never married  . Intimate partner violence    Fear of current or ex partner: No    Emotionally abused: No    Physically abused: No    Forced sexual activity: No  Other Topics Concern  . Not on file  Social History Narrative   Lives at home with her  mother. Never married and no children. Retired from [employer] in the [employer].     Outpatient Encounter Medications as of 05/18/2019  Medication Sig  . albuterol (PROVENTIL HFA;VENTOLIN HFA) 108 (90 Base) MCG/ACT inhaler Inhale 2 puffs into the lungs every 6 (six) hours as needed for wheezing.  Marland Kitchen alendronate (FOSAMAX) 70 MG tablet Take 1 tablet (70 mg total) by mouth every 7 (seven) days. Take with a full glass of water on an empty stomach.  . Cholecalciferol (VITAMIN D3) 3000 UNITS TABS Take 2,000 Units by mouth daily.   Marland Kitchen doxycycline (VIBRAMYCIN) 50 MG/5ML SYRP Take 10 mLs (100 mg total) by mouth 2 (two) times daily for 10 days.  . fluticasone (FLONASE) 50 MCG/ACT nasal spray USE TWO SPRAY(S) IN EACH NOSTRIL ONCE DAILY  . Fluticasone-Salmeterol (ADVAIR DISKUS) 100-50 MCG/DOSE AEPB INHALE 1 DOSE BY MOUTH TWICE DAILY  . levothyroxine (SYNTHROID) 75 MCG tablet Take 1 tablet (75 mcg total) by mouth daily.  . Multiple Vitamin (MULTIVITAMIN WITH MINERALS) TABS Take 1 tablet by mouth daily.  Marland Kitchen lisinopril (ZESTRIL) 10 MG tablet Take 1 tablet (10 mg total) by mouth daily.   No facility-administered encounter medications on file as of 05/18/2019.     Allergies  Allergen Reactions  . Evista [Raloxifene Hcl] Other (See Comments)    Bone pain, couldn't put weight on right leg/foot  . Fosamax [Alendronate Sodium]     Bone pain  . Red Dye   . Sulfa Antibiotics     Review of Systems  Constitutional: Negative for activity change, appetite change, chills, diaphoresis, fatigue, fever and unexpected weight change.  HENT: Negative.   Eyes: Negative.   Respiratory: Negative for cough, chest tightness, shortness of breath and wheezing.   Cardiovascular: Negative for chest pain, palpitations and leg swelling.  Gastrointestinal: Negative for abdominal distention, abdominal pain, anal bleeding, blood in stool, constipation, diarrhea, nausea, rectal pain and vomiting.  Endocrine:  Negative.   Genitourinary: Negative for dysuria, frequency and urgency.  Musculoskeletal: Negative for arthralgias and myalgias.  Skin: Positive for wound. Negative for color change and pallor.  Allergic/Immunologic: Negative.   Neurological: Negative for dizziness, tremors, seizures, syncope, facial asymmetry, speech difficulty, weakness, light-headedness and numbness.  Hematological: Negative.   Psychiatric/Behavioral: Negative for agitation, confusion, hallucinations, sleep disturbance and suicidal ideas. The [name] is not nervous/anxious.   All other systems reviewed and are negative.       Objective:  BP (!) 181/77 (BP Location: Left Arm, Cuff Size: Normal)   Pulse 97   Temp (!) 97.5 F (36.4 C)   Resp  18   Ht _0  (1.549 m)   Wt 122 lb (55.3 kg)   SpO2 98%   BMI 23.05 kg/m    Wt Readings from Last 3 Encounters:  05/18/19 122 lb (55.3 kg)  05/13/19 122 lb (55.3 kg)  03/10/19 129 lb (58.5 kg)    Physical Exam Vitals signs and nursing note reviewed.  Constitutional:      General: [name] is not in acute distress.    Appearance: Normal appearance. [name] is well-developed and well-groomed. [name] is not ill-appearing, toxic-appearing or diaphoretic.  HENT:     Head: Normocephalic and atraumatic.     Jaw: There is normal jaw occlusion.     Right Ear: Hearing normal.     Left Ear: Hearing normal.     Nose: Nose normal.     Mouth/Throat:     Lips: Pink.     Mouth: Mucous membranes are moist.     Pharynx: Oropharynx is clear. Uvula midline.  Eyes:     General: Lids are normal.     Extraocular Movements: Extraocular movements intact.     Conjunctiva/sclera: Conjunctivae normal.     Pupils: Pupils are equal, round, and reactive to light.  Neck:     Musculoskeletal: Normal range of motion and neck supple.     Thyroid: No thyroid mass, thyromegaly or thyroid tenderness.     Vascular: No carotid bruit or JVD.     Trachea: Trachea and phonation normal.  Cardiovascular:      Rate and Rhythm: Normal rate and regular rhythm.     Chest Wall: PMI is not displaced.     Pulses: Normal pulses.     Heart sounds: Normal heart sounds. No murmur. No friction rub. No gallop.   Pulmonary:     Effort: Pulmonary effort is normal. No respiratory distress.     Breath sounds: Normal breath sounds. No wheezing.  Abdominal:     General: Bowel sounds are normal. There is no distension or abdominal bruit.     Palpations: Abdomen is soft. There is no hepatomegaly or splenomegaly.     Tenderness: There is no abdominal tenderness. There is no right CVA tenderness or left CVA tenderness.     Hernia: No hernia is present.  Musculoskeletal: Normal range of motion.     Right lower leg: No edema.     Left lower leg: No edema.  Lymphadenopathy:     Cervical: No cervical adenopathy.  Skin:    General: Skin is warm and dry.     Capillary Refill: Capillary refill takes less than 2 seconds.     Coloration: Skin is not cyanotic, jaundiced or pale.     Findings: Wound present. No rash.       Neurological:     General: No focal deficit present.     Mental Status: [name] is alert and oriented to person, place, and time.     Cranial Nerves: Cranial nerves are intact. No cranial nerve deficit.     Sensory: Sensation is intact. No sensory deficit.     Motor: Motor function is intact. No weakness.     Coordination: Coordination is intact. Coordination normal.     Gait: Gait is intact. Gait normal.     Deep Tendon Reflexes: Reflexes are normal and symmetric. Reflexes normal.  Psychiatric:        Attention and Perception: Attention and perception normal.        Mood and Affect: Mood and affect normal.  Speech: Speech normal.        Behavior: Behavior normal. Behavior is cooperative.        Thought Content: Thought content normal.        Cognition and Memory: Cognition and memory normal.        Judgment: Judgment normal.     Results for orders placed or performed in visit on 06/25/18   CBC with Differential/Platelet  Result Value Ref Range   WBC 4.5 3.4 - 10.8 x10E3/uL   RBC 4.60 3.77 - 5.28 x10E6/uL   Hemoglobin 13.3 11.1 - 15.9 g/dL   Hematocrit 41.0 34.0 - 46.6 %   MCV 89 79 - 97 fL   MCH 28.9 26.6 - 33.0 pg   MCHC 32.4 31.5 - 35.7 g/dL   RDW 13.1 12.3 - 15.4 %   Platelets 293 150 - 450 x10E3/uL   Neutrophils 71 Not Estab. %   Lymphs 18 Not Estab. %   Monocytes 7 Not Estab. %   Eos 3 Not Estab. %   Basos 1 Not Estab. %   Neutrophils Absolute 3.1 1.4 - 7.0 x10E3/uL   Lymphocytes Absolute 0.8 0.7 - 3.1 x10E3/uL   Monocytes Absolute 0.3 0.1 - 0.9 x10E3/uL   EOS (ABSOLUTE) 0.1 0.0 - 0.4 x10E3/uL   Basophils Absolute 0.1 0.0 - 0.2 x10E3/uL   Immature Granulocytes 0 Not Estab. %   Immature Grans (Abs) 0.0 0.0 - 0.1 x10E3/uL  CMP14+EGFR  Result Value Ref Range   Glucose 89 65 - 99 mg/dL   BUN 10 8 - 27 mg/dL   Creatinine, Ser 0.89 0.57 - 1.00 mg/dL   GFR calc non Af Amer 64 >59 mL/min/1.73   GFR calc Af Amer 74 >59 mL/min/1.73   BUN/Creatinine Ratio 11 (L) 12 - 28   Sodium 143 134 - 144 mmol/L   Potassium 4.4 3.5 - 5.2 mmol/L   Chloride 106 96 - 106 mmol/L   CO2 21 20 - 29 mmol/L   Calcium 10.4 (H) 8.7 - 10.3 mg/dL   Total Protein 6.7 6.0 - 8.5 g/dL   Albumin 4.1 3.5 - 4.8 g/dL   Globulin, Total 2.6 1.5 - 4.5 g/dL   Albumin/Globulin Ratio 1.6 1.2 - 2.2   Bilirubin Total 0.5 0.0 - 1.2 mg/dL   Alkaline Phosphatase 60 39 - 117 IU/L   AST 16 0 - 40 IU/L   ALT 12 0 - 32 IU/L  TSH  Result Value Ref Range   TSH 0.061 (L) 0.450 - 4.500 uIU/mL  T4, Free  Result Value Ref Range   Free T4 1.67 0.82 - 1.77 ng/dL     Suture Removal  Date/Time: 05/18/2019 11:56 AM Performed by: Baruch Gouty, FNP Authorized by: Baruch Gouty, FNP  Body area: trunk Location details: back Wound Appearance: clean, pink and warm Sutures Removed: 2 Post-removal: antibiotic ointment applied and dressing applied Facility: sutures placed in this facility [name] tolerance:  [name] tolerated the procedure well with no immediate complications    EKG: Sinus Tachycardia with LVH ST pattern. No ST elevation or ectopy. No previous EKG for comparison. Monia Pouch, FNP-C  Pertinent labs & imaging results that were available during my care of the [name] were reviewed by me and considered in my medical decision making.  Assessment & Plan:  [name] was seen today for suture / staple removal.  Diagnoses and all orders for this visit:  Visit for suture removal 2 sutures removed and wound dressed. Wound care discussed in detail.  Essential hypertension Ongoing elevated blood pressure readings without diagnosis of HTN. Labs and EKG today. Will initiate lisinopril. DASH diet and lifestyle changes discussed in detail. Return in 2 weeks for recheck BMP and BP. BP log provided and [name] to report any persistent high or low readings.  -     CMP14+EGFR -     CBC with Differential/Platelet -     Thyroid Panel With TSH -     EKG 12-Lead -     lisinopril (ZESTRIL) 10 MG tablet; Take 1 tablet (10 mg total) by mouth daily.  Hypothyroidism, unspecified type Last TSH low, will recheck today.  -     Thyroid Panel With TSH  Mixed hyperlipidemia Not on statin therapy. Will recheck lipid panel today and initiate therapy if warranted.  -     Lipid panel  Abnormal EKG       -     Ambulatory referral to cardiology  Discussed case and reviewed EKG with Dr. Livia Snellen. He agrees with referral to cardiology and above plan.   Continue all other maintenance medications.  Follow up plan: Return in about 2 weeks (around 06/01/2019), or if symptoms worsen or fail to improve, for HTN, BMP.  Continue healthy lifestyle choices, including diet (rich in fruits, vegetables, and lean proteins, and low in salt and simple carbohydrates) and exercise (at least 30 minutes of moderate physical activity daily).  Educational handout given for DASH diet, HTN, wound care  The above assessment and  management plan was discussed with the [name]. The [name] verbalized understanding of and has agreed to the management plan. [name] is aware to call the clinic if they develop any new symptoms or if symptoms persist or worsen. [name] is aware when to return to the clinic for a follow-up visit. [name] educated on when it is appropriate to go to the emergency department.   Monia Pouch, FNP-C [facility] [phone]

## 2019-05-18 NOTE — Patient Instructions (Addendum)
DASH Eating Plan DASH stands for "Dietary Approaches to Stop Hypertension." The DASH eating plan is a healthy eating plan that has been shown to reduce high blood pressure (hypertension). Additional health benefits may include reducing the risk of type 2 diabetes mellitus, heart disease, and stroke. The DASH eating plan may also help with weight loss.  WHAT DO I NEED TO KNOW ABOUT THE DASH EATING PLAN? For the DASH eating plan, you will follow these general guidelines:  Choose foods with a percent daily value for sodium of less than 5% (as listed on the food label).  Use salt-free seasonings or herbs instead of table salt or sea salt.  Check with your health care provider or pharmacist before using salt substitutes.  Eat lower-sodium products, often labeled as "lower sodium" or "no salt added."  Eat fresh foods.  Eat more vegetables, fruits, and low-fat dairy products.  Choose whole grains. Look for the word "whole" as the first word in the ingredient list.  Choose fish and skinless chicken or [place] more often than red meat. Limit fish, poultry, and meat to 6 oz (170 g) each day.  Limit sweets, desserts, sugars, and sugary drinks.  Choose heart-healthy fats.  Limit cheese to 1 oz (28 g) per day.  Eat more home-cooked food and less restaurant, buffet, and fast food.  Limit fried foods.  Cook foods using methods other than frying.  Limit canned vegetables. If you do use them, rinse them well to decrease the sodium.  When eating at a restaurant, ask that your food be prepared with less salt, or no salt if possible.  WHAT FOODS CAN I EAT? Seek help from a dietitian for individual calorie needs.  Grains Whole grain or whole wheat bread. Brown rice. Whole grain or whole wheat pasta. Quinoa, bulgur, and whole grain cereals. Low-sodium cereals. Corn or whole wheat flour tortillas. Whole grain cornbread. Whole grain crackers. Low-sodium crackers.  Vegetables Fresh or frozen  vegetables (raw, steamed, roasted, or grilled). Low-sodium or reduced-sodium tomato and vegetable juices. Low-sodium or reduced-sodium tomato sauce and paste. Low-sodium or reduced-sodium canned vegetables.   Fruits All fresh, canned (in natural juice), or frozen fruits.  Meat and Other Protein Products Ground beef (85% or leaner), grass-fed beef, or beef trimmed of fat. Skinless chicken or [place]. Ground chicken or [place]. Pork trimmed of fat. All fish and seafood. Eggs. Dried beans, peas, or lentils. Unsalted nuts and seeds. Unsalted canned beans.  Dairy Low-fat dairy products, such as skim or 1% milk, 2% or reduced-fat cheeses, low-fat ricotta or cottage cheese, or plain low-fat yogurt. Low-sodium or reduced-sodium cheeses.  Fats and Oils Tub margarines without trans fats. Light or reduced-fat mayonnaise and salad dressings (reduced sodium). Avocado. Safflower, olive, or canola oils. Natural peanut or almond butter.  Other Unsalted popcorn and pretzels. The items listed above may not be a complete list of recommended foods or beverages. Contact your dietitian for more options.  WHAT FOODS ARE NOT RECOMMENDED?  Grains White bread. White pasta. White rice. Refined cornbread. Bagels and croissants. Crackers that contain trans fat.  Vegetables Creamed or fried vegetables. Vegetables in a cheese sauce. Regular canned vegetables. Regular canned tomato sauce and paste. Regular tomato and vegetable juices.  Fruits Dried fruits. Canned fruit in light or heavy syrup. Fruit juice.  Meat and Other Protein Products Fatty cuts of meat. Ribs, chicken wings, bacon, sausage, bologna, salami, chitterlings, fatback, hot dogs, bratwurst, and packaged luncheon meats. Salted nuts and seeds. Canned beans with salt.  Dairy Whole or 2% milk, cream, half-and-half, and cream cheese. Whole-fat or sweetened yogurt. Full-fat cheeses or blue cheese. Nondairy creamers and whipped toppings. Processed cheese,  cheese spreads, or cheese curds.  Condiments Onion and garlic salt, seasoned salt, table salt, and sea salt. Canned and packaged gravies. Worcestershire sauce. Tartar sauce. Barbecue sauce. Teriyaki sauce. Soy sauce, including reduced sodium. Steak sauce. Fish sauce. Oyster sauce. Cocktail sauce. Horseradish. Ketchup and mustard. Meat flavorings and tenderizers. Bouillon cubes. Hot sauce. Tabasco sauce. Marinades. Taco seasonings. Relishes.  Fats and Oils Butter, stick margarine, lard, shortening, ghee, and bacon fat. Coconut, palm kernel, or palm oils. Regular salad dressings.  Other Pickles and olives. Salted popcorn and pretzels.  The items listed above may not be a complete list of foods and beverages to avoid. Contact your dietitian for more information.  WHERE CAN I FIND MORE INFORMATION? National [employer], [employer], [employer]: [employer] Document Released: 07/31/2011 Document Revised: 12/26/2013 Document Reviewed: 06/15/2013 [facility] Patient Information 2015 [place], [place]. This information is not intended to replace advice given to you by your health care provider. Make sure you discuss any questions you have with your health care provider.   I think that you would greatly benefit from seeing a nutritionist.  If you are interested, please call Dr Gerilyn Pilgrim at [phone] to schedule an appointment.   Hypertension, Adult Hypertension is another name for high blood pressure. High blood pressure forces your heart to work harder to pump blood. This can cause problems over time. There are two numbers in a blood pressure reading. There is a top number (systolic) over a bottom number (diastolic). It is best to have a blood pressure that is below 120/80. Healthy choices can help lower your blood pressure, or you may need medicine to help lower it. What are the causes? The cause of this condition is not known. Some conditions may be related to high  blood pressure. What increases the risk?  Smoking.  Having type 2 diabetes mellitus, high cholesterol, or both.  Not getting enough exercise or physical activity.  Being overweight.  Having too much fat, sugar, calories, or salt (sodium) in your diet.  Drinking too much alcohol.  Having long-term (chronic) kidney disease.  Having a family history of high blood pressure.  Age. Risk increases with age.  Race. You may be at higher risk if you are African American.  Gender. Men are at higher risk than women before age 42. After age 9, women are at higher risk than men.  Having obstructive sleep apnea.  Stress. What are the signs or symptoms?  High blood pressure may not cause symptoms. Very high blood pressure (hypertensive crisis) may cause: ? Headache. ? Feelings of worry or nervousness (anxiety). ? Shortness of breath. ? Nosebleed. ? A feeling of being sick to your stomach (nausea). ? Throwing up (vomiting). ? Changes in how you see. ? Very bad chest pain. ? Seizures. How is this treated?  This condition is treated by making healthy lifestyle changes, such as: ? Eating healthy foods. ? Exercising more. ? Drinking less alcohol.  Your health care provider may prescribe medicine if lifestyle changes are not enough to get your blood pressure under control, and if: ? Your top number is above 130. ? Your bottom number is above 80.  Your personal target blood pressure may vary. Follow these instructions at home: Eating and drinking   If told, follow the DASH eating plan. To follow this plan: ? Fill one half of your  plate at each meal with fruits and vegetables. ? Fill one fourth of your plate at each meal with whole grains. Whole grains include whole-wheat pasta, brown rice, and whole-grain bread. ? Eat or drink low-fat dairy products, such as skim milk or low-fat yogurt. ? Fill one fourth of your plate at each meal with low-fat (lean) proteins. Low-fat proteins  include fish, chicken without skin, eggs, beans, and tofu. ? Avoid fatty meat, cured and processed meat, or chicken with skin. ? Avoid pre-made or processed food.  Eat less than 1,500 mg of salt each day.  Do not drink alcohol if: ? Your doctor tells you not to drink. ? You are pregnant, may be pregnant, or are planning to become pregnant.  If you drink alcohol: ? Limit how much you use to:  0-1 drink a day for women.  0-2 drinks a day for men. ? Be aware of how much alcohol is in your drink. In the [place] one drink equals one 12 oz bottle of beer (355 mL), one 5 oz glass of wine (148 mL), or one 1 oz glass of hard liquor (44 mL). Lifestyle   Work with your doctor to stay at a healthy weight or to lose weight. Ask your doctor what the best weight is for you.  Get at least 30 minutes of exercise most days of the week. This may include walking, swimming, or biking.  Get at least 30 minutes of exercise that strengthens your muscles (resistance exercise) at least 3 days a week. This may include lifting weights or doing Pilates.  Do not use any products that contain nicotine or tobacco, such as cigarettes, e-cigarettes, and chewing tobacco. If you need help quitting, ask your doctor.  Check your blood pressure at home as told by your doctor.  Keep all follow-up visits as told by your doctor. This is important. Medicines  Take over-the-counter and prescription medicines only as told by your doctor. Follow directions carefully.  Do not skip doses of blood pressure medicine. The medicine does not work as well if you skip doses. Skipping doses also puts you at risk for problems.  Ask your doctor about side effects or reactions to medicines that you should watch for. Contact a doctor if you:  Think you are having a reaction to the medicine you are taking.  Have headaches that keep coming back (recurring).  Feel dizzy.  Have swelling in your ankles.  Have trouble with your  vision. Get help right away if you:  Get a very bad headache.  Start to feel mixed up (confused).  Feel weak or numb.  Feel faint.  Have very bad pain in your: ? Chest. ? Belly (abdomen).  Throw up more than once.  Have trouble breathing. Summary  Hypertension is another name for high blood pressure.  High blood pressure forces your heart to work harder to pump blood.  For most people, a normal blood pressure is less than 120/80.  Making healthy choices can help lower blood pressure. If your blood pressure does not get lower with healthy choices, you may need to take medicine. This information is not intended to replace advice given to you by your health care provider. Make sure you discuss any questions you have with your health care provider. Document Released: 01/28/2008 Document Revised: 04/21/2018 Document Reviewed: 04/21/2018 Elsevier Patient Education  2020 [employer]   Wound Care, Adult Taking care of your wound properly can help to prevent pain, infection, and scarring. It can  also help your wound to heal more quickly. How to care for your wound Wound care      Follow instructions from your health care provider about how to take care of your wound. Make sure you: ? Wash your hands with soap and water before you change the bandage (dressing). If soap and water are not available, use hand sanitizer. ? Change your dressing as told by your health care provider. ? Leave stitches (sutures), skin glue, or adhesive strips in place. These skin closures may need to stay in place for 2 weeks or longer. If adhesive strip edges start to loosen and curl up, you may trim the loose edges. Do not remove adhesive strips completely unless your health care provider tells you to do that.  Check your wound area every day for signs of infection. Check for: ? Redness, swelling, or pain. ? Fluid or blood. ? Warmth. ? Pus or a bad smell.  Ask your health care provider if you  should clean the wound with mild soap and water. Doing this may include: ? Using a clean towel to pat the wound dry after cleaning it. Do not rub or scrub the wound. ? Applying a cream or ointment. Do this only as told by your health care provider. ? Covering the incision with a clean dressing.  Ask your health care provider when you can leave the wound uncovered.  Keep the dressing dry until your health care provider says it can be removed. Do not take baths, swim, use a hot tub, or do anything that would put the wound underwater until your health care provider approves. Ask your health care provider if you can take showers. You may only be allowed to take sponge baths. Medicines   If you were prescribed an antibiotic medicine, cream, or ointment, take or use the antibiotic as told by your health care provider. Do not stop taking or using the antibiotic even if your condition improves.  Take over-the-counter and prescription medicines only as told by your health care provider. If you were prescribed pain medicine, take it 30 or more minutes before you do any wound care or as told by your health care provider. General instructions  Return to your normal activities as told by your health care provider. Ask your health care provider what activities are safe.  Do not scratch or pick at the wound.  Do not use any products that contain nicotine or tobacco, such as cigarettes and e-cigarettes. These may delay wound healing. If you need help quitting, ask your health care provider.  Keep all follow-up visits as told by your health care provider. This is important.  Eat a diet that includes protein, vitamin A, vitamin C, and other nutrient-rich foods to help the wound heal. ? Foods rich in protein include meat, dairy, beans, nuts, and other sources. ? Foods rich in vitamin A include carrots and dark green, leafy vegetables. ? Foods rich in vitamin C include citrus, tomatoes, and other fruits and  vegetables. ? Nutrient-rich foods have protein, carbohydrates, fat, vitamins, or minerals. Eat a variety of healthy foods including vegetables, fruits, and whole grains. Contact a health care provider if:  You received a tetanus shot and you have swelling, severe pain, redness, or bleeding at the injection site.  Your pain is not controlled with medicine.  You have redness, swelling, or pain around the wound.  You have fluid or blood coming from the wound.  Your wound feels warm to the touch.  You have pus or a bad smell coming from the wound.  You have a fever or chills.  You are nauseous or you vomit.  You are dizzy. Get help right away if:  You have a red streak going away from your wound.  The edges of the wound open up and separate.  Your wound is bleeding, and the bleeding does not stop with gentle pressure.  You have a rash.  You faint.  You have trouble breathing. Summary  Always wash your hands with soap and water before changing your bandage (dressing).  To help with healing, eat foods that are rich in protein, vitamin A, vitamin C, and other nutrients.  Check your wound every day for signs of infection. Contact your health care provider if you suspect that your wound is infected. This information is not intended to replace advice given to you by your health care provider. Make sure you discuss any questions you have with your health care provider. Document Released: 05/20/2008 Document Revised: 11/29/2018 Document Reviewed: 02/26/2016 Elsevier Patient Education  2020 [employer]

## 2019-05-19 LAB — CBC WITH DIFFERENTIAL/PLATELET
Basophils Absolute: 0 10*3/uL (ref 0.0–0.2)
Basos: 1 %
EOS (ABSOLUTE): 0.1 10*3/uL (ref 0.0–0.4)
Eos: 2 %
Hematocrit: 41.9 % (ref 34.0–46.6)
Hemoglobin: 13.8 g/dL (ref 11.1–15.9)
Immature Grans (Abs): 0 10*3/uL (ref 0.0–0.1)
Immature Granulocytes: 0 %
Lymphocytes Absolute: 0.8 10*3/uL (ref 0.7–3.1)
Lymphs: 16 %
MCH: 29.1 pg (ref 26.6–33.0)
MCHC: 32.9 g/dL (ref 31.5–35.7)
MCV: 88 fL (ref 79–97)
Monocytes Absolute: 0.4 10*3/uL (ref 0.1–0.9)
Monocytes: 8 %
Neutrophils Absolute: 3.7 10*3/uL (ref 1.4–7.0)
Neutrophils: 73 %
Platelets: 298 10*3/uL (ref 150–450)
RBC: 4.75 x10E6/uL (ref 3.77–5.28)
RDW: 13 % (ref 11.7–15.4)
WBC: 5.1 10*3/uL (ref 3.4–10.8)

## 2019-05-19 LAB — LIPID PANEL
Chol/HDL Ratio: 3.1 ratio (ref 0.0–4.4)
Cholesterol, Total: 176 mg/dL (ref 100–199)
HDL: 57 mg/dL (ref >39)
LDL Chol Calc (NIH): 94 mg/dL (ref 0–99)
Triglycerides: 144 mg/dL (ref 0–149)
VLDL Cholesterol Cal: 25 mg/dL (ref 5–40)

## 2019-05-19 LAB — THYROID PANEL WITH TSH
Free Thyroxine Index: 3.6 (ref 1.2–4.9)
T3 Uptake Ratio: 32 % (ref 24–39)
T4, Total: 11.2 ug/dL (ref 4.5–12.0)
TSH: 0.066 u[IU]/mL — ABNORMAL LOW (ref 0.450–4.500)

## 2019-05-19 LAB — CMP14+EGFR
ALT: 11 IU/L (ref 0–32)
AST: 15 IU/L (ref 0–40)
Albumin/Globulin Ratio: 1.4 (ref 1.2–2.2)
Albumin: 4 g/dL (ref 3.7–4.7)
Alkaline Phosphatase: 72 IU/L (ref 39–117)
BUN/Creatinine Ratio: 12 (ref 12–28)
BUN: 9 mg/dL (ref 8–27)
Bilirubin Total: 0.4 mg/dL (ref 0.0–1.2)
CO2: 25 mmol/L (ref 20–29)
Calcium: 10.1 mg/dL (ref 8.7–10.3)
Chloride: 101 mmol/L (ref 96–106)
Creatinine, Ser: 0.74 mg/dL (ref 0.57–1.00)
GFR calc Af Amer: 92 mL/min/{1.73_m2} (ref >59)
GFR calc non Af Amer: 80 mL/min/{1.73_m2} (ref >59)
Globulin, Total: 2.8 g/dL (ref 1.5–4.5)
Glucose: 91 mg/dL (ref 65–99)
Potassium: 4.3 mmol/L (ref 3.5–5.2)
Sodium: 139 mmol/L (ref 134–144)
Total Protein: 6.8 g/dL (ref 6.0–8.5)

## 2019-05-19 MED ORDER — LEVOTHYROXINE SODIUM 50 MCG PO TABS: 50.0000 ug | ORAL_TABLET | Freq: Every day | ORAL | 3 refills | Status: DC

## 2019-05-19 NOTE — Addendum Note (Signed)
Addended by: Baruch Gouty on: 05/19/2019 01:13 PM   Modules accepted: Orders

## 2019-06-02 ENCOUNTER — Other Ambulatory Visit: Payer: Self-pay

## 2019-06-03 ENCOUNTER — Ambulatory Visit (INDEPENDENT_AMBULATORY_CARE_PROVIDER_SITE_OTHER): Payer: Medicare Other | Admitting: Family Medicine

## 2019-06-03 ENCOUNTER — Encounter: Payer: Self-pay | Admitting: Family Medicine

## 2019-06-03 VITALS — BP 168/86 | HR 96 | Temp 97.7°F | Resp 20 | Ht 61.0 in | Wt 119.0 lb

## 2019-06-03 DIAGNOSIS — Z23 Encounter for immunization: Secondary | ICD-10-CM

## 2019-06-03 DIAGNOSIS — E875 Hyperkalemia: Secondary | ICD-10-CM

## 2019-06-03 DIAGNOSIS — I10 Essential (primary) hypertension: Secondary | ICD-10-CM | POA: Diagnosis not present

## 2019-06-03 MED ORDER — CHLORTHALIDONE 25 MG PO TABS: 25.0000 mg | ORAL_TABLET | Freq: Every day | ORAL | 3 refills | Status: DC

## 2019-06-03 NOTE — Progress Notes (Signed)
Subjective:  Patient ID: [name], female    DOB: Sep 25, 1945, 73 y.o.   MRN: [id number]  Patient Care Team: [name], MD as PCP - General (Family Medicine) Clent Jacks, MD as Consulting Physician (Ophthalmology)   Chief Complaint:  Hypertension (2 week follow up)   HPI: [name] is a 73 y.o. female presenting on 06/03/2019 for Hypertension (2 week follow up)   1. Essential hypertension Complaint with meds - Yes Current Medications - lisinopril 10 mg daily Checking BP at home ranging 160/70 Exercising Regularly - No Watching Salt intake - Yes Pertinent ROS:  Headache - No Fatigue - No Visual Disturbances - No Chest pain - No Dyspnea - No Palpitations - No LE edema - No They report good compliance with medications and can restate their regimen by memory. No medication side effects.  Family, social, and smoking history reviewed.   BP Readings from Last 3 Encounters:  06/03/19 (!) 168/86  05/18/19 (!) 181/77  05/13/19 (!) 148/76   CMP Latest Ref Rng & Units 05/18/2019 06/25/2018 12/23/2017  Glucose 65 - 99 mg/dL 91 89 90  BUN 8 - 27 mg/dL _0 Creatinine 0.57 - 1.00 mg/dL 0.74 0.89 0.90  Sodium 134 - 144 mmol/L 139 143 143  Potassium 3.5 - 5.2 mmol/L 4.3 4.4 5.0  Chloride 96 - 106 mmol/L 101 106 105  CO2 20 - 29 mmol/L _1 Calcium 8.7 - 10.3 mg/dL 10.1 10.4(H) 10.5(H)  Total Protein 6.0 - 8.5 g/dL 6.8 6.7 7.0  Total Bilirubin 0.0 - 1.2 mg/dL 0.4 0.5 0.6  Alkaline Phos 39 - 117 IU/L 72 60 62  AST 0 - 40 IU/L _2 ALT 0 - 32 IU/L _3 Relevant past medical, surgical, family, and social history reviewed and updated as indicated.  Allergies and medications reviewed and updated. Date reviewed: Chart in Epic.   Past Medical History:  Diagnosis Date  . Allergy    seasonal  . Asthma   . Cataract   . Colon polyps   . Hyperlipidemia   . Menopausal state   . Osteopenia   . Rhinitis   . Thyroid disease   . Vitamin D  deficiency disease     Past Surgical History:  Procedure Laterality Date  . CHOLECYSTECTOMY  2005  . TONSILLECTOMY      Social History   Socioeconomic History  . Marital status: Single    Spouse name: Not on file  . Number of children: 0  . Years of education: 64  . Highest education level: 12th grade  Occupational History  . 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 Financial resource strain: Not hard at all  . Food insecurity    Worry: Never true    Inability: Never true  . Transportation needs    Medical: No    Non-medical: No  Tobacco Use  . Smoking status: Never Smoker  . Smokeless tobacco: Never Used  Substance and Sexual Activity  . Alcohol use: No  . Drug use: No  . Sexual activity: Never  Lifestyle  . Physical activity    Days per week: 0 days    Minutes per session: 0 min  . Stress: Only a little  Relationships  . Social connections    Talks on phone: More than three times a week  Gets together: More than three times a week    Attends religious service: More than 4 times per year    Active member of club or organization: Yes    Attends meetings of clubs or organizations: More than 4 times per year    Relationship status: Never married  . Intimate partner violence    Fear of current or ex partner: No    Emotionally abused: No    Physically abused: No    Forced sexual activity: No  Other Topics Concern  . Not on file  Social History Narrative   Lives at home with her mother. Never married and no children. Retired from [employer] in the [employer].     Outpatient Encounter Medications as of 06/03/2019  Medication Sig  . albuterol (PROVENTIL HFA;VENTOLIN HFA) 108 (90 Base) MCG/ACT inhaler Inhale 2 puffs into the lungs every 6 (six) hours as needed for wheezing.  Marland Kitchen alendronate (FOSAMAX) 70 MG tablet Take 1 tablet (70 mg total) by mouth every 7 (seven) days. Take with  a full glass of water on an empty stomach.  . Cholecalciferol (VITAMIN D3) 3000 UNITS TABS Take 2,000 Units by mouth daily.   . fluticasone (FLONASE) 50 MCG/ACT nasal spray USE TWO SPRAY(S) IN EACH NOSTRIL ONCE DAILY  . Fluticasone-Salmeterol (ADVAIR DISKUS) 100-50 MCG/DOSE AEPB INHALE 1 DOSE BY MOUTH TWICE DAILY  . levothyroxine (SYNTHROID) 50 MCG tablet Take 1 tablet (50 mcg total) by mouth daily.  Marland Kitchen lisinopril (ZESTRIL) 10 MG tablet Take 1 tablet (10 mg total) by mouth daily.  . Multiple Vitamin (MULTIVITAMIN WITH MINERALS) TABS Take 1 tablet by mouth daily.  . chlorthalidone (HYGROTON) 25 MG tablet Take 1 tablet (25 mg total) by mouth daily.   No facility-administered encounter medications on file as of 06/03/2019.     Allergies  Allergen Reactions  . Evista [Raloxifene Hcl] Other (See Comments)    Bone pain, couldn't put weight on right leg/foot  . Fosamax [Alendronate Sodium]     Bone pain  . Red Dye   . Sulfa Antibiotics     Review of Systems  Constitutional: Negative for activity change, appetite change, chills, diaphoresis, fatigue, fever and unexpected weight change.  HENT: Negative.   Eyes: Negative.  Negative for photophobia and visual disturbance.  Respiratory: Negative for cough, chest tightness and shortness of breath.   Cardiovascular: Negative for chest pain, palpitations and leg swelling.  Gastrointestinal: Negative for abdominal pain, blood in stool, constipation, diarrhea, nausea and vomiting.  Endocrine: Negative.  Negative for polydipsia, polyphagia and polyuria.  Genitourinary: Negative for decreased urine volume, difficulty urinating, dysuria, frequency and urgency.  Musculoskeletal: Negative for arthralgias and myalgias.  Skin: Negative.   Allergic/Immunologic: Negative.   Neurological: Negative for dizziness, tremors, seizures, syncope, facial asymmetry, speech difficulty, weakness, light-headedness, numbness and headaches.  Hematological: Negative.    Psychiatric/Behavioral: Negative for confusion, hallucinations, sleep disturbance and suicidal ideas.  All other systems reviewed and are negative.       Objective:  BP (!) 168/86 (BP Location: Right Arm, Cuff Size: Normal)   Pulse 96   Temp 97.7 F (36.5 C)   Resp 20   Ht _0  (1.549 m)   Wt 119 lb (54 kg)   SpO2 98%   BMI 22.48 kg/m    Wt Readings from Last 3 Encounters:  06/03/19 119 lb (54 kg)  05/18/19 122 lb (55.3 kg)  05/13/19 122 lb (55.3 kg)    Physical Exam Vitals signs and nursing note  reviewed.  Constitutional:      General: She is not in acute distress.    Appearance: Normal appearance. She is well-developed and well-groomed. She is not ill-appearing, toxic-appearing or diaphoretic.  HENT:     Head: Normocephalic and atraumatic.     Jaw: There is normal jaw occlusion.     Right Ear: Hearing normal.     Left Ear: Hearing normal.     Nose: Nose normal.     Mouth/Throat:     Lips: Pink.     Mouth: Mucous membranes are moist.     Pharynx: Oropharynx is clear. Uvula midline.  Eyes:     General: Lids are normal.     Extraocular Movements: Extraocular movements intact.     Conjunctiva/sclera: Conjunctivae normal.     Pupils: Pupils are equal, round, and reactive to light.  Neck:     Musculoskeletal: Normal range of motion and neck supple.     Thyroid: No thyroid mass, thyromegaly or thyroid tenderness.     Vascular: No carotid bruit or JVD.     Trachea: Trachea and phonation normal.  Cardiovascular:     Rate and Rhythm: Normal rate and regular rhythm.     Chest Wall: PMI is not displaced.     Pulses: Normal pulses.     Heart sounds: Normal heart sounds. No murmur. No friction rub. No gallop.   Pulmonary:     Effort: Pulmonary effort is normal. No respiratory distress.     Breath sounds: Normal breath sounds. No wheezing.  Abdominal:     General: Bowel sounds are normal. There is no distension or abdominal bruit.     Palpations: Abdomen is soft. There  is no hepatomegaly or splenomegaly.     Tenderness: There is no abdominal tenderness. There is no right CVA tenderness or left CVA tenderness.     Hernia: No hernia is present.  Musculoskeletal: Normal range of motion.     Right lower leg: No edema.     Left lower leg: No edema.  Lymphadenopathy:     Cervical: No cervical adenopathy.  Skin:    General: Skin is warm and dry.     Capillary Refill: Capillary refill takes less than 2 seconds.     Coloration: Skin is not cyanotic, jaundiced or pale.     Findings: No rash.  Neurological:     General: No focal deficit present.     Mental Status: She is alert and oriented to person, place, and time.     Cranial Nerves: Cranial nerves are intact. No cranial nerve deficit.     Sensory: Sensation is intact. No sensory deficit.     Motor: Motor function is intact. No weakness.     Coordination: Coordination is intact. Coordination normal.     Gait: Gait is intact. Gait normal.     Deep Tendon Reflexes: Reflexes are normal and symmetric. Reflexes normal.  Psychiatric:        Attention and Perception: Attention and perception normal.        Mood and Affect: Mood and affect normal.        Speech: Speech normal.        Behavior: Behavior normal. Behavior is cooperative.        Thought Content: Thought content normal.        Cognition and Memory: Cognition and memory normal.        Judgment: Judgment normal.     Results for orders placed or performed in visit  on 05/18/19  CMP14+EGFR  Result Value Ref Range   Glucose 91 65 - 99 mg/dL   BUN 9 8 - 27 mg/dL   Creatinine, Ser 0.74 0.57 - 1.00 mg/dL   GFR calc non Af Amer 80 >59 mL/min/1.73   GFR calc Af Amer 92 >59 mL/min/1.73   BUN/Creatinine Ratio 12 12 - 28   Sodium 139 134 - 144 mmol/L   Potassium 4.3 3.5 - 5.2 mmol/L   Chloride 101 96 - 106 mmol/L   CO2 25 20 - 29 mmol/L   Calcium 10.1 8.7 - 10.3 mg/dL   Total Protein 6.8 6.0 - 8.5 g/dL   Albumin 4.0 3.7 - 4.7 g/dL   Globulin, Total  2.8 1.5 - 4.5 g/dL   Albumin/Globulin Ratio 1.4 1.2 - 2.2   Bilirubin Total 0.4 0.0 - 1.2 mg/dL   Alkaline Phosphatase 72 39 - 117 IU/L   AST 15 0 - 40 IU/L   ALT 11 0 - 32 IU/L  CBC with Differential/Platelet  Result Value Ref Range   WBC 5.1 3.4 - 10.8 x10E3/uL   RBC 4.75 3.77 - 5.28 x10E6/uL   Hemoglobin 13.8 11.1 - 15.9 g/dL   Hematocrit 41.9 34.0 - 46.6 %   MCV 88 79 - 97 fL   MCH 29.1 26.6 - 33.0 pg   MCHC 32.9 31.5 - 35.7 g/dL   RDW 13.0 11.7 - 15.4 %   Platelets 298 150 - 450 x10E3/uL   Neutrophils 73 Not Estab. %   Lymphs 16 Not Estab. %   Monocytes 8 Not Estab. %   Eos 2 Not Estab. %   Basos 1 Not Estab. %   Neutrophils Absolute 3.7 1.4 - 7.0 x10E3/uL   Lymphocytes Absolute 0.8 0.7 - 3.1 x10E3/uL   Monocytes Absolute 0.4 0.1 - 0.9 x10E3/uL   EOS (ABSOLUTE) 0.1 0.0 - 0.4 x10E3/uL   Basophils Absolute 0.0 0.0 - 0.2 x10E3/uL   Immature Granulocytes 0 Not Estab. %   Immature Grans (Abs) 0.0 0.0 - 0.1 x10E3/uL  Thyroid Panel With TSH  Result Value Ref Range   TSH 0.066 (L) 0.450 - 4.500 uIU/mL   T4, Total 11.2 4.5 - 12.0 ug/dL   T3 Uptake Ratio 32 24 - 39 %   Free Thyroxine Index 3.6 1.2 - 4.9  Lipid panel  Result Value Ref Range   Cholesterol, Total 176 100 - 199 mg/dL   Triglycerides 144 0 - 149 mg/dL   HDL 57 >39 mg/dL   VLDL Cholesterol Cal 25 5 - 40 mg/dL   LDL Chol Calc (NIH) 94 0 - 99 mg/dL   Chol/HDL Ratio 3.1 0.0 - 4.4 ratio       Pertinent labs & imaging results that were available during my care of the patient were reviewed by me and considered in my medical decision making.  Assessment & Plan:  [name] was seen today for hypertension and rash.  Diagnoses and all orders for this visit:  Essential hypertension BP poorly controlled. Changes were made in regimen today. Added chlorthalidone. Pt to continue blood pressure checks at home. Goal BP is 130/80. Pt aware to report any persistent high or low readings. DASH diet and exercise encouraged. Exercise  at least 150 minutes per week and increase as tolerated. Goal BMI > 25. Stress management encouraged. Avoid nicotine and tobacco product use. Avoid excessive alcohol and NSAID's. Avoid more than 2000 mg of sodium daily. Medications as prescribed. Follow up as scheduled. Repeat BMP in 2 weeks.  -  BMP8+EGFR -     chlorthalidone (HYGROTON) 25 MG tablet; Take 1 tablet (25 mg total) by mouth daily.  Need for immunization against influenza -     Flu Vaccine QUAD High Dose(Fluad)     Continue all other maintenance medications.  Follow up plan: Return in about 2 weeks (around 06/17/2019), or if symptoms worsen or fail to improve, for HTN, BMP.  Continue healthy lifestyle choices, including diet (rich in fruits, vegetables, and lean proteins, and low in salt and simple carbohydrates) and exercise (at least 30 minutes of moderate physical activity daily).  Educational handout given for DASH diet  The above assessment and management plan was discussed with the patient. The patient verbalized understanding of and has agreed to the management plan. Patient is aware to call the clinic if they develop any new symptoms or if symptoms persist or worsen. Patient is aware when to return to the clinic for a follow-up visit. Patient educated on when it is appropriate to go to the emergency department.   Monia Pouch, FNP-C [facility] [phone]

## 2019-06-03 NOTE — Patient Instructions (Signed)
DASH Eating Plan DASH stands for "Dietary Approaches to Stop Hypertension." The DASH eating plan is a healthy eating plan that has been shown to reduce high blood pressure (hypertension). Additional health benefits may include reducing the risk of type 2 diabetes mellitus, heart disease, and stroke. The DASH eating plan may also help with weight loss.  WHAT DO I NEED TO KNOW ABOUT THE DASH EATING PLAN? For the DASH eating plan, you will follow these general guidelines:  Choose foods with a percent daily value for sodium of less than 5% (as listed on the food label).  Use salt-free seasonings or herbs instead of table salt or sea salt.  Check with your health care provider or pharmacist before using salt substitutes.  Eat lower-sodium products, often labeled as "lower sodium" or "no salt added."  Eat fresh foods.  Eat more vegetables, fruits, and low-fat dairy products.  Choose whole grains. Look for the word "whole" as the first word in the ingredient list.  Choose fish and skinless chicken or turkey more often than red meat. Limit fish, poultry, and meat to 6 oz (170 g) each day.  Limit sweets, desserts, sugars, and sugary drinks.  Choose heart-healthy fats.  Limit cheese to 1 oz (28 g) per day.  Eat more home-cooked food and less restaurant, buffet, and fast food.  Limit fried foods.  Cook foods using methods other than frying.  Limit canned vegetables. If you do use them, rinse them well to decrease the sodium.  When eating at a restaurant, ask that your food be prepared with less salt, or no salt if possible.  WHAT FOODS CAN I EAT? Seek help from a dietitian for individual calorie needs.  Grains Whole grain or whole wheat bread. Brown rice. Whole grain or whole wheat pasta. Quinoa, bulgur, and whole grain cereals. Low-sodium cereals. Corn or whole wheat flour tortillas. Whole grain cornbread. Whole grain crackers. Low-sodium crackers.  Vegetables Fresh or frozen  vegetables (raw, steamed, roasted, or grilled). Low-sodium or reduced-sodium tomato and vegetable juices. Low-sodium or reduced-sodium tomato sauce and paste. Low-sodium or reduced-sodium canned vegetables.   Fruits All fresh, canned (in natural juice), or frozen fruits.  Meat and Other Protein Products Ground beef (85% or leaner), grass-fed beef, or beef trimmed of fat. Skinless chicken or turkey. Ground chicken or turkey. Pork trimmed of fat. All fish and seafood. Eggs. Dried beans, peas, or lentils. Unsalted nuts and seeds. Unsalted canned beans.  Dairy Low-fat dairy products, such as skim or 1% milk, 2% or reduced-fat cheeses, low-fat ricotta or cottage cheese, or plain low-fat yogurt. Low-sodium or reduced-sodium cheeses.  Fats and Oils Tub margarines without trans fats. Light or reduced-fat mayonnaise and salad dressings (reduced sodium). Avocado. Safflower, olive, or canola oils. Natural peanut or almond butter.  Other Unsalted popcorn and pretzels. The items listed above may not be a complete list of recommended foods or beverages. Contact your dietitian for more options.  WHAT FOODS ARE NOT RECOMMENDED?  Grains White bread. White pasta. White rice. Refined cornbread. Bagels and croissants. Crackers that contain trans fat.  Vegetables Creamed or fried vegetables. Vegetables in a cheese sauce. Regular canned vegetables. Regular canned tomato sauce and paste. Regular tomato and vegetable juices.  Fruits Dried fruits. Canned fruit in light or heavy syrup. Fruit juice.  Meat and Other Protein Products Fatty cuts of meat. Ribs, chicken wings, bacon, sausage, bologna, salami, chitterlings, fatback, hot dogs, bratwurst, and packaged luncheon meats. Salted nuts and seeds. Canned beans with salt.    Dairy Whole or 2% milk, cream, half-and-half, and cream cheese. Whole-fat or sweetened yogurt. Full-fat cheeses or blue cheese. Nondairy creamers and whipped toppings. Processed cheese,  cheese spreads, or cheese curds.  Condiments Onion and garlic salt, seasoned salt, table salt, and sea salt. Canned and packaged gravies. Worcestershire sauce. Tartar sauce. Barbecue sauce. Teriyaki sauce. Soy sauce, including reduced sodium. Steak sauce. Fish sauce. Oyster sauce. Cocktail sauce. Horseradish. Ketchup and mustard. Meat flavorings and tenderizers. Bouillon cubes. Hot sauce. Tabasco sauce. Marinades. Taco seasonings. Relishes.  Fats and Oils Butter, stick margarine, lard, shortening, ghee, and bacon fat. Coconut, palm kernel, or palm oils. Regular salad dressings.  Other Pickles and olives. Salted popcorn and pretzels.  The items listed above may not be a complete list of foods and beverages to avoid. Contact your dietitian for more information.  WHERE CAN I FIND MORE INFORMATION? National Heart, Lung, and Blood Institute: www.nhlbi.nih.gov/health/health-topics/topics/dash/ Document Released: 07/31/2011 Document Revised: 12/26/2013 Document Reviewed: 06/15/2013 ExitCare Patient Information 2015 [employer]. This information is not intended to replace advice given to you by your health care provider. Make sure you discuss any questions you have with your health care provider.   I think that you would greatly benefit from seeing a nutritionist.  If you are interested, please call Dr Sykes at [phone] to schedule an appointment.   

## 2019-06-04 LAB — BMP8+EGFR
BUN/Creatinine Ratio: 10 — ABNORMAL LOW (ref 12–28)
BUN: 8 mg/dL (ref 8–27)
CO2: 26 mmol/L (ref 20–29)
Calcium: 10.5 mg/dL — ABNORMAL HIGH (ref 8.7–10.3)
Chloride: 103 mmol/L (ref 96–106)
Creatinine, Ser: 0.83 mg/dL (ref 0.57–1.00)
GFR calc Af Amer: 80 mL/min/{1.73_m2} (ref >59)
GFR calc non Af Amer: 69 mL/min/{1.73_m2} (ref >59)
Glucose: 95 mg/dL (ref 65–99)
Potassium: 5.5 mmol/L — ABNORMAL HIGH (ref 3.5–5.2)
Sodium: 140 mmol/L (ref 134–144)

## 2019-06-06 NOTE — Addendum Note (Signed)
Addended by: Baruch Gouty on: 06/06/2019 11:12 AM   Modules accepted: Orders

## 2019-06-09 ENCOUNTER — Other Ambulatory Visit: Payer: Medicare Other

## 2019-06-09 ENCOUNTER — Other Ambulatory Visit: Payer: Self-pay

## 2019-06-09 DIAGNOSIS — I10 Essential (primary) hypertension: Secondary | ICD-10-CM

## 2019-06-09 DIAGNOSIS — E875 Hyperkalemia: Secondary | ICD-10-CM | POA: Diagnosis not present

## 2019-06-09 LAB — BMP8+EGFR
BUN/Creatinine Ratio: 14 (ref 12–28)
BUN: 13 mg/dL (ref 8–27)
CO2: 26 mmol/L (ref 20–29)
Calcium: 10.1 mg/dL (ref 8.7–10.3)
Chloride: 94 mmol/L — ABNORMAL LOW (ref 96–106)
Creatinine, Ser: 0.92 mg/dL (ref 0.57–1.00)
GFR calc Af Amer: 70 mL/min/{1.73_m2} (ref >59)
GFR calc non Af Amer: 61 mL/min/{1.73_m2} (ref >59)
Glucose: 84 mg/dL (ref 65–99)
Potassium: 4.1 mmol/L (ref 3.5–5.2)
Sodium: 133 mmol/L — ABNORMAL LOW (ref 134–144)

## 2019-06-14 ENCOUNTER — Ambulatory Visit (INDEPENDENT_AMBULATORY_CARE_PROVIDER_SITE_OTHER): Payer: Medicare Other | Admitting: Cardiovascular Disease

## 2019-06-14 ENCOUNTER — Other Ambulatory Visit: Payer: Self-pay

## 2019-06-14 ENCOUNTER — Encounter: Payer: Self-pay | Admitting: Cardiovascular Disease

## 2019-06-14 VITALS — BP 180/79 | HR 111 | Ht 61.0 in | Wt 120.0 lb

## 2019-06-14 DIAGNOSIS — I10 Essential (primary) hypertension: Secondary | ICD-10-CM

## 2019-06-14 DIAGNOSIS — R9431 Abnormal electrocardiogram [ECG] [EKG]: Secondary | ICD-10-CM | POA: Diagnosis not present

## 2019-06-14 NOTE — Addendum Note (Signed)
Addended by: Laurine Blazer on: 06/14/2019 09:35 AM   Modules accepted: Orders

## 2019-06-14 NOTE — Patient Instructions (Signed)
Medication Instructions:  Continue all current medications.  Labwork: none  Testing/Procedures: Your physician has requested that you have an echocardiogram. Echocardiography is a painless test that uses sound waves to create images of your heart. It provides your doctor with information about the size and shape of your heart and how well your heart's chambers and valves are working. This procedure takes approximately one hour. There are no restrictions for this procedure. Office will contact with results via phone or letter.     Follow-Up: 4 months   Any Other Special Instructions Will Be Listed Below (If Applicable).   If you need a refill on your cardiac medications before your next appointment, please call your pharmacy.  

## 2019-06-14 NOTE — Progress Notes (Signed)
CARDIOLOGY CONSULT NOTE  Patient ID: [name] MRN: [id number] DOB/AGE: 09/08/1945 73 y.o.  Admit date: (Not on file) Primary Physician: [name], MD  Reason for Consultation: Abnormal EKG  HPI: STEELE [name] is a 73 y.o. female who is being seen today for the evaluation of abnormal EKG at the request of [name], MD.   I personally viewed EKG performed on 05/18/2019 which demonstrated sinus tachycardia, 101 bpm, with probable LVH and repolarization abnormalities.  There were nonspecific ST segment abnormalities.  I personally reviewed the ECG performed today which demonstrated sinus tachycardia, 110 bpm, with probable LVH and repolarization abnormalities.  There were nonspecific ST segment abnormalities.  Past medical history includes hypertension.  I reviewed labs dated 06/09/2019 which demonstrated normal renal function.  Lipids were normal on 05/18/2019.  TSH was low, 0.066.  The patient denies any symptoms of chest pain, palpitations, shortness of breath, lightheadedness, dizziness, leg swelling, orthopnea, PND, and syncope.  She had been taking care of her mother for several years which caused a lot of stress for her.  Her mother passed away at the age of 42 and September.  She drinks 1 cup of coffee in the morning and a glass of Pepsi in the afternoon.  She does not add salt to her foods.  She uses frozen foods and occasionally canned foods.     Allergies  Allergen Reactions  . Evista [Raloxifene Hcl] Other (See Comments)    Bone pain, couldn't put weight on right leg/foot  . Fosamax [Alendronate Sodium]     Bone pain  . Red Dye   . Sulfa Antibiotics     Current Outpatient Medications  Medication Sig Dispense Refill  . albuterol (PROVENTIL HFA;VENTOLIN HFA) 108 (90 Base) MCG/ACT inhaler Inhale 2 puffs into the lungs every 6 (six) hours as needed for wheezing. 1 Inhaler 11  . alendronate (FOSAMAX) 70 MG tablet Take 1 tablet (70 mg total)  by mouth every 7 (seven) days. Take with a full glass of water on an empty stomach. 12 tablet 1  . chlorthalidone (HYGROTON) 25 MG tablet Take 1 tablet (25 mg total) by mouth daily. (Patient taking differently: Take 12.5 mg by mouth daily. ) 30 tablet 3  . Cholecalciferol (VITAMIN D3) 3000 UNITS TABS Take 2,000 Units by mouth daily.     . fluticasone (FLONASE) 50 MCG/ACT nasal spray USE TWO SPRAY(S) IN EACH NOSTRIL ONCE DAILY 48 g 1  . Fluticasone-Salmeterol (ADVAIR DISKUS) 100-50 MCG/DOSE AEPB INHALE 1 DOSE BY MOUTH TWICE DAILY 180 each 1  . levothyroxine (SYNTHROID) 50 MCG tablet Take 1 tablet (50 mcg total) by mouth daily. 90 tablet 3  . lisinopril (ZESTRIL) 10 MG tablet Take 1 tablet (10 mg total) by mouth daily. 90 tablet 3  . Multiple Vitamin (MULTIVITAMIN WITH MINERALS) TABS Take 1 tablet by mouth daily.     No current facility-administered medications for this visit.     Past Medical History:  Diagnosis Date  . Allergy    seasonal  . Asthma   . Cataract   . Colon polyps   . Hyperlipidemia   . Menopausal state   . Osteopenia   . Rhinitis   . Thyroid disease   . Vitamin D deficiency disease     Past Surgical History:  Procedure Laterality Date  . CHOLECYSTECTOMY  2005  . TONSILLECTOMY      Social History   Socioeconomic History  . Marital status: Single  Spouse name: Not on file  . Number of children: 0  . Years of education: 37  . Highest education level: 12th grade  Occupational History  . 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 Financial resource strain: Not hard at all  . Food insecurity    Worry: Never true    Inability: Never true  . Transportation needs    Medical: No    Non-medical: No  Tobacco Use  . Smoking status: Never Smoker  . Smokeless tobacco: Never Used  Substance and Sexual Activity  . Alcohol use: No  . Drug use: No  . Sexual activity: Never  Lifestyle  . Physical  activity    Days per week: 0 days    Minutes per session: 0 min  . Stress: Only a little  Relationships  . Social connections    Talks on phone: More than three times a week    Gets together: More than three times a week    Attends religious service: More than 4 times per year    Active member of club or organization: Yes    Attends meetings of clubs or organizations: More than 4 times per year    Relationship status: Never married  . Intimate partner violence    Fear of current or ex partner: No    Emotionally abused: No    Physically abused: No    Forced sexual activity: No  Other Topics Concern  . Not on file  Social History Narrative   Lives at home with her mother. Never married and no children. Retired from [employer] in the [employer].      No family history of premature CAD in 1st degree relatives.  Current Meds  Medication Sig  . albuterol (PROVENTIL HFA;VENTOLIN HFA) 108 (90 Base) MCG/ACT inhaler Inhale 2 puffs into the lungs every 6 (six) hours as needed for wheezing.  Marland Kitchen alendronate (FOSAMAX) 70 MG tablet Take 1 tablet (70 mg total) by mouth every 7 (seven) days. Take with a full glass of water on an empty stomach.  . chlorthalidone (HYGROTON) 25 MG tablet Take 1 tablet (25 mg total) by mouth daily. (Patient taking differently: Take 12.5 mg by mouth daily. )  . Cholecalciferol (VITAMIN D3) 3000 UNITS TABS Take 2,000 Units by mouth daily.   . fluticasone (FLONASE) 50 MCG/ACT nasal spray USE TWO SPRAY(S) IN EACH NOSTRIL ONCE DAILY  . Fluticasone-Salmeterol (ADVAIR DISKUS) 100-50 MCG/DOSE AEPB INHALE 1 DOSE BY MOUTH TWICE DAILY  . levothyroxine (SYNTHROID) 50 MCG tablet Take 1 tablet (50 mcg total) by mouth daily.  Marland Kitchen lisinopril (ZESTRIL) 10 MG tablet Take 1 tablet (10 mg total) by mouth daily.  . Multiple Vitamin (MULTIVITAMIN WITH MINERALS) TABS Take 1 tablet by mouth daily.      Review of systems complete and found to be negative unless listed  above in HPI    Physical exam Blood pressure (!) 180/79, pulse (!) 111, height 5\' 1"  (1.549 m), weight 120 lb (54.4 kg), SpO2 99 %. General: NAD Neck: No JVD, no thyromegaly or thyroid nodule.  Lungs: Clear to auscultation bilaterally with normal respiratory effort. CV: Nondisplaced PMI. Regular rate and rhythm, normal S1/S2, no S3/S4, no murmur.  No peripheral edema.  No carotid bruit.    Abdomen: Soft, nontender, no distention.  Skin: Intact without lesions or rashes.  Neurologic: Alert and oriented x 3.  Psych: Normal affect. Extremities: No clubbing or  cyanosis.  HEENT: Normal.   ECG: Most recent ECG reviewed.   Labs: Lab Results  Component Value Date/Time   K 4.1 06/09/2019 09:57 AM   BUN 13 06/09/2019 09:57 AM   CREATININE 0.92 06/09/2019 09:57 AM   CREATININE 0.79 03/11/2013 11:27 AM   ALT 11 05/18/2019 12:16 PM   TSH 0.066 (L) 05/18/2019 12:16 PM   HGB 13.8 05/18/2019 12:16 PM     Lipids: Lab Results  Component Value Date/Time   LDLCALC 94 05/18/2019 12:16 PM   LDLCALC 128 (H) 04/24/2014 02:20 PM   LDLCALC 107 (H) 03/11/2013 11:27 AM   CHOL 176 05/18/2019 12:16 PM   CHOL 188 03/11/2013 11:27 AM   TRIG 144 05/18/2019 12:16 PM   TRIG 141 12/26/2014 10:38 AM   TRIG 171 (H) 03/11/2013 11:27 AM   HDL 57 05/18/2019 12:16 PM   HDL 60 12/26/2014 10:38 AM   HDL 47 03/11/2013 11:27 AM        ASSESSMENT AND PLAN:  1.  Abnormal EKG: ECG demonstrates probable LVH with repolarization abnormalities.  She has hypertension.  She is asymptomatic.  In order to quantify and to determine if there is any significant left ventricular hypertrophy, I will obtain an echocardiogram.  2.  Hypertension: She does not have a blood pressure cuff at home.  She wonders if her blood pressure is elevated due to all the stress of taking care of her mother who passed away in September.  She does not appear to consume much caffeine or sodium.  The dose of lisinopril may need to be increased in  the future.   Disposition: Follow up in 4 months  Signed: Prentice DockerSuresh Koneswaran, M.D., F.A.C.C.  06/14/2019, 9:12 AM

## 2019-06-17 ENCOUNTER — Other Ambulatory Visit: Payer: Self-pay

## 2019-06-20 ENCOUNTER — Ambulatory Visit (INDEPENDENT_AMBULATORY_CARE_PROVIDER_SITE_OTHER): Payer: Medicare Other | Admitting: Family Medicine

## 2019-06-20 ENCOUNTER — Encounter: Payer: Self-pay | Admitting: Family Medicine

## 2019-06-20 VITALS — BP 147/69 | HR 94 | Temp 98.6°F | Resp 20 | Ht 61.0 in | Wt 121.0 lb

## 2019-06-20 DIAGNOSIS — I10 Essential (primary) hypertension: Secondary | ICD-10-CM

## 2019-06-20 MED ORDER — AMLODIPINE BESYLATE 5 MG PO TABS: 5.0000 mg | ORAL_TABLET | Freq: Every day | ORAL | 5 refills | Status: DC

## 2019-06-20 NOTE — Progress Notes (Signed)
Subjective:  Patient ID: [name], female    DOB: November 16, 1945  Age: 73 y.o. MRN: [id number]  CC: Medical Management of Chronic Issues (6 mo ), Hypertension, and Hypothyroidism   HPI [name] presents for  follow-up of hypertension. Patient has no history of headache chest pain or shortness of breath or recent cough. Patient also denies symptoms of TIA such as focal numbness or weakness. Patient reports that the recently added chlorthalidone is making her feel extremely weak.   History [name] has a past medical history of Allergy, Asthma, Cataract, Colon polyps, Hyperlipidemia, Menopausal state, Osteopenia, Rhinitis, Thyroid disease, and Vitamin D deficiency disease.   She has a past surgical history that includes Tonsillectomy and Cholecystectomy (2005).   Her family history includes Asthma in her brother; Gout in her sister; Heart attack in her father; Heart disease in her father; Hyperlipidemia in her mother; Hypertension in her brother, mother, and sister; Osteoporosis in her mother; Thyroid disease in her mother.She reports that she has never smoked. She has never used smokeless tobacco. She reports that she does not drink alcohol or use drugs.  Current Outpatient Medications on File Prior to Visit  Medication Sig Dispense Refill  . albuterol (PROVENTIL HFA;VENTOLIN HFA) 108 (90 Base) MCG/ACT inhaler Inhale 2 puffs into the lungs every 6 (six) hours as needed for wheezing. 1 Inhaler 11  . alendronate (FOSAMAX) 70 MG tablet Take 1 tablet (70 mg total) by mouth every 7 (seven) days. Take with a full glass of water on an empty stomach. 12 tablet 1  . Cholecalciferol (VITAMIN D3) 3000 UNITS TABS Take 2,000 Units by mouth daily.     . fluticasone (FLONASE) 50 MCG/ACT nasal spray USE TWO SPRAY(S) IN EACH NOSTRIL ONCE DAILY 48 g 1  . Fluticasone-Salmeterol (ADVAIR DISKUS) 100-50 MCG/DOSE AEPB INHALE 1 DOSE BY MOUTH TWICE DAILY 180 each 1  . levothyroxine (SYNTHROID) 50 MCG tablet Take  1 tablet (50 mcg total) by mouth daily. 90 tablet 3  . lisinopril (ZESTRIL) 10 MG tablet Take 1 tablet (10 mg total) by mouth daily. 90 tablet 3  . Multiple Vitamin (MULTIVITAMIN WITH MINERALS) TABS Take 1 tablet by mouth daily.     No current facility-administered medications on file prior to visit.     ROS Review of Systems  Constitutional: Negative.   HENT: Negative.   Eyes: Negative for visual disturbance.  Respiratory: Negative for shortness of breath.   Cardiovascular: Negative for chest pain.  Gastrointestinal: Negative for abdominal pain.  Musculoskeletal: Negative for arthralgias.    Objective:  BP (!) 147/69 (BP Location: Left Arm, Cuff Size: Normal)   Pulse 94   Temp 98.6 F (37 C)   Resp 20   Ht 5' 1"  (1.549 m)   Wt 121 lb (54.9 kg)   SpO2 100%   BMI 22.86 kg/m   BP Readings from Last 3 Encounters:  06/20/19 (!) 147/69  06/14/19 (!) 180/79  06/03/19 (!) 168/86    Wt Readings from Last 3 Encounters:  06/20/19 121 lb (54.9 kg)  06/14/19 120 lb (54.4 kg)  06/03/19 119 lb (54 kg)     Physical Exam Constitutional:      General: She is not in acute distress.    Appearance: She is well-developed.  Cardiovascular:     Rate and Rhythm: Normal rate and regular rhythm.  Pulmonary:     Breath sounds: Normal breath sounds.  Skin:    General: Skin is warm and dry.  Neurological:  Mental Status: She is alert and oriented to person, place, and time.       Assessment & Plan:   [name] was seen today for medical management of chronic issues, hypertension and hypothyroidism.  Diagnoses and all orders for this visit:  Essential hypertension -     CMP14+EGFR  Other orders -     amLODipine (NORVASC) 5 MG tablet; Take 1 tablet (5 mg total) by mouth daily. For blood pressure   Allergies as of 06/20/2019      Reactions   Evista [raloxifene Hcl] Other (See Comments)   Bone pain, couldn't put weight on right leg/foot   Fosamax [alendronate Sodium]     Bone pain   Red Dye    Sulfa Antibiotics       Medication List       Accurate as of June 20, 2019 12:36 PM. If you have any questions, ask your nurse or doctor.        STOP taking these medications   chlorthalidone 25 MG tablet Commonly known as: HYGROTON Stopped by: [name], MD     TAKE these medications   albuterol 108 (90 Base) MCG/ACT inhaler Commonly known as: VENTOLIN HFA Inhale 2 puffs into the lungs every 6 (six) hours as needed for wheezing.   alendronate 70 MG tablet Commonly known as: FOSAMAX Take 1 tablet (70 mg total) by mouth every 7 (seven) days. Take with a full glass of water on an empty stomach.   amLODipine 5 MG tablet Commonly known as: NORVASC Take 1 tablet (5 mg total) by mouth daily. For blood pressure Started by: [name], MD   fluticasone 50 MCG/ACT nasal spray Commonly known as: FLONASE USE TWO SPRAY(S) IN EACH NOSTRIL ONCE DAILY   Fluticasone-Salmeterol 100-50 MCG/DOSE Aepb Commonly known as: Advair Diskus INHALE 1 DOSE BY MOUTH TWICE DAILY   levothyroxine 50 MCG tablet Commonly known as: SYNTHROID Take 1 tablet (50 mcg total) by mouth daily.   lisinopril 10 MG tablet Commonly known as: ZESTRIL Take 1 tablet (10 mg total) by mouth daily.   multivitamin with minerals Tabs tablet Take 1 tablet by mouth daily.   Vitamin D3 75 MCG (3000 UT) Tabs Take 2,000 Units by mouth daily.       Meds ordered this encounter  Medications  . amLODipine (NORVASC) 5 MG tablet    Sig: Take 1 tablet (5 mg total) by mouth daily. For blood pressure    Dispense:  30 tablet    Refill:  5    Discontinue the hydroxyzine.  Take the amlodipine instead.  Continue lisinopril as is.  Follow-up: Return in about 1 month (around 07/21/2019).  [name], M.D.

## 2019-06-21 ENCOUNTER — Other Ambulatory Visit: Payer: Self-pay | Admitting: Family Medicine

## 2019-06-21 DIAGNOSIS — E871 Hypo-osmolality and hyponatremia: Secondary | ICD-10-CM

## 2019-06-21 LAB — CMP14+EGFR
ALT: 12 IU/L (ref 0–32)
AST: 16 IU/L (ref 0–40)
Albumin/Globulin Ratio: 1.5 (ref 1.2–2.2)
Albumin: 3.9 g/dL (ref 3.7–4.7)
Alkaline Phosphatase: 69 IU/L (ref 39–117)
BUN/Creatinine Ratio: 13 (ref 12–28)
BUN: 10 mg/dL (ref 8–27)
Bilirubin Total: 0.4 mg/dL (ref 0.0–1.2)
CO2: 30 mmol/L — ABNORMAL HIGH (ref 20–29)
Calcium: 10 mg/dL (ref 8.7–10.3)
Chloride: 88 mmol/L — ABNORMAL LOW (ref 96–106)
Creatinine, Ser: 0.8 mg/dL (ref 0.57–1.00)
GFR calc Af Amer: 83 mL/min/{1.73_m2} (ref >59)
GFR calc non Af Amer: 72 mL/min/{1.73_m2} (ref >59)
Globulin, Total: 2.6 g/dL (ref 1.5–4.5)
Glucose: 86 mg/dL (ref 65–99)
Potassium: 5.2 mmol/L (ref 3.5–5.2)
Sodium: 127 mmol/L — ABNORMAL LOW (ref 134–144)
Total Protein: 6.5 g/dL (ref 6.0–8.5)

## 2019-06-27 ENCOUNTER — Other Ambulatory Visit: Payer: Medicare Other

## 2019-06-27 ENCOUNTER — Ambulatory Visit: Payer: Medicare Other | Admitting: Family Medicine

## 2019-06-27 ENCOUNTER — Other Ambulatory Visit: Payer: Self-pay

## 2019-06-27 DIAGNOSIS — E871 Hypo-osmolality and hyponatremia: Secondary | ICD-10-CM

## 2019-06-28 ENCOUNTER — Other Ambulatory Visit: Payer: Self-pay | Admitting: Family Medicine

## 2019-06-28 LAB — BMP8+EGFR
BUN/Creatinine Ratio: 11 — ABNORMAL LOW (ref 12–28)
BUN: 9 mg/dL (ref 8–27)
CO2: 28 mmol/L (ref 20–29)
Calcium: 10.1 mg/dL (ref 8.7–10.3)
Chloride: 97 mmol/L (ref 96–106)
Creatinine, Ser: 0.8 mg/dL (ref 0.57–1.00)
GFR calc Af Amer: 83 mL/min/{1.73_m2} (ref >59)
GFR calc non Af Amer: 72 mL/min/{1.73_m2} (ref >59)
Glucose: 80 mg/dL (ref 65–99)
Potassium: 4.4 mmol/L (ref 3.5–5.2)
Sodium: 133 mmol/L — ABNORMAL LOW (ref 134–144)

## 2019-07-13 ENCOUNTER — Other Ambulatory Visit: Payer: Self-pay

## 2019-07-13 ENCOUNTER — Ambulatory Visit (INDEPENDENT_AMBULATORY_CARE_PROVIDER_SITE_OTHER): Payer: Medicare Other

## 2019-07-13 DIAGNOSIS — R9431 Abnormal electrocardiogram [ECG] [EKG]: Secondary | ICD-10-CM

## 2019-07-14 ENCOUNTER — Other Ambulatory Visit: Payer: Self-pay | Admitting: Family Medicine

## 2019-07-15 ENCOUNTER — Telehealth: Payer: Self-pay | Admitting: *Deleted

## 2019-07-15 NOTE — Telephone Encounter (Signed)
Notes recorded by Laurine Blazer, LPN on 30/94/0768 at 5:49 PM EST  Patient notified. Copy to pmd. Follow up scheduled for 11/13/2019 w/ Dr. Bronson Ing.   ------   Notes recorded by Herminio Commons, MD on 07/14/2019 at 8:33 AM EST  Normal pumping function.

## 2019-07-25 ENCOUNTER — Ambulatory Visit (INDEPENDENT_AMBULATORY_CARE_PROVIDER_SITE_OTHER): Payer: Medicare Other | Admitting: Family Medicine

## 2019-07-25 ENCOUNTER — Other Ambulatory Visit: Payer: Self-pay

## 2019-07-25 ENCOUNTER — Encounter: Payer: Self-pay | Admitting: Family Medicine

## 2019-07-25 VITALS — BP 155/72 | HR 89 | Temp 95.0°F | Ht 61.0 in | Wt 120.8 lb

## 2019-07-25 DIAGNOSIS — J4530 Mild persistent asthma, uncomplicated: Secondary | ICD-10-CM | POA: Diagnosis not present

## 2019-07-25 DIAGNOSIS — E039 Hypothyroidism, unspecified: Secondary | ICD-10-CM | POA: Diagnosis not present

## 2019-07-25 DIAGNOSIS — I10 Essential (primary) hypertension: Secondary | ICD-10-CM | POA: Diagnosis not present

## 2019-07-25 DIAGNOSIS — E782 Mixed hyperlipidemia: Secondary | ICD-10-CM | POA: Diagnosis not present

## 2019-07-25 MED ORDER — LISINOPRIL 20 MG PO TABS: 20.0000 mg | ORAL_TABLET | Freq: Every day | ORAL | 3 refills | Status: DC

## 2019-07-25 NOTE — Progress Notes (Signed)
Subjective:  Patient ID: [name], female    DOB: 1945/08/27  Age: 73 y.o. MRN: [id number]  CC: Hypertension (1 mth follow up. )   HPI [name] presents for  follow-up of hypertension. Patient has no history of headache chest pain or shortness of breath or recent cough. Patient also denies symptoms of TIA such as focal numbness or weakness. Patient denies side effects from medication. States taking it regularly.   follow-up on  thyroid. The patient has a history of hypothyroidism for many years. Dose adjusted  recently. Pt. denies any change in  voice, loss of hair, heat or cold intolerance. Energy level has been adequate to good. Patient denies constipation (occasioally 2 days between BMs, but no bloating or cramping) and diarrhea. No myxedema. Medication is as noted below. Verified that pt is taking it daily on an empty stomach. Well tolerated.    History [name] has a past medical history of Allergy, Asthma, Cataract, Colon polyps, Hyperlipidemia, Menopausal state, Osteopenia, Rhinitis, Thyroid disease, and Vitamin D deficiency disease.   She has a past surgical history that includes Tonsillectomy and Cholecystectomy (2005).   Her family history includes Asthma in her brother; Gout in her sister; Heart attack in her father; Heart disease in her father; Hyperlipidemia in her mother; Hypertension in her brother, mother, and sister; Osteoporosis in her mother; Thyroid disease in her mother.She reports that she has never smoked. She has never used smokeless tobacco. She reports that she does not drink alcohol or use drugs.  Current Outpatient Medications on File Prior to Visit  Medication Sig Dispense Refill  . albuterol (PROVENTIL HFA;VENTOLIN HFA) 108 (90 Base) MCG/ACT inhaler Inhale 2 puffs into the lungs every 6 (six) hours as needed for wheezing. 1 Inhaler 11  . alendronate (FOSAMAX) 70 MG tablet Take 1 tablet (70 mg total) by mouth every 7 (seven) days. Take with a full glass of  water on an empty stomach. 12 tablet 1  . amLODipine (NORVASC) 5 MG tablet Take 1 tablet (5 mg total) by mouth daily. For blood pressure 30 tablet 5  . Cholecalciferol (VITAMIN D3) 3000 UNITS TABS Take 2,000 Units by mouth daily.     . fluticasone (FLONASE) 50 MCG/ACT nasal spray Use 2 spray(s) in each nostril once daily 48 g 0  . Fluticasone-Salmeterol (ADVAIR) 100-50 MCG/DOSE AEPB INHALE 1 DOSE BY MOUTH TWICE DAILY 180 each 0  . levothyroxine (SYNTHROID) 50 MCG tablet Take 1 tablet (50 mcg total) by mouth daily. 90 tablet 3  . Multiple Vitamin (MULTIVITAMIN WITH MINERALS) TABS Take 1 tablet by mouth daily.     No current facility-administered medications on file prior to visit.     ROS Review of Systems  Constitutional: Negative.   HENT: Negative.   Eyes: Negative for visual disturbance.  Respiratory: Negative for shortness of breath.   Cardiovascular: Negative for chest pain.  Gastrointestinal: Negative for abdominal pain.  Musculoskeletal: Negative for arthralgias.    Objective:  BP (!) 155/72   Pulse 89   Temp (!) 95 F (35 C) (Temporal)   Ht 5\' 1"  (1.549 m)   Wt 120 lb 12.8 oz (54.8 kg)   SpO2 100%   BMI 22.82 kg/m   BP Readings from Last 3 Encounters:  07/25/19 (!) 155/72  06/20/19 (!) 147/69  06/14/19 (!) 180/79    Wt Readings from Last 3 Encounters:  07/25/19 120 lb 12.8 oz (54.8 kg)  06/20/19 121 lb (54.9 kg)  06/14/19 120 lb (54.4  kg)     Physical Exam Constitutional:      General: She is not in acute distress.    Appearance: She is well-developed.  HENT:     Head: Normocephalic.  Neck:     Musculoskeletal: Neck supple.  Cardiovascular:     Rate and Rhythm: Normal rate and regular rhythm.  Pulmonary:     Breath sounds: Normal breath sounds.  Skin:    General: Skin is warm and dry.  Neurological:     Mental Status: She is alert and oriented to person, place, and time.       Assessment & Plan:   [name] was seen today for hypertension.   Diagnoses and all orders for this visit:  Essential hypertension -     CBC -     CMP -     TSH + free T4 -     lisinopril (ZESTRIL) 20 MG tablet; Take 1 tablet (20 mg total) by mouth daily.  Mixed hyperlipidemia  Hypothyroidism, unspecified type -     CBC -     CMP -     TSH + free T4  Mild persistent chronic asthma without complication   Allergies as of 07/25/2019      Reactions   Evista [raloxifene Hcl] Other (See Comments)   Bone pain, couldn't put weight on right leg/foot   Fosamax [alendronate Sodium]    Bone pain   Red Dye    Sulfa Antibiotics       Medication List       Accurate as of July 25, 2019 10:32 AM. If you have any questions, ask your nurse or doctor.        albuterol 108 (90 Base) MCG/ACT inhaler Commonly known as: VENTOLIN HFA Inhale 2 puffs into the lungs every 6 (six) hours as needed for wheezing.   alendronate 70 MG tablet Commonly known as: FOSAMAX Take 1 tablet (70 mg total) by mouth every 7 (seven) days. Take with a full glass of water on an empty stomach.   amLODipine 5 MG tablet Commonly known as: NORVASC Take 1 tablet (5 mg total) by mouth daily. For blood pressure   fluticasone 50 MCG/ACT nasal spray Commonly known as: FLONASE Use 2 spray(s) in each nostril once daily   Fluticasone-Salmeterol 100-50 MCG/DOSE Aepb Commonly known as: ADVAIR INHALE 1 DOSE BY MOUTH TWICE DAILY   levothyroxine 50 MCG tablet Commonly known as: SYNTHROID Take 1 tablet (50 mcg total) by mouth daily.   lisinopril 20 MG tablet Commonly known as: ZESTRIL Take 1 tablet (20 mg total) by mouth daily. What changed:   medication strength  how much to take Changed by: [name], MD   multivitamin with minerals Tabs tablet Take 1 tablet by mouth daily.   Vitamin D3 75 MCG (3000 UT) Tabs Take 2,000 Units by mouth daily.       Meds ordered this encounter  Medications  . lisinopril (ZESTRIL) 20 MG tablet    Sig: Take 1 tablet (20 mg  total) by mouth daily.    Dispense:  90 tablet    Refill:  3    Follow BP at home  Follow-up: Return in about 6 months (around 01/22/2020).  [name], M.D.

## 2019-07-26 LAB — CMP14+EGFR
ALT: 10 IU/L (ref 0–32)
AST: 13 IU/L (ref 0–40)
Albumin/Globulin Ratio: 1.5 (ref 1.2–2.2)
Albumin: 4.1 g/dL (ref 3.7–4.7)
Alkaline Phosphatase: 65 IU/L (ref 39–117)
BUN/Creatinine Ratio: 8 — ABNORMAL LOW (ref 12–28)
BUN: 7 mg/dL — ABNORMAL LOW (ref 8–27)
Bilirubin Total: 0.4 mg/dL (ref 0.0–1.2)
CO2: 24 mmol/L (ref 20–29)
Calcium: 10.1 mg/dL (ref 8.7–10.3)
Chloride: 100 mmol/L (ref 96–106)
Creatinine, Ser: 0.9 mg/dL (ref 0.57–1.00)
GFR calc Af Amer: 72 mL/min/{1.73_m2} (ref >59)
GFR calc non Af Amer: 63 mL/min/{1.73_m2} (ref >59)
Globulin, Total: 2.8 g/dL (ref 1.5–4.5)
Glucose: 88 mg/dL (ref 65–99)
Potassium: 4.5 mmol/L (ref 3.5–5.2)
Sodium: 137 mmol/L (ref 134–144)
Total Protein: 6.9 g/dL (ref 6.0–8.5)

## 2019-07-26 LAB — TSH+FREE T4
Free T4: 1.22 ng/dL (ref 0.82–1.77)
TSH: 1.96 u[IU]/mL (ref 0.450–4.500)

## 2019-07-26 LAB — CBC WITH DIFFERENTIAL/PLATELET
Basophils Absolute: 0.1 10*3/uL (ref 0.0–0.2)
Basos: 1 %
EOS (ABSOLUTE): 0.1 10*3/uL (ref 0.0–0.4)
Eos: 2 %
Hematocrit: 40.9 % (ref 34.0–46.6)
Hemoglobin: 13.4 g/dL (ref 11.1–15.9)
Immature Grans (Abs): 0 10*3/uL (ref 0.0–0.1)
Immature Granulocytes: 0 %
Lymphocytes Absolute: 0.8 10*3/uL (ref 0.7–3.1)
Lymphs: 14 %
MCH: 29 pg (ref 26.6–33.0)
MCHC: 32.8 g/dL (ref 31.5–35.7)
MCV: 89 fL (ref 79–97)
Monocytes Absolute: 0.3 10*3/uL (ref 0.1–0.9)
Monocytes: 6 %
Neutrophils Absolute: 4.4 10*3/uL (ref 1.4–7.0)
Neutrophils: 77 %
Platelets: 302 10*3/uL (ref 150–450)
RBC: 4.62 x10E6/uL (ref 3.77–5.28)
RDW: 13.3 % (ref 11.7–15.4)
WBC: 5.7 10*3/uL (ref 3.4–10.8)

## 2019-07-26 NOTE — Progress Notes (Signed)
Hello Lynix,  Your lab result is normal and/or stable.Some minor variations that are not significant are commonly marked abnormal, but do not represent any medical problem for you.  Best regards, Tanaisha Pittman, M.D.

## 2019-08-09 DIAGNOSIS — Z1231 Encounter for screening mammogram for malignant neoplasm of breast: Secondary | ICD-10-CM | POA: Diagnosis not present

## 2019-10-11 ENCOUNTER — Other Ambulatory Visit: Payer: Self-pay | Admitting: Family Medicine

## 2019-10-24 ENCOUNTER — Ambulatory Visit: Payer: PRIVATE HEALTH INSURANCE | Admitting: Cardiovascular Disease

## 2019-11-15 ENCOUNTER — Telehealth: Payer: Self-pay | Admitting: Family Medicine

## 2019-11-15 ENCOUNTER — Encounter: Payer: Self-pay | Admitting: Cardiovascular Disease

## 2019-11-15 ENCOUNTER — Other Ambulatory Visit: Payer: Self-pay

## 2019-11-15 ENCOUNTER — Ambulatory Visit (INDEPENDENT_AMBULATORY_CARE_PROVIDER_SITE_OTHER): Payer: Medicare Other | Admitting: Cardiovascular Disease

## 2019-11-15 VITALS — BP 164/80 | HR 96 | Ht 61.0 in | Wt 124.8 lb

## 2019-11-15 DIAGNOSIS — R9431 Abnormal electrocardiogram [ECG] [EKG]: Secondary | ICD-10-CM | POA: Diagnosis not present

## 2019-11-15 DIAGNOSIS — I10 Essential (primary) hypertension: Secondary | ICD-10-CM | POA: Diagnosis not present

## 2019-11-15 NOTE — Chronic Care Management (AMB) (Signed)
  Chronic Care Management   Note  11/15/2019 Name: [name] MRN: [id number] DOB: [birth year]  [name] is a 74 y.o. year old female who is a primary care patient of Stacks, Cletus Gash, MD. I reached out to [name] by phone today in response to a referral sent by Ms. Koren Shiver [employer]'s health plan.     [name] was given information about Chronic Care Management services today including:  1. CCM service includes personalized support from designated clinical staff supervised by her physician, including individualized plan of care and coordination with other care providers 2. 24/7 contact phone numbers for assistance for urgent and routine care needs. 3. Service will only be billed when office clinical staff spend 20 minutes or more in a month to coordinate care. 4. Only one practitioner may furnish and bill the service in a calendar month. 5. The patient may stop CCM services at any time (effective at the end of the month) by phone call to the office staff. 6. The patient will be responsible for cost sharing (co-pay) of up to 20% of the service fee (after annual deductible is met).  Patient agreed to services and verbal consent obtained.   Follow up plan: Telephone appointment with care management team member scheduled for:05/03/2020.  [place], [place] [address] Direct Dial: [phone] [email or web address]: [email or web address]

## 2019-11-15 NOTE — Progress Notes (Signed)
SUBJECTIVE: [name] has been doing well.  She denies chest pain, palpitations, shortness of breath, leg swelling.  She checks her blood pressure and it appears to fluctuate with some normotensive readings and some hypertensive readings.  Overall she says she feels less fatigue than she had been feeling at her last visit with me.  She thinks it was due to being on a diuretic.      Review of Systems: As per "subjective", otherwise negative.  Allergies  Allergen Reactions  . Evista [Raloxifene Hcl] Other (See Comments)    Bone pain, couldn't put weight on right leg/foot  . Fosamax [Alendronate Sodium]     Bone pain  . Red Dye   . Sulfa Antibiotics     Current Outpatient Medications  Medication Sig Dispense Refill  . albuterol (PROVENTIL HFA;VENTOLIN HFA) 108 (90 Base) MCG/ACT inhaler Inhale 2 puffs into the lungs every 6 (six) hours as needed for wheezing. 1 Inhaler 11  . alendronate (FOSAMAX) 70 MG tablet Take 1 tablet (70 mg total) by mouth every 7 (seven) days. Take with a full glass of water on an empty stomach. 12 tablet 1  . amLODipine (NORVASC) 5 MG tablet Take 1 tablet (5 mg total) by mouth daily. For blood pressure 30 tablet 5  . Cholecalciferol (VITAMIN D3) 3000 UNITS TABS Take 2,000 Units by mouth daily.     . fluticasone (FLONASE) 50 MCG/ACT nasal spray Use 2 spray(s) in each nostril once daily 48 g 0  . Fluticasone-Salmeterol (ADVAIR) 100-50 MCG/DOSE AEPB INHALE 1 DOSE BY MOUTH TWICE DAILY 180 each 0  . levothyroxine (SYNTHROID) 50 MCG tablet Take 1 tablet (50 mcg total) by mouth daily. 90 tablet 3  . lisinopril (ZESTRIL) 20 MG tablet Take 1 tablet (20 mg total) by mouth daily. 90 tablet 3  . Multiple Vitamin (MULTIVITAMIN WITH MINERALS) TABS Take 1 tablet by mouth daily.     No current facility-administered medications for this visit.    Past Medical History:  Diagnosis Date  . Allergy    seasonal  . Asthma   . Cataract   . Colon polyps   .  Hyperlipidemia   . Menopausal state   . Osteopenia   . Rhinitis   . Thyroid disease   . Vitamin D deficiency disease     Past Surgical History:  Procedure Laterality Date  . CHOLECYSTECTOMY  2005  . TONSILLECTOMY      Social History   Socioeconomic History  . Marital status: Single    Spouse name: Not on file  . Number of children: 0  . Years of education: 32  . Highest education level: 12th grade  Occupational History  . 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 Smoking status: Never Smoker  . Smokeless tobacco: Never Used  Substance and Sexual Activity  . Alcohol use: No  . Drug use: No  . Sexual activity: Never  Other Topics Concern  . Not on file  Social History Narrative   Lives at home with her mother. Never married and no children. Retired from [employer] in the [employer].    Social Determinants of Health   Financial Resource Strain:   . Difficulty of Paying Living Expenses:   Food Insecurity:   . Worried About Programme researcher, broadcasting/film/video in the Last Year:   . Barista in the Last Year:   Transportation Needs:   .  Lack of Transportation (Medical):   Marland Kitchen Lack of Transportation (Non-Medical):   Physical Activity:   . Days of Exercise per Week:   . Minutes of Exercise per Session:   Stress:   . Feeling of Stress :   Social Connections:   . Frequency of Communication with Friends and Family:   . Frequency of Social Gatherings with Friends and Family:   . Attends Religious Services:   . Active Member of Clubs or Organizations:   . Attends Archivist Meetings:   Marland Kitchen Marital Status:   Intimate Partner Violence: Not At Risk  . Fear of Current or Ex-Partner: No  . Emotionally Abused: No  . Physically Abused: No  . Sexually Abused: No    Orson Slick, LPN was present throughout the entirety of the encounter.  Vitals:   11/15/19 1406  BP: (!) 164/80  Pulse: 96    SpO2: 98%  Weight: 124 lb 12.8 oz (56.6 kg)  Height: 5\' 1"  (1.549 m)    Wt Readings from Last 3 Encounters:  11/15/19 124 lb 12.8 oz (56.6 kg)  07/25/19 120 lb 12.8 oz (54.8 kg)  06/20/19 121 lb (54.9 kg)     PHYSICAL EXAM General: NAD HEENT: Normal. Neck: No JVD, no thyromegaly. Lungs: Clear to auscultation bilaterally with normal respiratory effort. CV: Regular rate and rhythm, normal S1/S2, no S3/S4, no murmur. No pretibial or periankle edema.   Abdomen: Soft, nontender, no distention.  Neurologic: Alert and oriented.  Psych: Normal affect. Skin: Normal. Musculoskeletal: No gross deformities.      Labs: Lab Results  Component Value Date/Time   K 4.5 07/25/2019 10:58 AM   BUN 7 (L) 07/25/2019 10:58 AM   CREATININE 0.90 07/25/2019 10:58 AM   CREATININE 0.79 03/11/2013 11:27 AM   ALT 10 07/25/2019 10:58 AM   TSH 1.960 07/25/2019 10:58 AM   HGB 13.4 07/25/2019 10:58 AM     Lipids: Lab Results  Component Value Date/Time   LDLCALC 94 05/18/2019 12:16 PM   LDLCALC 128 (H) 04/24/2014 02:20 PM   LDLCALC 107 (H) 03/11/2013 11:27 AM   CHOL 176 05/18/2019 12:16 PM   CHOL 188 03/11/2013 11:27 AM   TRIG 144 05/18/2019 12:16 PM   TRIG 141 12/26/2014 10:38 AM   TRIG 171 (H) 03/11/2013 11:27 AM   HDL 57 05/18/2019 12:16 PM   HDL 60 12/26/2014 10:38 AM   HDL 47 03/11/2013 11:27 AM      Echocardiogram 07/13/19:  1. Left ventricular ejection fraction, by visual estimation, is 70 to  75%. The left ventricle has hyperdynamic function. There is no left  ventricular hypertrophy.  2. Left ventricular diastolic parameters are indeterminate.  3. Global right ventricle has normal systolic function.The right  ventricular size is normal. No increase in right ventricular wall  thickness.  4. Left atrial size was normal.  5. Right atrial size was normal.  6. Mild aortic valve annular calcification.  7. The mitral valve is grossly normal. Mild mitral valve  regurgitation.  8. The tricuspid valve is grossly normal. Tricuspid valve regurgitation  is trivial.  9. The aortic valve was not well visualized. Aortic valve regurgitation  is not visualized. Mild aortic valve sclerosis without stenosis.  10. The pulmonic valve was grossly normal. Pulmonic valve regurgitation is  trivial.  11. Moderately elevated pulmonary artery systolic pressure.  12. The tricuspid regurgitant velocity is 3.08 m/s, and with an assumed  right atrial pressure of 3 mmHg, the estimated right ventricular systolic  pressure is moderately elevated at 40.9 mmHg.  13. The inferior vena cava is normal in size with greater than 50%  respiratory variability, suggesting right atrial pressure of 3 mmHg.  14. Evidence of atrial level shunting detected by color flow Doppler.    ASSESSMENT AND PLAN:  1.  Abnormal EKG: Echocardiogram reviewed in detail above with vigorous LV systolic function.  She denies anginal symptoms.  No further cardiac testing is indicated at this time.  2.  Hypertension: Blood pressure is elevated today but it appears to fluctuate.  I educated her on the importance of control blood pressure to prevent cardiovascular disease and renal disease.  She will continue to monitor this.   Disposition: Follow up as needed   Prentice Docker, M.D., F.A.C.C.

## 2019-11-15 NOTE — Patient Instructions (Signed)
Medication Instructions:  Continue all current medications.  Labwork: none  Testing/Procedures: none  Follow-Up: As needed.    Any Other Special Instructions Will Be Listed Below (If Applicable).  If you need a refill on your cardiac medications before your next appointment, please call your pharmacy.  

## 2019-12-01 DIAGNOSIS — Z23 Encounter for immunization: Secondary | ICD-10-CM | POA: Diagnosis not present

## 2019-12-10 ENCOUNTER — Other Ambulatory Visit: Payer: Self-pay | Admitting: Family Medicine

## 2019-12-23 DIAGNOSIS — Z23 Encounter for immunization: Secondary | ICD-10-CM | POA: Diagnosis not present

## 2020-01-09 ENCOUNTER — Other Ambulatory Visit: Payer: Self-pay | Admitting: Family Medicine

## 2020-01-24 ENCOUNTER — Ambulatory Visit (INDEPENDENT_AMBULATORY_CARE_PROVIDER_SITE_OTHER): Payer: Medicare Other | Admitting: Family Medicine

## 2020-01-24 ENCOUNTER — Other Ambulatory Visit: Payer: Self-pay

## 2020-01-24 ENCOUNTER — Encounter: Payer: Self-pay | Admitting: Family Medicine

## 2020-01-24 VITALS — BP 136/78 | HR 75 | Temp 98.0°F | Ht 61.0 in | Wt 127.8 lb

## 2020-01-24 DIAGNOSIS — E039 Hypothyroidism, unspecified: Secondary | ICD-10-CM | POA: Diagnosis not present

## 2020-01-24 DIAGNOSIS — J4530 Mild persistent asthma, uncomplicated: Secondary | ICD-10-CM | POA: Diagnosis not present

## 2020-01-24 DIAGNOSIS — E782 Mixed hyperlipidemia: Secondary | ICD-10-CM

## 2020-01-24 DIAGNOSIS — I10 Essential (primary) hypertension: Secondary | ICD-10-CM | POA: Diagnosis not present

## 2020-01-24 DIAGNOSIS — J4540 Moderate persistent asthma, uncomplicated: Secondary | ICD-10-CM | POA: Diagnosis not present

## 2020-01-24 DIAGNOSIS — M8589 Other specified disorders of bone density and structure, multiple sites: Secondary | ICD-10-CM

## 2020-01-24 MED ORDER — FLUTICASONE PROPIONATE 50 MCG/ACT NA SUSP: NASAL | 0 refills | Status: DC

## 2020-01-24 MED ORDER — AMLODIPINE BESYLATE 5 MG PO TABS: ORAL_TABLET | ORAL | 1 refills | Status: DC

## 2020-01-24 MED ORDER — FLUTICASONE-SALMETEROL 100-50 MCG/DOSE IN AEPB: INHALATION_SPRAY | RESPIRATORY_TRACT | 3 refills | Status: DC

## 2020-01-24 MED ORDER — LISINOPRIL 20 MG PO TABS: 20.0000 mg | ORAL_TABLET | Freq: Every day | ORAL | 3 refills | Status: DC

## 2020-01-24 MED ORDER — ALBUTEROL SULFATE HFA 108 (90 BASE) MCG/ACT IN AERS: 2.0000 | INHALATION_SPRAY | Freq: Four times a day (QID) | RESPIRATORY_TRACT | 11 refills | Status: AC | PRN

## 2020-01-24 NOTE — Progress Notes (Signed)
Subjective:  Patient ID: [name], female    DOB: 06-13-1946  Age: 74 y.o. MRN: [id number]  CC: Follow-up (6 month)   HPI [name] presents for  follow-up of hypertension. Patient has no history of headache chest pain or shortness of breath or recent cough. Patient also denies symptoms of TIA such as focal numbness or weakness. Patient denies side effects from medication. States taking it regularly.    follow-up on  thyroid. The patient has a history of hypothyroidism for many years. It has been stable recently. Pt. denies any change in  voice, loss of hair, heat or cold intolerance.  Patient says she is tired all the time.  "I stay tired."  Patient denies constipation and diarrhea. No myxedema. Medication is as noted below. Verified that pt is taking it daily on an empty stomach. Well tolerated.  Patient tells me she has trouble remembering the Fosamax because it is once a week.  She would like to go back to the Evista and give it another try since it is every day and she can remember it more easily.  History Minnie has a past medical history of Allergy, Asthma, Cataract, Colon polyps, Hyperlipidemia, Menopausal state, Osteopenia, Rhinitis, Thyroid disease, and Vitamin D deficiency disease.   She has a past surgical history that includes Tonsillectomy and Cholecystectomy (2005).   Her family history includes Asthma in her brother; Gout in her sister; Heart attack in her father; Heart disease in her father; Hyperlipidemia in her mother; Hypertension in her brother, mother, and sister; Osteoporosis in her mother; Thyroid disease in her mother.She reports that she has never smoked. She has never used smokeless tobacco. She reports that she does not drink alcohol or use drugs.  Current Outpatient Medications on File Prior to Visit  Medication Sig Dispense Refill  . Cholecalciferol (VITAMIN D3) 3000 UNITS TABS Take 2,000 Units by mouth daily.     Marland Kitchen levothyroxine (SYNTHROID) 50 MCG  tablet Take 1 tablet (50 mcg total) by mouth daily. 90 tablet 3  . Multiple Vitamin (MULTIVITAMIN WITH MINERALS) TABS Take 1 tablet by mouth daily.     No current facility-administered medications on file prior to visit.    ROS Review of Systems  Constitutional: Positive for fatigue.  HENT: Negative.   Eyes: Negative for visual disturbance.  Respiratory: Negative for shortness of breath.   Cardiovascular: Negative for chest pain.  Gastrointestinal: Negative for abdominal pain.  Musculoskeletal: Negative for arthralgias.    Objective:  BP 136/78   Pulse 75   Temp 98 F (36.7 C) (Temporal)   Ht 5' 1"  (1.549 m)   Wt 127 lb 12.8 oz (58 kg)   BMI 24.15 kg/m   BP Readings from Last 3 Encounters:  01/24/20 136/78  11/15/19 (!) 164/80  07/25/19 (!) 155/72    Wt Readings from Last 3 Encounters:  01/24/20 127 lb 12.8 oz (58 kg)  11/15/19 124 lb 12.8 oz (56.6 kg)  07/25/19 120 lb 12.8 oz (54.8 kg)     Physical Exam Constitutional:      General: She is not in acute distress.    Appearance: She is well-developed.  HENT:     Head: Normocephalic and atraumatic.  Eyes:     Conjunctiva/sclera: Conjunctivae normal.     Pupils: Pupils are equal, round, and reactive to light.  Neck:     Thyroid: No thyromegaly.  Cardiovascular:     Rate and Rhythm: Normal rate and regular rhythm.  Heart sounds: Normal heart sounds. No murmur.  Pulmonary:     Effort: Pulmonary effort is normal. No respiratory distress.     Breath sounds: Normal breath sounds. No wheezing or rales.  Abdominal:     General: There is no distension.     Palpations: Abdomen is soft.     Tenderness: There is no abdominal tenderness.  Musculoskeletal:        General: Normal range of motion.     Cervical back: Normal range of motion and neck supple.  Lymphadenopathy:     Cervical: No cervical adenopathy.  Skin:    General: Skin is warm and dry.  Neurological:     Mental Status: She is alert and oriented to  person, place, and time.  Psychiatric:        Behavior: Behavior normal.        Thought Content: Thought content normal.        Judgment: Judgment normal.       Assessment & Plan:   Shantia was seen today for follow-up.  Diagnoses and all orders for this visit:  Hypothyroidism, unspecified type -     CBC with Differential/Platelet -     CMP14+EGFR -     TSH + free T4  Mixed hyperlipidemia -     CBC with Differential/Platelet -     CMP14+EGFR -     Lipid panel  Essential hypertension -     CBC with Differential/Platelet -     CMP14+EGFR -     amLODipine (NORVASC) 5 MG tablet; Take 1 tablet by mouth once daily for blood pressure -     lisinopril (ZESTRIL) 20 MG tablet; Take 1 tablet (20 mg total) by mouth daily.  Osteopenia of multiple sites -     TSH + free T4 -     DG Bone Density; Future  Asthma, chronic, moderate persistent, uncomplicated -     CBC with Differential/Platelet -     CMP14+EGFR -     albuterol (VENTOLIN HFA) 108 (90 Base) MCG/ACT inhaler; Inhale 2 puffs into the lungs every 6 (six) hours as needed for wheezing. -     fluticasone (FLONASE) 50 MCG/ACT nasal spray; Use 2 spray(s) in each nostril once daily -     Fluticasone-Salmeterol (ADVAIR) 100-50 MCG/DOSE AEPB; INHALE 1 DOSE BY MOUTH TWICE DAILY   Allergies as of 01/24/2020      Reactions   Evista [raloxifene Hcl] Other (See Comments)   Bone pain, couldn't put weight on right leg/foot   Fosamax [alendronate Sodium]    Bone pain   Red Dye    Sulfa Antibiotics       Medication List       Accurate as of January 24, 2020 11:18 AM. If you have any questions, ask your nurse or doctor.        STOP taking these medications   alendronate 70 MG tablet Commonly known as: FOSAMAX Stopped by: [name], MD     TAKE these medications   albuterol 108 (90 Base) MCG/ACT inhaler Commonly known as: VENTOLIN HFA Inhale 2 puffs into the lungs every 6 (six) hours as needed for wheezing.   amLODipine 5 MG  tablet Commonly known as: NORVASC Take 1 tablet by mouth once daily for blood pressure   fluticasone 50 MCG/ACT nasal spray Commonly known as: FLONASE Use 2 spray(s) in each nostril once daily   Fluticasone-Salmeterol 100-50 MCG/DOSE Aepb Commonly known as: ADVAIR INHALE 1 DOSE BY MOUTH TWICE DAILY  levothyroxine 50 MCG tablet Commonly known as: SYNTHROID Take 1 tablet (50 mcg total) by mouth daily.   lisinopril 20 MG tablet Commonly known as: ZESTRIL Take 1 tablet (20 mg total) by mouth daily.   multivitamin with minerals Tabs tablet Take 1 tablet by mouth daily.   Vitamin D3 75 MCG (3000 UT) Tabs Take 2,000 Units by mouth daily.       Meds ordered this encounter  Medications  . albuterol (VENTOLIN HFA) 108 (90 Base) MCG/ACT inhaler    Sig: Inhale 2 puffs into the lungs every 6 (six) hours as needed for wheezing.    Dispense:  18 g    Refill:  11  . amLODipine (NORVASC) 5 MG tablet    Sig: Take 1 tablet by mouth once daily for blood pressure    Dispense:  90 tablet    Refill:  1  . fluticasone (FLONASE) 50 MCG/ACT nasal spray    Sig: Use 2 spray(s) in each nostril once daily    Dispense:  48 g    Refill:  0  . Fluticasone-Salmeterol (ADVAIR) 100-50 MCG/DOSE AEPB    Sig: INHALE 1 DOSE BY MOUTH TWICE DAILY    Dispense:  180 each    Refill:  3  . lisinopril (ZESTRIL) 20 MG tablet    Sig: Take 1 tablet (20 mg total) by mouth daily.    Dispense:  90 tablet    Refill:  3    Patient's fatigue may well be a result of his thyroid.  We will see when her TSH and T4 come back.  There do not seem to be any other stigmata of underactive thyroid at this time.  If there is any room for increasing the dose with an the normal range of thyroid function I will go ahead with that to see if it helps.  Otherwise she may have to look at lifestyle and age.  Follow-up: Return in about 6 months (around 07/25/2020).  [name], M.D.

## 2020-01-25 LAB — CMP14+EGFR
ALT: 11 IU/L (ref 0–32)
AST: 16 IU/L (ref 0–40)
Albumin/Globulin Ratio: 1.4 (ref 1.2–2.2)
Albumin: 4.2 g/dL (ref 3.7–4.7)
Alkaline Phosphatase: 65 IU/L (ref 48–121)
BUN/Creatinine Ratio: 10 — ABNORMAL LOW (ref 12–28)
BUN: 9 mg/dL (ref 8–27)
Bilirubin Total: 0.5 mg/dL (ref 0.0–1.2)
CO2: 25 mmol/L (ref 20–29)
Calcium: 10.8 mg/dL — ABNORMAL HIGH (ref 8.7–10.3)
Chloride: 101 mmol/L (ref 96–106)
Creatinine, Ser: 0.92 mg/dL (ref 0.57–1.00)
GFR calc Af Amer: 70 mL/min/{1.73_m2} (ref >59)
GFR calc non Af Amer: 61 mL/min/{1.73_m2} (ref >59)
Globulin, Total: 2.9 g/dL (ref 1.5–4.5)
Glucose: 86 mg/dL (ref 65–99)
Potassium: 5.2 mmol/L (ref 3.5–5.2)
Sodium: 139 mmol/L (ref 134–144)
Total Protein: 7.1 g/dL (ref 6.0–8.5)

## 2020-01-25 LAB — LIPID PANEL
Chol/HDL Ratio: 3.1 ratio (ref 0.0–4.4)
Cholesterol, Total: 211 mg/dL — ABNORMAL HIGH (ref 100–199)
HDL: 69 mg/dL (ref >39)
LDL Chol Calc (NIH): 122 mg/dL — ABNORMAL HIGH (ref 0–99)
Triglycerides: 115 mg/dL (ref 0–149)
VLDL Cholesterol Cal: 20 mg/dL (ref 5–40)

## 2020-01-25 LAB — TSH+FREE T4
Free T4: 1.14 ng/dL (ref 0.82–1.77)
TSH: 5.92 u[IU]/mL — ABNORMAL HIGH (ref 0.450–4.500)

## 2020-01-25 LAB — CBC WITH DIFFERENTIAL/PLATELET
Basophils Absolute: 0.1 10*3/uL (ref 0.0–0.2)
Basos: 1 %
EOS (ABSOLUTE): 0.1 10*3/uL (ref 0.0–0.4)
Eos: 3 %
Hematocrit: 41.2 % (ref 34.0–46.6)
Hemoglobin: 13.9 g/dL (ref 11.1–15.9)
Immature Grans (Abs): 0 10*3/uL (ref 0.0–0.1)
Immature Granulocytes: 0 %
Lymphocytes Absolute: 0.8 10*3/uL (ref 0.7–3.1)
Lymphs: 17 %
MCH: 30.2 pg (ref 26.6–33.0)
MCHC: 33.7 g/dL (ref 31.5–35.7)
MCV: 89 fL (ref 79–97)
Monocytes Absolute: 0.3 10*3/uL (ref 0.1–0.9)
Monocytes: 6 %
Neutrophils Absolute: 3.4 10*3/uL (ref 1.4–7.0)
Neutrophils: 73 %
Platelets: 290 10*3/uL (ref 150–450)
RBC: 4.61 x10E6/uL (ref 3.77–5.28)
RDW: 13.1 % (ref 11.7–15.4)
WBC: 4.7 10*3/uL (ref 3.4–10.8)

## 2020-01-27 ENCOUNTER — Other Ambulatory Visit: Payer: Self-pay | Admitting: Family Medicine

## 2020-01-27 DIAGNOSIS — E039 Hypothyroidism, unspecified: Secondary | ICD-10-CM

## 2020-01-27 MED ORDER — LEVOTHYROXINE SODIUM 75 MCG PO TABS: 75.0000 ug | ORAL_TABLET | Freq: Every day | ORAL | 1 refills | Status: DC

## 2020-01-30 ENCOUNTER — Telehealth: Payer: Self-pay | Admitting: Family Medicine

## 2020-01-30 NOTE — Telephone Encounter (Signed)
Pt returned missed call from [facility] regarding lab results. Reviewed results with pt per Dr Darlyn Read notes. Pt voiced understanding. Also scheduled pt to come in and see Dr Darlyn Read for 6 wk thyroid recheck on 03/12/20. Pt will come in a few days before appt to do lab work.

## 2020-02-01 ENCOUNTER — Other Ambulatory Visit: Payer: Self-pay | Admitting: *Deleted

## 2020-02-01 DIAGNOSIS — E039 Hypothyroidism, unspecified: Secondary | ICD-10-CM

## 2020-03-08 ENCOUNTER — Other Ambulatory Visit: Payer: Medicare Other

## 2020-03-08 ENCOUNTER — Other Ambulatory Visit: Payer: Self-pay

## 2020-03-08 DIAGNOSIS — E039 Hypothyroidism, unspecified: Secondary | ICD-10-CM | POA: Diagnosis not present

## 2020-03-09 LAB — T4 AND TSH
T4, Total: 9.5 ug/dL (ref 4.5–12.0)
TSH: 0.211 u[IU]/mL — ABNORMAL LOW (ref 0.450–4.500)

## 2020-03-12 ENCOUNTER — Encounter: Payer: Self-pay | Admitting: Family Medicine

## 2020-03-12 ENCOUNTER — Ambulatory Visit (INDEPENDENT_AMBULATORY_CARE_PROVIDER_SITE_OTHER): Payer: Medicare Other | Admitting: Family Medicine

## 2020-03-12 ENCOUNTER — Other Ambulatory Visit: Payer: Self-pay

## 2020-03-12 VITALS — BP 135/77 | HR 85 | Temp 98.3°F | Resp 20 | Ht 61.0 in | Wt 130.5 lb

## 2020-03-12 DIAGNOSIS — E039 Hypothyroidism, unspecified: Secondary | ICD-10-CM

## 2020-03-12 NOTE — Progress Notes (Signed)
Subjective:  Patient ID: [name], female    DOB: [birth year]  Age: 74 y.o. MRN: [id number]  CC: Recheck thyroid   HPI [name] presents for patient presents for follow-up on  thyroid. The patient has a history of hypothyroidism for many years.  Dose was recently adjusted upward.  She tells me that her energy has improved dramatically.  Pt. denies any change in  voice, loss of hair, heat or cold intolerance. Patient denies constipation and diarrhea. No myxedema. Medication is as noted below. Verified that pt is taking it daily on an empty stomach. Well tolerated.  Depression screen [facility] 2/9 03/12/2020 01/24/2020 07/25/2019  Decreased Interest 0 0 0  Down, Depressed, Hopeless 0 0 0  PHQ - 2 Score 0 0 0    History [name] has a past medical history of Allergy, Asthma, Cataract, Colon polyps, Hyperlipidemia, Menopausal state, Osteopenia, Rhinitis, Thyroid disease, and Vitamin D deficiency disease.   She has a past surgical history that includes Tonsillectomy and Cholecystectomy (2005).   Her family history includes Asthma in her brother; Gout in her sister; Heart attack in her father; Heart disease in her father; Hyperlipidemia in her mother; Hypertension in her brother, mother, and sister; Osteoporosis in her mother; Thyroid disease in her mother.She reports that she has never smoked. She has never used smokeless tobacco. She reports that she does not drink alcohol and does not use drugs.    ROS Review of Systems  Constitutional: Negative.   HENT: Negative.   Eyes: Negative for visual disturbance.  Respiratory: Negative for shortness of breath.   Cardiovascular: Negative for chest pain.  Gastrointestinal: Negative for abdominal pain.  Musculoskeletal: Negative for arthralgias.    Objective:  BP 135/77   Pulse 85   Temp 98.3 F (36.8 C) (Temporal)   Resp 20   Ht 5\' 1"  (1.549 m)   Wt 130 lb 8 oz (59.2 kg)   SpO2 97%   BMI 24.66 kg/m   BP Readings from Last 3  Encounters:  03/12/20 135/77  01/24/20 136/78  11/15/19 (!) 164/80    Wt Readings from Last 3 Encounters:  03/12/20 130 lb 8 oz (59.2 kg)  01/24/20 127 lb 12.8 oz (58 kg)  11/15/19 124 lb 12.8 oz (56.6 kg)     Physical Exam Constitutional:      General: She is not in acute distress.    Appearance: She is well-developed.  Cardiovascular:     Rate and Rhythm: Normal rate and regular rhythm.  Pulmonary:     Breath sounds: Normal breath sounds.  Skin:    General: Skin is warm and dry.  Neurological:     Mental Status: She is alert and oriented to person, place, and time.       Assessment & Plan:   [name] was seen today for recheck thyroid.  Diagnoses and all orders for this visit:  Hypothyroidism, unspecified type       I am having [name] "[name]" maintain her multivitamin with minerals, Vitamin D3, albuterol, amLODipine, fluticasone, Fluticasone-Salmeterol, lisinopril, and levothyroxine.  Allergies as of 03/12/2020      Reactions   Evista [raloxifene Hcl] Other (See Comments)   Bone pain, couldn't put weight on right leg/foot   Fosamax [alendronate Sodium]    Bone pain   Red Dye    Sulfa Antibiotics       Medication List       Accurate as of March 12, 2020  7:28 PM. If  you have any questions, ask your nurse or doctor.        albuterol 108 (90 Base) MCG/ACT inhaler Commonly known as: VENTOLIN HFA Inhale 2 puffs into the lungs every 6 (six) hours as needed for wheezing.   amLODipine 5 MG tablet Commonly known as: NORVASC Take 1 tablet by mouth once daily for blood pressure   fluticasone 50 MCG/ACT nasal spray Commonly known as: FLONASE Use 2 spray(s) in each nostril once daily   Fluticasone-Salmeterol 100-50 MCG/DOSE Aepb Commonly known as: ADVAIR INHALE 1 DOSE BY MOUTH TWICE DAILY   levothyroxine 75 MCG tablet Commonly known as: SYNTHROID Take 1 tablet (75 mcg total) by mouth daily.   lisinopril 20 MG tablet Commonly known as:  ZESTRIL Take 1 tablet (20 mg total) by mouth daily.   multivitamin with minerals Tabs tablet Take 1 tablet by mouth daily.   Vitamin D3 75 MCG (3000 UT) Tabs Take 2,000 Units by mouth daily.        Follow-up: Return in about 6 months (around 09/12/2020).  Mechele Claude, M.D.

## 2020-05-03 ENCOUNTER — Telehealth: Payer: Medicare Other | Admitting: *Deleted

## 2020-05-21 ENCOUNTER — Ambulatory Visit (INDEPENDENT_AMBULATORY_CARE_PROVIDER_SITE_OTHER): Payer: Medicare Other

## 2020-05-21 ENCOUNTER — Other Ambulatory Visit: Payer: Self-pay

## 2020-05-21 DIAGNOSIS — Z780 Asymptomatic menopausal state: Secondary | ICD-10-CM

## 2020-05-21 DIAGNOSIS — M8589 Other specified disorders of bone density and structure, multiple sites: Secondary | ICD-10-CM

## 2020-05-22 ENCOUNTER — Ambulatory Visit (INDEPENDENT_AMBULATORY_CARE_PROVIDER_SITE_OTHER): Payer: Medicare Other | Admitting: *Deleted

## 2020-05-22 ENCOUNTER — Other Ambulatory Visit: Payer: Self-pay

## 2020-05-22 ENCOUNTER — Other Ambulatory Visit: Payer: Self-pay | Admitting: Family Medicine

## 2020-05-22 DIAGNOSIS — Z23 Encounter for immunization: Secondary | ICD-10-CM

## 2020-05-22 MED ORDER — ALENDRONATE SODIUM 70 MG PO TABS: 70.0000 mg | ORAL_TABLET | ORAL | 1 refills | Status: DC

## 2020-05-22 MED ORDER — ALENDRONATE SODIUM 70 MG PO TABS: 70.0000 mg | ORAL_TABLET | ORAL | 11 refills | Status: DC

## 2020-06-01 ENCOUNTER — Other Ambulatory Visit: Payer: Self-pay | Admitting: Family Medicine

## 2020-06-01 DIAGNOSIS — I10 Essential (primary) hypertension: Secondary | ICD-10-CM

## 2020-07-02 ENCOUNTER — Other Ambulatory Visit: Payer: Self-pay | Admitting: Family Medicine

## 2020-07-02 DIAGNOSIS — I10 Essential (primary) hypertension: Secondary | ICD-10-CM

## 2020-07-17 ENCOUNTER — Other Ambulatory Visit: Payer: Self-pay | Admitting: Family Medicine

## 2020-07-17 DIAGNOSIS — E039 Hypothyroidism, unspecified: Secondary | ICD-10-CM

## 2020-07-23 DIAGNOSIS — Z23 Encounter for immunization: Secondary | ICD-10-CM | POA: Diagnosis not present

## 2020-07-25 ENCOUNTER — Ambulatory Visit: Payer: Medicare Other | Admitting: Family Medicine

## 2020-07-30 DIAGNOSIS — K648 Other hemorrhoids: Secondary | ICD-10-CM | POA: Diagnosis not present

## 2020-07-30 DIAGNOSIS — K625 Hemorrhage of anus and rectum: Secondary | ICD-10-CM | POA: Diagnosis not present

## 2020-07-30 DIAGNOSIS — R109 Unspecified abdominal pain: Secondary | ICD-10-CM | POA: Diagnosis not present

## 2020-07-30 DIAGNOSIS — K5900 Constipation, unspecified: Secondary | ICD-10-CM | POA: Diagnosis not present

## 2020-08-02 DIAGNOSIS — K625 Hemorrhage of anus and rectum: Secondary | ICD-10-CM | POA: Diagnosis not present

## 2020-08-09 ENCOUNTER — Encounter (HOSPITAL_COMMUNITY): Payer: Self-pay | Admitting: *Deleted

## 2020-08-09 ENCOUNTER — Other Ambulatory Visit: Payer: Self-pay

## 2020-08-09 ENCOUNTER — Emergency Department (HOSPITAL_COMMUNITY)
Admission: EM | Admit: 2020-08-09 | Discharge: 2020-08-09 | Disposition: A | Discharge status: Home / Self Care | Payer: Medicare Other | Attending: Emergency Medicine | Admitting: Emergency Medicine

## 2020-08-09 DIAGNOSIS — K625 Hemorrhage of anus and rectum: Secondary | ICD-10-CM | POA: Insufficient documentation

## 2020-08-09 DIAGNOSIS — R519 Headache, unspecified: Secondary | ICD-10-CM | POA: Diagnosis not present

## 2020-08-09 DIAGNOSIS — Z86010 Personal history of colonic polyps: Secondary | ICD-10-CM | POA: Insufficient documentation

## 2020-08-09 DIAGNOSIS — J45909 Unspecified asthma, uncomplicated: Secondary | ICD-10-CM | POA: Insufficient documentation

## 2020-08-09 LAB — CBC WITH DIFFERENTIAL/PLATELET
Abs Immature Granulocytes: 0.02 10*3/uL (ref 0.00–0.07)
Basophils Absolute: 0.1 10*3/uL (ref 0.0–0.1)
Basophils Relative: 1 %
Eosinophils Absolute: 0.2 10*3/uL (ref 0.0–0.5)
Eosinophils Relative: 3 %
HCT: 42.2 % (ref 36.0–46.0)
Hemoglobin: 14 g/dL (ref 12.0–15.0)
Immature Granulocytes: 0 %
Lymphocytes Relative: 9 %
Lymphs Abs: 0.8 10*3/uL (ref 0.7–4.0)
MCH: 30.3 pg (ref 26.0–34.0)
MCHC: 33.2 g/dL (ref 30.0–36.0)
MCV: 91.3 fL (ref 80.0–100.0)
Monocytes Absolute: 0.6 10*3/uL (ref 0.1–1.0)
Monocytes Relative: 7 %
Neutro Abs: 6.9 10*3/uL (ref 1.7–7.7)
Neutrophils Relative %: 80 %
Platelets: 385 10*3/uL (ref 150–400)
RBC: 4.62 MIL/uL (ref 3.87–5.11)
RDW: 13.2 % (ref 11.5–15.5)
WBC: 8.5 10*3/uL (ref 4.0–10.5)
nRBC: 0 % (ref 0.0–0.2)

## 2020-08-09 LAB — POC OCCULT BLOOD, ED: Fecal Occult Bld: NEGATIVE

## 2020-08-09 LAB — BASIC METABOLIC PANEL
Anion gap: 10 (ref 5–15)
BUN: 8 mg/dL (ref 8–23)
CO2: 22 mmol/L (ref 22–32)
Calcium: 10.2 mg/dL (ref 8.9–10.3)
Chloride: 101 mmol/L (ref 98–111)
Creatinine, Ser: 0.76 mg/dL (ref 0.44–1.00)
GFR, Estimated: >60 mL/min (ref >60)
Glucose, Bld: 104 mg/dL — ABNORMAL HIGH (ref 70–99)
Potassium: 3.7 mmol/L (ref 3.5–5.1)
Sodium: 133 mmol/L — ABNORMAL LOW (ref 135–145)

## 2020-08-09 NOTE — Discharge Instructions (Signed)
Please read the attachment on lower gastrointestinal bleeding.  I suspect that in your case it is due to an internal hemorrhoid.  Please start using the Anusol, as directed.  If you do not like the way it makes you feel, consider over-the-counter hemorrhoid ointments at your local pharmacy.  As for your intermittent right-sided abdominal discomfort, I suspect that this is due to incisional hernia from your cholecystectomy.  Please be sure to notify your primary care provider of this.  You may ultimately require referral to general surgery if it becomes more bothersome.  I have placed an ambulatory referral to gastroenterology, all of our GI, and marked as urgent.  I agree that you need to have a colonoscopy as soon as possible.  However, your laboratory work-up is reassuring.  Return to the ED or seek immediate medical attention should you experience any new or worsening symptoms.

## 2020-08-09 NOTE — ED Triage Notes (Signed)
Rectal bleeding onset 07-30-2020

## 2020-08-09 NOTE — ED Provider Notes (Signed)
[facility] EMERGENCY DEPARTMENT Provider Note   CSN: [id number] Arrival date & time: 08/09/20  1058     History Chief Complaint  Patient presents with   Rectal Bleeding    [name] is a 74 y.o. female with PMH of colon polyps who presents to the ED with complaints of rectal bleeding.  I reviewed patient's medical record and she was evaluated at [facility] urgent care in [place], [place] on 07/30/2020 and 08/02/2020 for same complaints.  She was noted to have BRBPR upon wiping after a BM.  She was prescribed Anusol and provided a referral to gastroenterology for ongoing evaluation and management.  On my examination, patient reports that she is continue to experience mild BRBPR when having bowel movements.  She denies any hematuria, pain with defecation, fevers or chills, or significant abdominal pain.  No nausea or vomiting.  She does endorse mild right-sided abdominal bulge and discomfort that is intermittent. She states that she has been experiencing these right-sided abdominal symptoms ever since she had a cholecystectomy. No abdominal discomfort on my exam.  She states that she tried using the Anusol once, but she did not like the way that it made her feel so she stopped.  She wants to be seen by [facility] who performed her colonoscopy 7 years ago, but she has not heard from anyone since receiving her referral from the urgent care and she is concerned about her ongoing mild BRBPR.  While she has a mildly elevated heart rate here in the ED while she has a mildly elevated heart rate here in the ED and is also endorsing mild headache symptoms, she states that it is because she has not had anything to eat or drink since earlier this morning.  HPI     Past Medical History:  Diagnosis Date   Allergy    seasonal   Asthma    Cataract    Colon polyps    Hyperlipidemia    Menopausal state    Osteopenia    Rhinitis    Thyroid disease    Vitamin D deficiency disease     Patient  Active Problem List   Diagnosis Date Noted   Asthma, chronic, moderate persistent, uncomplicated 01/24/2020   Essential hypertension 05/18/2019   Abnormal EKG 05/18/2019   Osteopenia 03/11/2013   Hyperlipidemia 03/11/2013   Hypothyroidism 11/22/2012   Asthma, chronic 11/22/2012    Past Surgical History:  Procedure Laterality Date   CHOLECYSTECTOMY  2005   TONSILLECTOMY       OB History   No obstetric history on file.     Family History  Problem Relation Age of Onset   Hyperlipidemia Mother    Hypertension Mother    Thyroid disease Mother    Osteoporosis Mother    Heart disease Father    Heart attack Father        heavy smoker   Hypertension Sister    Gout Sister    Hypertension Brother    Asthma Brother     Social History   Tobacco Use   Smoking status: Never Smoker   Smokeless tobacco: Never Used  Building services engineer Use: Never used  Substance Use Topics   Alcohol use: No   Drug use: No    Home Medications Prior to Admission medications   Medication Sig Start Date End Date Taking? Authorizing Provider  alendronate (FOSAMAX) 70 MG tablet Take 1 tablet (70 mg total) by mouth every 7 (seven) days. Take with a  full glass of water on an empty stomach. 05/22/20  Yes Mechele Claude, MD  amLODipine (NORVASC) 5 MG tablet Take 1 tablet by mouth once daily for blood pressure 06/01/20  Yes Stacks, Broadus John, MD  Cholecalciferol (VITAMIN D3) 3000 UNITS TABS Take 2,000 Units by mouth daily.    Yes [provider]  albuterol (VENTOLIN HFA) 108 (90 Base) MCG/ACT inhaler Inhale 2 puffs into the lungs every 6 (six) hours as needed for wheezing. 01/24/20   Mechele Claude, MD  alendronate (FOSAMAX) 70 MG tablet Take 1 tablet (70 mg total) by mouth every 7 (seven) days. Take with a full glass of water on an empty stomach. Do not lay down for at least 2 hours Patient not taking: Reported on 08/09/2020 05/22/20   Mechele Claude, MD  EUTHYROX 75 MCG tablet  Take 1 tablet by mouth once daily 07/17/20   Mechele Claude, MD  fluticasone [facility]) 50 MCG/ACT nasal spray Use 2 spray(s) in each nostril once daily 01/24/20   Mechele Claude, MD  Fluticasone-Salmeterol (ADVAIR) 100-50 MCG/DOSE AEPB INHALE 1 DOSE BY MOUTH TWICE DAILY 01/24/20   Mechele Claude, MD  lisinopril (ZESTRIL) 20 MG tablet Take 1 tablet by mouth once daily 07/02/20   Mechele Claude, MD  Multiple Vitamin (MULTIVITAMIN WITH MINERALS) TABS Take 1 tablet by mouth daily.    [provider]    Allergies    Evista [raloxifene hcl], Fosamax [alendronate sodium], Red dye, and Sulfa antibiotics  Review of Systems   Review of Systems  All other systems reviewed and are negative.   Physical Exam Updated Vital Signs BP (!) 160/75    Pulse (!) 114    Temp 98.1 F (36.7 C)    Resp 18    SpO2 100%   Physical Exam Vitals and nursing note reviewed. Exam conducted with a chaperone present.  Constitutional:      General: She is not in acute distress.    Appearance: Normal appearance. She is not ill-appearing.  HENT:     Head: Normocephalic and atraumatic.  Eyes:     General: No scleral icterus.    Conjunctiva/sclera: Conjunctivae normal.  Cardiovascular:     Rate and Rhythm: Regular rhythm. Tachycardia present.     Pulses: Normal pulses.     Heart sounds: Normal heart sounds.  Pulmonary:     Effort: Pulmonary effort is normal.  Abdominal:     General: Abdomen is flat. There is no distension.     Palpations: Abdomen is soft.     Tenderness: There is no abdominal tenderness. There is no guarding.  Genitourinary:    Comments: Normal anorectal exam.  No obvious fissures or hemorrhoids.  Digital exam unremarkable.  No BRBPR or melena.   Musculoskeletal:     Cervical back: Normal range of motion.  Skin:    General: Skin is dry.     Capillary Refill: Capillary refill takes less than 2 seconds.  Neurological:     Mental Status: She is alert and oriented to person, place, and time.      GCS: GCS eye subscore is 4. GCS verbal subscore is 5. GCS motor subscore is 6.  Psychiatric:        Mood and Affect: Mood normal.        Behavior: Behavior normal.        Thought Content: Thought content normal.     ED Results / Procedures / Treatments   Labs (all labs ordered are listed, but only abnormal results are displayed)  Labs Reviewed  BASIC METABOLIC PANEL - Abnormal; Notable for the following components:      Result Value   Sodium 133 (*)    Glucose, Bld 104 (*)    All other components within normal limits  CBC WITH DIFFERENTIAL/PLATELET  CBC WITH DIFFERENTIAL/PLATELET  POC OCCULT BLOOD, ED    EKG None  Radiology No results found.  Procedures Procedures (including critical care time)  Medications Ordered in ED Medications - No data to display  ED Course  I have reviewed the triage vital signs and the nursing notes.  Pertinent labs & imaging results that were available during my care of the patient were reviewed by me and considered in my medical decision making (see chart for details).    MDM Rules/Calculators/A&P                          Patient is in the ED for ongoing BRBPR.  She has not been using the prescribed Anusol.  She also endorses intermittent discomfort in the context of likely incisional hernia from cholecystectomy.  Will have patient follow-up with her primary care provider regarding today's encounter and for ongoing evaluation.  Will provide ambulatory referral to gastroenterology, specifically [facility] in [place] per her request and prior history.    CBC and BMP reviewed and no significant derangement.  Encourage her to continue using the Anusol as that is the medication that I would have prescribed too.  She may also use over-the-counter steroid ointment for treatment of suspected hemorrhoid if cost is a prohibiting factor.  She will notify her primary care provider of her suspected incisional hernia that is intermittently  uncomfortable.  She is overdue for her colonoscopy, suspect 12 years since last colonoscopy.    ED return precautions discussed with patient.  Patient voices understanding and is agreeable to the plan.  Final Clinical Impression(s) / ED Diagnoses Final diagnoses:  Rectal bleeding    Rx / DC Orders ED Discharge Orders         Ordered    Ambulatory referral to Gastroenterology        08/09/20 1706           Lorelee New, PA-C 08/09/20 1706    Vanetta Mulders, MD 08/10/20 [phone]

## 2020-08-15 ENCOUNTER — Ambulatory Visit: Payer: Medicare Other | Admitting: Family Medicine

## 2020-08-29 ENCOUNTER — Ambulatory Visit (INDEPENDENT_AMBULATORY_CARE_PROVIDER_SITE_OTHER): Payer: Medicare Other

## 2020-08-29 ENCOUNTER — Encounter: Payer: Self-pay | Admitting: Family Medicine

## 2020-08-29 ENCOUNTER — Ambulatory Visit (INDEPENDENT_AMBULATORY_CARE_PROVIDER_SITE_OTHER): Payer: Medicare Other | Admitting: Family Medicine

## 2020-08-29 ENCOUNTER — Other Ambulatory Visit: Payer: Self-pay

## 2020-08-29 VITALS — BP 114/64 | HR 89 | Temp 97.8°F | Ht 61.0 in | Wt 126.2 lb

## 2020-08-29 DIAGNOSIS — R102 Pelvic and perineal pain: Secondary | ICD-10-CM | POA: Diagnosis not present

## 2020-08-29 DIAGNOSIS — Z1159 Encounter for screening for other viral diseases: Secondary | ICD-10-CM

## 2020-08-29 DIAGNOSIS — I10 Essential (primary) hypertension: Secondary | ICD-10-CM | POA: Diagnosis not present

## 2020-08-29 DIAGNOSIS — R109 Unspecified abdominal pain: Secondary | ICD-10-CM | POA: Diagnosis not present

## 2020-08-29 DIAGNOSIS — E039 Hypothyroidism, unspecified: Secondary | ICD-10-CM | POA: Diagnosis not present

## 2020-08-29 DIAGNOSIS — J4540 Moderate persistent asthma, uncomplicated: Secondary | ICD-10-CM | POA: Diagnosis not present

## 2020-08-29 MED ORDER — METRONIDAZOLE 500 MG PO TABS: 500.0000 mg | ORAL_TABLET | Freq: Two times a day (BID) | ORAL | 0 refills | Status: DC

## 2020-08-29 MED ORDER — FLUTICASONE-SALMETEROL 100-50 MCG/DOSE IN AEPB
INHALATION_SPRAY | RESPIRATORY_TRACT | 3 refills | Status: DC
Start: 2020-08-29 — End: 2021-03-27

## 2020-08-29 MED ORDER — PANTOPRAZOLE SODIUM 40 MG PO TBEC: 40.0000 mg | DELAYED_RELEASE_TABLET | Freq: Every day | ORAL | 11 refills | Status: DC

## 2020-08-29 MED ORDER — CIPROFLOXACIN HCL 500 MG PO TABS: 500.0000 mg | ORAL_TABLET | Freq: Two times a day (BID) | ORAL | 0 refills | Status: DC

## 2020-08-29 MED ORDER — FLUTICASONE PROPIONATE 50 MCG/ACT NA SUSP
NASAL | 1 refills | Status: DC
Start: 2020-08-29 — End: 2022-06-11

## 2020-08-29 MED ORDER — LEVOTHYROXINE SODIUM 75 MCG PO TABS: 75.0000 ug | ORAL_TABLET | Freq: Every day | ORAL | 1 refills | Status: DC

## 2020-08-29 MED ORDER — AMLODIPINE BESYLATE 5 MG PO TABS: 5.0000 mg | ORAL_TABLET | Freq: Every day | ORAL | 1 refills | Status: DC

## 2020-08-29 MED ORDER — LISINOPRIL 20 MG PO TABS: 20.0000 mg | ORAL_TABLET | Freq: Every day | ORAL | 1 refills | Status: DC

## 2020-08-29 NOTE — Addendum Note (Signed)
Addended by: Margorie John on: 08/29/2020 01:42 PM   Modules accepted: Orders

## 2020-08-29 NOTE — Progress Notes (Signed)
Subjective:  Patient ID: [name], female    DOB: [birth year]  Age: 75 y.o. MRN: [id number]  CC: Follow-up (6 month/)   HPI [name] presents for 31-monthfollow-up but she reports that she has been having a great deal of abdominal pain she points to the hypogastric region and is describes it as crampy.  It is fairly steady.  It is moderate.  It has left her feeling weak.  She is also having 6 loose bowel movements a day.  She is passing a lot of gas and when she passes gas the stool will be green.  All of her stools are loose and grainy and green  Patient presents for follow-up on  thyroid. The patient has a history of hypothyroidism for many years. It has been stable recently. Pt. denies any change in  voice, loss of hair, heat or cold intolerance. Energy level has been poor.  Patient denies constipation. Medication is as noted below. Verified that pt is taking it daily on an empty stomach. Well tolerated. [facility] 08/29/2020 03/12/2020 01/24/2020  Decreased Interest 0 0 0  Down, Depressed, Hopeless 0 0 0  PHQ - 2 Score 0 0 0    History EArushihas a past medical history of Allergy, Asthma, Cataract, Colon polyps, Hyperlipidemia, Menopausal state, Osteopenia, Rhinitis, Thyroid disease, and Vitamin D deficiency disease.   She has a past surgical history that includes Tonsillectomy and Cholecystectomy (2005).   Her family history includes Asthma in her brother; Gout in her sister; Heart attack in her father; Heart disease in her father; Hyperlipidemia in her mother; Hypertension in her brother, mother, and sister; Osteoporosis in her mother; Thyroid disease in her mother.She reports that she has never smoked. She has never used smokeless tobacco. She reports that she does not drink alcohol and does not use drugs.    ROS Review of Systems  Constitutional: Positive for activity change and appetite change.  HENT: Negative.   Eyes: Negative for visual disturbance.   Respiratory: Negative for shortness of breath.   Cardiovascular: Negative for chest pain.  Gastrointestinal: Positive for abdominal distention, abdominal pain, blood in stool, diarrhea and nausea. Negative for vomiting.  Endocrine: Negative.   Genitourinary: Negative for dysuria.  Musculoskeletal: Negative for arthralgias.  Psychiatric/Behavioral: Positive for dysphoric mood.    Objective:  BP 114/64   Pulse 89   Temp 97.8 F (36.6 C) (Temporal)   Ht 5' 1"  (1.549 m)   Wt 126 lb 3.2 oz (57.2 kg)   BMI 23.85 kg/m   BP Readings from Last 3 Encounters:  08/29/20 114/64  08/09/20 136/77  03/12/20 135/77    Wt Readings from Last 3 Encounters:  08/29/20 126 lb 3.2 oz (57.2 kg)  08/09/20 130 lb 8.2 oz (59.2 kg)  03/12/20 130 lb 8 oz (59.2 kg)     Physical Exam Constitutional:      Appearance: She is well-developed and well-nourished.  HENT:     Head: Normocephalic and atraumatic.  Cardiovascular:     Rate and Rhythm: Normal rate and regular rhythm.     Heart sounds: No murmur heard.   Pulmonary:     Effort: Pulmonary effort is normal.     Breath sounds: Normal breath sounds.  Abdominal:     General: Bowel sounds are normal.     Palpations: Abdomen is soft. There is no mass.     Tenderness: There is abdominal tenderness (through central, lower abd). There is no guarding  or rebound.  Skin:    General: Skin is warm and dry.  Neurological:     Mental Status: She is alert and oriented to person, place, and time.  Psychiatric:        Mood and Affect: Mood and affect normal.        Behavior: Behavior normal.       Assessment & Plan:   Timi was seen today for follow-up.  Diagnoses and all orders for this visit:  Need for hepatitis C screening test -     Hepatitis C antibody  Essential hypertension -     lisinopril (ZESTRIL) 20 MG tablet; Take 1 tablet (20 mg total) by mouth daily. -     amLODipine (NORVASC) 5 MG tablet; Take 1 tablet (5 mg total) by mouth  daily. -     CBC with Differential/Platelet -     CMP14+EGFR  Asthma, chronic, moderate persistent, uncomplicated -     Fluticasone-Salmeterol (ADVAIR) 100-50 MCG/DOSE AEPB; INHALE 1 DOSE BY MOUTH TWICE DAILY -     fluticasone (FLONASE) 50 MCG/ACT nasal spray; Use 2 spray(s) in each nostril once daily  Hypothyroidism, unspecified type -     levothyroxine (EUTHYROX) 75 MCG tablet; Take 1 tablet (75 mcg total) by mouth daily.  Hypogastric pain -     CBC with Differential/Platelet -     CMP14+EGFR -     Lipase -     Fecal occult blood, imunochemical; Future -     TSH + free T4 -     DG Abd 2 Views; Future  Other orders -     ciprofloxacin (CIPRO) 500 MG tablet; Take 1 tablet (500 mg total) by mouth 2 (two) times daily. -     metroNIDAZOLE (FLAGYL) 500 MG tablet; Take 1 tablet (500 mg total) by mouth 2 (two) times daily. -     pantoprazole (PROTONIX) 40 MG tablet; Take 1 tablet (40 mg total) by mouth daily. For stomach       I have changed Derica P. Cammon "Patricia"'s Euthyrox to levothyroxine. I have also changed her lisinopril and amLODipine. I am also having her start on ciprofloxacin, metroNIDAZOLE, and pantoprazole. Additionally, I am having her maintain her multivitamin with minerals, Vitamin D3, albuterol, alendronate, alendronate, dicyclomine, hydrocortisone, Fluticasone-Salmeterol, and fluticasone.  Allergies as of 08/29/2020      Reactions   Evista [raloxifene Hcl] Other (See Comments)   Bone pain, couldn't put weight on right leg/foot   Fosamax [alendronate Sodium]    Bone pain   Red Dye    Sulfa Antibiotics       Medication List       Accurate as of August 29, 2020  9:34 AM. If you have any questions, ask your nurse or doctor.        albuterol 108 (90 Base) MCG/ACT inhaler Commonly known as: VENTOLIN HFA Inhale 2 puffs into the lungs every 6 (six) hours as needed for wheezing.   alendronate 70 MG tablet Commonly known as: FOSAMAX Take 1 tablet (70 mg total)  by mouth every 7 (seven) days. Take with a full glass of water on an empty stomach. Do not lay down for at least 2 hours   alendronate 70 MG tablet Commonly known as: FOSAMAX Take 1 tablet (70 mg total) by mouth every 7 (seven) days. Take with a full glass of water on an empty stomach.   amLODipine 5 MG tablet Commonly known as: NORVASC Take 1 tablet (5 mg total) by mouth  daily. What changed: See the new instructions. Changed by: [name], MD   ciprofloxacin 500 MG tablet Commonly known as: Cipro Take 1 tablet (500 mg total) by mouth 2 (two) times daily. Started by: [name], MD   dicyclomine 10 MG capsule Commonly known as: BENTYL Take one po qid prn abdominal spasms   fluticasone 50 MCG/ACT nasal spray Commonly known as: FLONASE Use 2 spray(s) in each nostril once daily   Fluticasone-Salmeterol 100-50 MCG/DOSE Aepb Commonly known as: ADVAIR INHALE 1 DOSE BY MOUTH TWICE DAILY   hydrocortisone 25 MG suppository Commonly known as: ANUSOL-HC Place rectally.   levothyroxine 75 MCG tablet Commonly known as: Euthyrox Take 1 tablet (75 mcg total) by mouth daily. What changed: how much to take Changed by: [name], MD   lisinopril 20 MG tablet Commonly known as: ZESTRIL Take 1 tablet (20 mg total) by mouth daily.   metroNIDAZOLE 500 MG tablet Commonly known as: FLAGYL Take 1 tablet (500 mg total) by mouth 2 (two) times daily. Started by: [name], MD   multivitamin with minerals Tabs tablet Take 1 tablet by mouth daily.   pantoprazole 40 MG tablet Commonly known as: PROTONIX Take 1 tablet (40 mg total) by mouth daily. For stomach Started by: [name], MD   Vitamin D3 75 MCG (3000 UT) Tabs Take 2,000 Units by mouth daily.        Follow-up: Return in about 2 weeks (around 09/12/2020), or if symptoms worsen or fail to improve, for abdominal pain and diarrhea.  [name], M.D.

## 2020-08-30 LAB — CBC WITH DIFFERENTIAL/PLATELET
Basophils Absolute: 0.1 10*3/uL (ref 0.0–0.2)
Basos: 1 %
EOS (ABSOLUTE): 0.3 10*3/uL (ref 0.0–0.4)
Eos: 4 %
Hematocrit: 40.8 % (ref 34.0–46.6)
Hemoglobin: 13.2 g/dL (ref 11.1–15.9)
Immature Grans (Abs): 0 10*3/uL (ref 0.0–0.1)
Immature Granulocytes: 0 %
Lymphocytes Absolute: 0.6 10*3/uL — ABNORMAL LOW (ref 0.7–3.1)
Lymphs: 11 %
MCH: 29.1 pg (ref 26.6–33.0)
MCHC: 32.4 g/dL (ref 31.5–35.7)
MCV: 90 fL (ref 79–97)
Monocytes Absolute: 0.7 10*3/uL (ref 0.1–0.9)
Monocytes: 13 %
Neutrophils Absolute: 4.2 10*3/uL (ref 1.4–7.0)
Neutrophils: 71 %
Platelets: 481 10*3/uL — ABNORMAL HIGH (ref 150–450)
RBC: 4.53 x10E6/uL (ref 3.77–5.28)
RDW: 12.5 % (ref 11.7–15.4)
WBC: 5.9 10*3/uL (ref 3.4–10.8)

## 2020-08-30 LAB — FECAL OCCULT BLOOD, IMMUNOCHEMICAL: Fecal Occult Bld: POSITIVE — AB

## 2020-08-30 LAB — CMP14+EGFR
ALT: 9 IU/L (ref 0–32)
AST: 11 IU/L (ref 0–40)
Albumin/Globulin Ratio: 1.1 — ABNORMAL LOW (ref 1.2–2.2)
Albumin: 3.4 g/dL — ABNORMAL LOW (ref 3.7–4.7)
Alkaline Phosphatase: 79 IU/L (ref 44–121)
BUN/Creatinine Ratio: 5 — ABNORMAL LOW (ref 12–28)
BUN: 5 mg/dL — ABNORMAL LOW (ref 8–27)
Bilirubin Total: 0.5 mg/dL (ref 0.0–1.2)
CO2: 24 mmol/L (ref 20–29)
Calcium: 9.9 mg/dL (ref 8.7–10.3)
Chloride: 100 mmol/L (ref 96–106)
Creatinine, Ser: 0.95 mg/dL (ref 0.57–1.00)
GFR calc Af Amer: 67 mL/min/{1.73_m2} (ref >59)
GFR calc non Af Amer: 58 mL/min/{1.73_m2} — ABNORMAL LOW (ref >59)
Globulin, Total: 3 g/dL (ref 1.5–4.5)
Glucose: 92 mg/dL (ref 65–99)
Potassium: 4.9 mmol/L (ref 3.5–5.2)
Sodium: 136 mmol/L (ref 134–144)
Total Protein: 6.4 g/dL (ref 6.0–8.5)

## 2020-08-30 LAB — LIPASE: Lipase: 16 U/L (ref 14–85)

## 2020-08-30 LAB — HEPATITIS C ANTIBODY: Hep C Virus Ab: <0.1 s/co ratio (ref 0.0–0.9)

## 2020-08-30 LAB — TSH+FREE T4
Free T4: 1.7 ng/dL (ref 0.82–1.77)
TSH: 0.127 u[IU]/mL — ABNORMAL LOW (ref 0.450–4.500)

## 2020-08-30 NOTE — Progress Notes (Signed)
Hello Veona,  Your lab result is normal and/or stable.Some minor variations that are not significant are commonly marked abnormal, but do not represent any medical problem for you.  Best regards, Monty Mccarrell, M.D.

## 2020-08-31 ENCOUNTER — Ambulatory Visit (INDEPENDENT_AMBULATORY_CARE_PROVIDER_SITE_OTHER): Payer: Medicare Other | Admitting: *Deleted

## 2020-08-31 DIAGNOSIS — Z0000 Encounter for general adult medical examination without abnormal findings: Secondary | ICD-10-CM

## 2020-08-31 NOTE — Progress Notes (Signed)
MEDICARE ANNUAL WELLNESS VISIT  08/31/2020  Telephone Visit Disclaimer This Medicare AWV was conducted by telephone due to national recommendations for restrictions regarding the COVID-19 Pandemic (e.g. social distancing).  I verified, using two identifiers, that I am speaking with [name] or their authorized healthcare agent. I discussed the limitations, risks, security, and privacy concerns of performing an evaluation and management service by telephone and the potential availability of an in-person appointment in the future. The patient expressed understanding and agreed to proceed.  Location of Patient: Home Location of Provider (nurse):  [facility]  Subjective:    [name] is a 75 y.o. female patient of Stacks, Cletus Gash, MD who had a Medicare Annual Wellness Visit today via telephone. [name] is Retired and lives alone. she has 0 children. she reports that she is socially active and does interact with friends/family regularly. she is not physically active and enjoys reading and crossword puzzles.  Patient Care Team: [name], MD as PCP - General (Family Medicine) Herminio Commons, MD (Inactive) as PCP - Cardiology (Cardiology) Clent Jacks, MD as Consulting Physician (Ophthalmology) Gatha Mayer, MD as Consulting Physician (Gastroenterology)  Advanced Directives 08/31/2020 08/09/2020 03/10/2019 10/26/2017 07/02/2016 03/09/2015  Does Patient Have a Medical Advance Directive? No No No No No No  Would patient like information on creating a medical advance directive? No - Patient declined - Yes (MAU/Ambulatory/Procedural Areas - Information given) No - Patient declined Yes - Scientist, clinical (histocompatibility and immunogenetics) given Yes - Educational materials given    Hospital Utilization Over the Past 12 Months: # of hospitalizations or ER visits: 1-ER visit # of surgeries: 0  Review of Systems    Patient reports that her overall health is unchanged compared to last  year.  History obtained from chart review and the patient  Patient Reported Readings (BP, Pulse, CBG, Weight, etc) none  Pain Assessment Pain : No/denies pain     Current Medications & Allergies (verified) Allergies as of 08/31/2020      Reactions   Evista [raloxifene Hcl] Other (See Comments)   Bone pain, couldn't put weight on right leg/foot   Fosamax [alendronate Sodium]    Bone pain   Red Dye    Sulfa Antibiotics       Medication List       Accurate as of August 31, 2020  8:43 AM. If you have any questions, ask your nurse or doctor.        albuterol 108 (90 Base) MCG/ACT inhaler Commonly known as: VENTOLIN HFA Inhale 2 puffs into the lungs every 6 (six) hours as needed for wheezing.   alendronate 70 MG tablet Commonly known as: FOSAMAX Take 1 tablet (70 mg total) by mouth every 7 (seven) days. Take with a full glass of water on an empty stomach.   amLODipine 5 MG tablet Commonly known as: NORVASC Take 1 tablet (5 mg total) by mouth daily.   ciprofloxacin 500 MG tablet Commonly known as: Cipro Take 1 tablet (500 mg total) by mouth 2 (two) times daily.   dicyclomine 10 MG capsule Commonly known as: BENTYL   fluticasone 50 MCG/ACT nasal spray Commonly known as: FLONASE Use 2 spray(s) in each nostril once daily   Fluticasone-Salmeterol 100-50 MCG/DOSE Aepb Commonly known as: ADVAIR INHALE 1 DOSE BY MOUTH TWICE DAILY   hydrocortisone 25 MG suppository Commonly known as: ANUSOL-HC Place rectally.   levothyroxine 75 MCG tablet Commonly known as: Euthyrox Take 1 tablet (75 mcg total) by  mouth daily.   lisinopril 20 MG tablet Commonly known as: ZESTRIL Take 1 tablet (20 mg total) by mouth daily.   metroNIDAZOLE 500 MG tablet Commonly known as: FLAGYL Take 1 tablet (500 mg total) by mouth 2 (two) times daily.   multivitamin with minerals Tabs tablet Take 1 tablet by mouth daily.   pantoprazole 40 MG tablet Commonly known as: PROTONIX Take 1 tablet  (40 mg total) by mouth daily. For stomach   Vitamin D3 75 MCG (3000 UT) Tabs Take 2,000 Units by mouth daily.       History (reviewed): Past Medical History:  Diagnosis Date  . Allergy    seasonal  . Asthma   . Cataract   . Colon polyps   . Hyperlipidemia   . Menopausal state   . Osteopenia   . Rhinitis   . Thyroid disease   . Vitamin D deficiency disease    Past Surgical History:  Procedure Laterality Date  . CHOLECYSTECTOMY  2005  . TONSILLECTOMY     Family History  Problem Relation Age of Onset  . Hyperlipidemia Mother   . Hypertension Mother   . Thyroid disease Mother   . Osteoporosis Mother   . Heart disease Father   . Heart attack Father        heavy smoker  . Hypertension Sister   . Gout Sister   . Hypertension Brother   . Asthma Brother    Social History   Socioeconomic History  . Marital status: Single    Spouse name: Not on file  . Number of children: 0  . Years of education: 35  . Highest education level: 12th grade  Occupational History  . 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 Smoking status: Never Smoker  . Smokeless tobacco: Never Used  Vaping Use  . Vaping Use: Never used  Substance and Sexual Activity  . Alcohol use: No  . Drug use: No  . Sexual activity: Never  Other Topics Concern  . Not on file  Social History Narrative   Lives at home with her mother. Never married and no children. Retired from [employer] in the [employer].    Social Determinants of Health   Financial Resource Strain: Not on file  Food Insecurity: Not on file  Transportation Needs: Not on file  Physical Activity: Not on file  Stress: Not on file  Social Connections: Not on file    Activities of Daily Living In your present state of health, do you have any difficulty performing the following activities: 08/31/2020  Hearing? N  Vision? N  Comment Wears glasses   Difficulty concentrating or making decisions? N  Walking or climbing stairs? N  Dressing or bathing? N  Doing errands, shopping? N  Preparing Food and eating ? N  Using the Toilet? N  In the past six months, have you accidently leaked urine? N  Do you have problems with loss of bowel control? N  Managing your Medications? N  Managing your Finances? N  Housekeeping or managing your Housekeeping? N  Some recent data might be hidden    Patient Education/ Literacy How often do you need to have someone help you when you read instructions, pamphlets, or other written materials from your doctor or pharmacy?: 1 - Never What is the last grade level you completed in school?: 12th  Exercise Current Exercise Habits: The patient does not participate in regular exercise  at present, Exercise limited by: None identified  Diet Patient reports consuming 3 meals a day and 1 snack(s) a day Patient reports that her primary diet is: Regular Patient reports that she does have regular access to food.   Depression Screen PHQ 2/9 Scores 08/31/2020 08/29/2020 03/12/2020 01/24/2020 07/25/2019 06/20/2019 06/03/2019  PHQ - 2 Score 0 0 0 0 0 0 0     Fall Risk Fall Risk  08/31/2020 08/29/2020 03/12/2020 01/24/2020 07/25/2019  Falls in the past year? 0 0 0 0 0  Number falls in past yr: - 0 - 0 -  Injury with Fall? - 0 - 0 -  Risk for fall due to : - No Fall Risks - No Fall Risks -  Follow up - Falls evaluation completed Falls evaluation completed Falls evaluation completed -     Objective:  [name] seemed alert and oriented and she participated appropriately during our telephone visit.  Blood Pressure Weight BMI  BP Readings from Last 3 Encounters:  08/29/20 114/64  08/09/20 136/77  03/12/20 135/77   Wt Readings from Last 3 Encounters:  08/29/20 126 lb 3.2 oz (57.2 kg)  08/09/20 130 lb 8.2 oz (59.2 kg)  03/12/20 130 lb 8 oz (59.2 kg)   BMI Readings from Last 1 Encounters:  08/29/20 23.85 kg/m     *Unable to obtain current vital signs, weight, and BMI due to telephone visit type  Hearing/Vision  . Zaira did not seem to have difficulty with hearing/understanding during the telephone conversation . Reports that she has not had a formal eye exam by an eye care professional within the past year . Reports that she has not had a formal hearing evaluation within the past year *Unable to fully assess hearing and vision during telephone visit type  Cognitive Function: 6CIT Screen 08/31/2020 03/10/2019  What Year? 0 points 0 points  What month? 0 points 0 points  What time? 0 points 0 points  Count back from 20 0 points 0 points  Months in reverse 0 points 0 points  Repeat phrase 2 points 0 points  Total Score 2 0   (Normal:0-7, Significant for Dysfunction: >8)  Normal Cognitive Function Screening: Yes   Immunization & Health Maintenance Record Immunization History  Administered Date(s) Administered  . Fluad Quad(high Dose 65+) 06/03/2019, 05/22/2020  . Influenza, High Dose Seasonal PF 06/16/2017, 06/04/2018  . Influenza,inj,Quad PF,6+ Mos 06/10/2013, 06/05/2014, 06/05/2015, 05/26/2016  . PFIZER SARS-COV-2 Vaccination 11/30/2019, 12/23/2019, 07/23/2020  . Pneumococcal Conjugate-13 12/26/2014  . Pneumococcal Polysaccharide-23 11/22/2012  . Td 09/30/2007  . Tdap 12/23/2017    Health Maintenance  Topic Date Due  . COLONOSCOPY (Pts 45-50yrs Insurance coverage will need to be confirmed)  10/20/2012  . MAMMOGRAM  08/08/2020  . DEXA SCAN  05/21/2022  . TETANUS/TDAP  12/24/2027  . INFLUENZA VACCINE  Completed  . COVID-19 Vaccine  Completed  . Hepatitis C Screening  Completed  . PNA vac Low Risk Adult  Completed       Assessment  This is a routine wellness examination for [employer].  Health Maintenance: Due or Overdue Health Maintenance Due  Topic Date Due  . COLONOSCOPY (Pts 45-11yrs Insurance coverage will need to be confirmed)  10/20/2012  . MAMMOGRAM  08/08/2020     [name] does not need a referral for Community Assistance: Care Management:   no Social Work:    no Prescription Assistance:  no Nutrition/Diabetes Education:  no   Plan:  Personalized Goals Goals  Addressed            This Visit's Progress   . AWV       08/31/2020 AWV Goal: Exercise for General Health   Patient will verbalize understanding of the benefits of increased physical activity:  Exercising regularly is important. It will improve your overall fitness, flexibility, and endurance.  Regular exercise also will improve your overall health. It can help you control your weight, reduce stress, and improve your bone density.  Over the next year, patient will increase physical activity as tolerated with a goal of at least 150 minutes of moderate physical activity per week.   You can tell that you are exercising at a moderate intensity if your heart starts beating faster and you start breathing faster but can still hold a conversation.  Moderate-intensity exercise ideas include:  Walking 1 mile (1.6 km) in about 15 minutes  Biking  Hiking  Golfing  Dancing  Water aerobics  Patient will verbalize understanding of everyday activities that increase physical activity by providing examples like the following: ? Yard work, such as: ? Pushing a Surveyor, mining ? Raking and bagging leaves ? Washing your car ? Pushing a stroller ? Shoveling snow ? Gardening ? Washing windows or floors  Patient will be able to explain general safety guidelines for exercising:   Before you start a new exercise program, talk with your health care provider.  Do not exercise so much that you hurt yourself, feel dizzy, or get very short of breath.  Wear comfortable clothes and wear shoes with good support.  Drink plenty of water while you exercise to prevent dehydration or heat stroke.  Work out until your breathing and your heartbeat get faster.       Personalized Health  Maintenance & Screening Recommendations  Screening mammography Colorectal cancer screening  Lung Cancer Screening Recommended: no (Low Dose CT Chest recommended if Age 75-80 years, 30 pack-year currently smoking OR have quit w/in past 15 years) Hepatitis C Screening recommended: no HIV Screening recommended: no  Advanced Directives: Written information was not prepared per patient's request.  Referrals & Orders No orders of the defined types were placed in this encounter.   Follow-up Plan . Follow-up with Mechele Claude, MD as planned . Keep appointment for colonoscopy . Mammogram appointment scheduled . Schedule yearly eye exam AVS printed and mailed to patient    I have personally reviewed and noted the following in the patient's chart:   . Medical and social history . Use of alcohol, tobacco or illicit drugs  . Current medications and supplements . Functional ability and status . Nutritional status . Physical activity . Advanced directives . List of other physicians . Hospitalizations, surgeries, and ER visits in previous 12 months . Vitals . Screenings to include cognitive, depression, and falls . Referrals and appointments  In addition, I have reviewed and discussed with [name] certain preventive protocols, quality metrics, and best practice recommendations. A written personalized care plan for preventive services as well as general preventive health recommendations is available and can be mailed to the patient at her request.      Billee Cashing, LPN  01/24/5637

## 2020-08-31 NOTE — Patient Instructions (Signed)
  MEDICARE ANNUAL WELLNESS VISIT Health Maintenance Summary and Written Plan of Care  [name] ,  Thank you for allowing me to perform your Medicare Annual Wellness Visit and for your ongoing commitment to your health.   Health Maintenance & Immunization History Health Maintenance  Topic Date Due  . COLONOSCOPY (Pts 45-10yrs Insurance coverage will need to be confirmed)  10/20/2012  . MAMMOGRAM  08/08/2020  . DEXA SCAN  05/21/2022  . TETANUS/TDAP  12/24/2027  . INFLUENZA VACCINE  Completed  . COVID-19 Vaccine  Completed  . Hepatitis C Screening  Completed  . PNA vac Low Risk Adult  Completed   Immunization History  Administered Date(s) Administered  . Fluad Quad(high Dose 65+) 06/03/2019, 05/22/2020  . Influenza, High Dose Seasonal PF 06/16/2017, 06/04/2018  . Influenza,inj,Quad PF,6+ Mos 06/10/2013, 06/05/2014, 06/05/2015, 05/26/2016  . PFIZER SARS-COV-2 Vaccination 11/30/2019, 12/23/2019, 07/23/2020  . Pneumococcal Conjugate-13 12/26/2014  . Pneumococcal Polysaccharide-23 11/22/2012  . Td 09/30/2007  . Tdap 12/23/2017    These are the patient goals that we discussed: Goals Addressed            This Visit's Progress   . AWV       08/31/2020 AWV Goal: Exercise for General Health   Patient will verbalize understanding of the benefits of increased physical activity:  Exercising regularly is important. It will improve your overall fitness, flexibility, and endurance.  Regular exercise also will improve your overall health. It can help you control your weight, reduce stress, and improve your bone density.  Over the next year, patient will increase physical activity as tolerated with a goal of at least 150 minutes of moderate physical activity per week.   You can tell that you are exercising at a moderate intensity if your heart starts beating faster and you start breathing faster but can still hold a conversation.  Moderate-intensity exercise ideas include:  Walking  1 mile (1.6 km) in about 15 minutes  Biking  Hiking  Golfing  Dancing  Water aerobics  Patient will verbalize understanding of everyday activities that increase physical activity by providing examples like the following: ? Yard work, such as: ? Pushing a Surveyor, mining ? Raking and bagging leaves ? Washing your car ? Pushing a stroller ? Shoveling snow ? Gardening ? Washing windows or floors  Patient will be able to explain general safety guidelines for exercising:   Before you start a new exercise program, talk with your health care provider.  Do not exercise so much that you hurt yourself, feel dizzy, or get very short of breath.  Wear comfortable clothes and wear shoes with good support.  Drink plenty of water while you exercise to prevent dehydration or heat stroke.  Work out until your breathing and your heartbeat get faster.         This is a list of Health Maintenance Items that are overdue or due now: Health Maintenance Due  Topic Date Due  . COLONOSCOPY (Pts 45-27yrs Insurance coverage will need to be confirmed)  10/20/2012  . MAMMOGRAM  08/08/2020     Orders/Referrals Placed Today: No orders of the defined types were placed in this encounter.  (Contact our referral department at [phone] if you have not spoken with someone about your referral appointment within the next 5 days)    Follow-up Plan . Follow-up with Mechele Claude, MD as planned . Keep appointment for colonoscopy . Mammogram appointment scheduled . Schedule yearly eye exam

## 2020-09-12 ENCOUNTER — Other Ambulatory Visit: Payer: Self-pay | Admitting: Family Medicine

## 2020-09-12 DIAGNOSIS — Z1231 Encounter for screening mammogram for malignant neoplasm of breast: Secondary | ICD-10-CM

## 2020-09-13 ENCOUNTER — Telehealth: Payer: Self-pay

## 2020-09-13 NOTE — Telephone Encounter (Signed)
Patient notified of results - sent copy to GI doctor - patient has appt 09/25/2020

## 2020-09-13 NOTE — Telephone Encounter (Signed)
Called and lmtcb.

## 2020-09-24 ENCOUNTER — Ambulatory Visit (INDEPENDENT_AMBULATORY_CARE_PROVIDER_SITE_OTHER): Payer: Medicare Other | Admitting: Family Medicine

## 2020-09-24 ENCOUNTER — Other Ambulatory Visit: Payer: Self-pay

## 2020-09-24 ENCOUNTER — Ambulatory Visit (INDEPENDENT_AMBULATORY_CARE_PROVIDER_SITE_OTHER): Payer: Medicare Other

## 2020-09-24 ENCOUNTER — Encounter: Payer: Self-pay | Admitting: Family Medicine

## 2020-09-24 VITALS — BP 132/82 | HR 87 | Temp 98.2°F | Ht 61.0 in | Wt 122.4 lb

## 2020-09-24 DIAGNOSIS — R102 Pelvic and perineal pain: Secondary | ICD-10-CM

## 2020-09-24 DIAGNOSIS — R109 Unspecified abdominal pain: Secondary | ICD-10-CM | POA: Diagnosis not present

## 2020-09-24 DIAGNOSIS — R1084 Generalized abdominal pain: Secondary | ICD-10-CM | POA: Diagnosis not present

## 2020-09-24 LAB — MICROSCOPIC EXAMINATION
Bacteria, UA: NONE SEEN
Epithelial Cells (non renal): NONE SEEN /hpf (ref 0–10)
RBC, Urine: NONE SEEN /hpf (ref 0–2)
WBC, UA: NONE SEEN /hpf (ref 0–5)

## 2020-09-24 LAB — URINALYSIS, COMPLETE
Bilirubin, UA: NEGATIVE
Glucose, UA: NEGATIVE
Ketones, UA: NEGATIVE
Leukocytes,UA: NEGATIVE
Nitrite, UA: NEGATIVE
Protein,UA: NEGATIVE
Specific Gravity, UA: 1.015 (ref 1.005–1.030)
Urobilinogen, Ur: 0.2 mg/dL (ref 0.2–1.0)
pH, UA: 6 (ref 5.0–7.5)

## 2020-09-24 NOTE — Progress Notes (Signed)
Subjective:  Patient ID: [name], female    DOB: 23-May-1946  Age: 75 y.o. MRN: [id number]  CC: 2 week recheck   HPI [name] presents for recheck of her abdominal pain and constipation.  She has been treated for diverticulitis as well.  Today she tells me she is feeling much better.  The MiraLAX really helps.  She still has a fair amount of gas.  The pain is resolved.  She notes that she had some fresh red blood after a sharp pain with her bowel movement yesterday.  No pain otherwise.  And no blood since she was here but last time without 1 exception.  She is been.ing seen at the [facility] later this week and has a colonoscopy with been scheduled for 2 weeks from then.  Depression screen [facility] 2/9 09/24/2020 08/31/2020 08/29/2020  Decreased Interest 0 0 0  Down, Depressed, Hopeless 0 0 0  PHQ - 2 Score 0 0 0    History Alycea has a past medical history of Allergy, Asthma, Cataract, Colon polyps, Hyperlipidemia, Menopausal state, Osteopenia, Rhinitis, Thyroid disease, and Vitamin D deficiency disease.   She has a past surgical history that includes Tonsillectomy and Cholecystectomy (2005).   Her family history includes Asthma in her brother; Gout in her sister; Heart attack in her father; Heart disease in her father; Hyperlipidemia in her mother; Hypertension in her brother, mother, and sister; Osteoporosis in her mother; Thyroid disease in her mother.She reports that she has never smoked. She has never used smokeless tobacco. She reports that she does not drink alcohol and does not use drugs.    ROS Review of Systems  Constitutional: Negative.   HENT: Negative.   Eyes: Negative for visual disturbance.  Respiratory: Negative for shortness of breath.   Cardiovascular: Negative for chest pain.  Gastrointestinal: Negative for abdominal pain.  Musculoskeletal: Negative for arthralgias.    Objective:  BP 132/82   Pulse 87   Temp 98.2 F (36.8 C) (Temporal)   Ht 5' 1"  (1.549 m)    Wt 122 lb 6.4 oz (55.5 kg)   BMI 23.13 kg/m   BP Readings from Last 3 Encounters:  09/24/20 132/82  08/29/20 114/64  08/09/20 136/77    Wt Readings from Last 3 Encounters:  09/24/20 122 lb 6.4 oz (55.5 kg)  08/29/20 126 lb 3.2 oz (57.2 kg)  08/09/20 130 lb 8.2 oz (59.2 kg)     Physical Exam Constitutional:      General: She is not in acute distress.    Appearance: She is well-developed.  HENT:     Head: Normocephalic and atraumatic.  Eyes:     Conjunctiva/sclera: Conjunctivae normal.     Pupils: Pupils are equal, round, and reactive to light.  Neck:     Thyroid: No thyromegaly.  Cardiovascular:     Rate and Rhythm: Normal rate and regular rhythm.     Heart sounds: Normal heart sounds. No murmur heard.   Pulmonary:     Effort: Pulmonary effort is normal. No respiratory distress.     Breath sounds: Normal breath sounds. No wheezing or rales.  Abdominal:     General: Bowel sounds are normal. There is distension (mild).     Palpations: Abdomen is soft.     Tenderness: There is no abdominal tenderness.  Musculoskeletal:        General: Normal range of motion.     Cervical back: Normal range of motion and neck supple.  Lymphadenopathy:  Cervical: No cervical adenopathy.  Skin:    General: Skin is warm and dry.  Neurological:     Mental Status: She is alert and oriented to person, place, and time.  Psychiatric:        Behavior: Behavior normal.        Thought Content: Thought content normal.        Judgment: Judgment normal.   XR- NS bowel gas  Results for orders placed or performed in visit on 08/29/20  Fecal occult blood, imunochemical   Specimen: Stool   ST  Result Value Ref Range   Fecal Occult Bld Positive (A) Negative  Hepatitis C antibody  Result Value Ref Range   Hep C Virus Ab <0.1 0.0 - 0.9 s/co ratio  CBC with Differential/Platelet  Result Value Ref Range   WBC 5.9 3.4 - 10.8 x10E3/uL   RBC 4.53 3.77 - 5.28 x10E6/uL   Hemoglobin 13.2 11.1  - 15.9 g/dL   Hematocrit 40.8 34.0 - 46.6 %   MCV 90 79 - 97 fL   MCH 29.1 26.6 - 33.0 pg   MCHC 32.4 31.5 - 35.7 g/dL   RDW 12.5 11.7 - 15.4 %   Platelets 481 (H) 150 - 450 x10E3/uL   Neutrophils 71 Not Estab. %   Lymphs 11 Not Estab. %   Monocytes 13 Not Estab. %   Eos 4 Not Estab. %   Basos 1 Not Estab. %   Neutrophils Absolute 4.2 1.4 - 7.0 x10E3/uL   Lymphocytes Absolute 0.6 (L) 0.7 - 3.1 x10E3/uL   Monocytes Absolute 0.7 0.1 - 0.9 x10E3/uL   EOS (ABSOLUTE) 0.3 0.0 - 0.4 x10E3/uL   Basophils Absolute 0.1 0.0 - 0.2 x10E3/uL   Immature Granulocytes 0 Not Estab. %   Immature Grans (Abs) 0.0 0.0 - 0.1 x10E3/uL  CMP14+EGFR  Result Value Ref Range   Glucose 92 65 - 99 mg/dL   BUN 5 (L) 8 - 27 mg/dL   Creatinine, Ser 0.95 0.57 - 1.00 mg/dL   GFR calc non Af Amer 58 (L) >59 mL/min/1.73   GFR calc Af Amer 67 >59 mL/min/1.73   BUN/Creatinine Ratio 5 (L) 12 - 28   Sodium 136 134 - 144 mmol/L   Potassium 4.9 3.5 - 5.2 mmol/L   Chloride 100 96 - 106 mmol/L   CO2 24 20 - 29 mmol/L   Calcium 9.9 8.7 - 10.3 mg/dL   Total Protein 6.4 6.0 - 8.5 g/dL   Albumin 3.4 (L) 3.7 - 4.7 g/dL   Globulin, Total 3.0 1.5 - 4.5 g/dL   Albumin/Globulin Ratio 1.1 (L) 1.2 - 2.2   Bilirubin Total 0.5 0.0 - 1.2 mg/dL   Alkaline Phosphatase 79 44 - 121 IU/L   AST 11 0 - 40 IU/L   ALT 9 0 - 32 IU/L  Lipase  Result Value Ref Range   Lipase 16 14 - 85 U/L  TSH + free T4  Result Value Ref Range   TSH 0.127 (L) 0.450 - 4.500 uIU/mL   Free T4 1.70 0.82 - 1.77 ng/dL       Assessment & Plan:   [name] was seen today for 2 week recheck.  Diagnoses and all orders for this visit:  Hypogastric pain -     Urinalysis, Complete -     DG Abd 1 View; Future -     Urine Culture; Future  Generalized abdominal pain -     Urinalysis, Complete -     DG Abd  1 View; Future -     Urine Culture; Future    Treated as diverticulitis and constipation with resolution. Has colonoscopy for definitive dx later this  month.   I have discontinued Delonna P. Grudzien "Patricia"'s dicyclomine, ciprofloxacin, and metroNIDAZOLE. I am also having her maintain her multivitamin with minerals, Vitamin D3, albuterol, alendronate, hydrocortisone, lisinopril, Fluticasone-Salmeterol, fluticasone, levothyroxine, amLODipine, and pantoprazole.  Allergies as of 09/24/2020      Reactions   Evista [raloxifene Hcl] Other (See Comments)   Bone pain, couldn't put weight on right leg/foot   Fosamax [alendronate Sodium]    Bone pain   Red Dye    Sulfa Antibiotics       Medication List       Accurate as of September 24, 2020  9:36 AM. If you have any questions, ask your nurse or doctor.        STOP taking these medications   ciprofloxacin 500 MG tablet Commonly known as: Cipro Stopped by: [name], MD   dicyclomine 10 MG capsule Commonly known as: BENTYL Stopped by: [name], MD   metroNIDAZOLE 500 MG tablet Commonly known as: FLAGYL Stopped by: [name], MD     TAKE these medications   albuterol 108 (90 Base) MCG/ACT inhaler Commonly known as: VENTOLIN HFA Inhale 2 puffs into the lungs every 6 (six) hours as needed for wheezing.   alendronate 70 MG tablet Commonly known as: FOSAMAX Take 1 tablet (70 mg total) by mouth every 7 (seven) days. Take with a full glass of water on an empty stomach.   amLODipine 5 MG tablet Commonly known as: NORVASC Take 1 tablet (5 mg total) by mouth daily.   fluticasone 50 MCG/ACT nasal spray Commonly known as: FLONASE Use 2 spray(s) in each nostril once daily   Fluticasone-Salmeterol 100-50 MCG/DOSE Aepb Commonly known as: ADVAIR INHALE 1 DOSE BY MOUTH TWICE DAILY   hydrocortisone 25 MG suppository Commonly known as: ANUSOL-HC Place rectally.   levothyroxine 75 MCG tablet Commonly known as: Euthyrox Take 1 tablet (75 mcg total) by mouth daily.   lisinopril 20 MG tablet Commonly known as: ZESTRIL Take 1 tablet (20 mg total) by mouth daily.    multivitamin with minerals Tabs tablet Take 1 tablet by mouth daily.   pantoprazole 40 MG tablet Commonly known as: PROTONIX Take 1 tablet (40 mg total) by mouth daily. For stomach   Vitamin D3 75 MCG (3000 UT) Tabs Take 2,000 Units by mouth daily.        Follow-up: No follow-ups on file.  [name], M.D.

## 2020-09-25 ENCOUNTER — Ambulatory Visit (AMBULATORY_SURGERY_CENTER): Payer: Self-pay

## 2020-09-25 VITALS — Ht 61.0 in | Wt 124.0 lb

## 2020-09-25 DIAGNOSIS — Z86010 Personal history of colonic polyps: Secondary | ICD-10-CM

## 2020-09-25 NOTE — Progress Notes (Signed)
No egg or soy allergy known to patient  No issues with past sedation with any surgeries or procedures No intubation problems in the past  No FH of Malignant Hyperthermia No diet pills per patient No home 02 use per patient  No blood thinners per patient  Pt reports issues with constipation in the past No A fib or A flutter  COVID 19 guidelines implemented in [facility] today with Pt and RN  Pt is fully vaccinated  for Covid x 2+ booster; NO PA's for preps discussed with pt in PV today  Due to the COVID-19 pandemic we are asking patients to follow certain guidelines.  Pt aware of COVID protocols and [facility] guidelines

## 2020-10-09 ENCOUNTER — Other Ambulatory Visit: Payer: Self-pay

## 2020-10-09 ENCOUNTER — Ambulatory Visit (AMBULATORY_SURGERY_CENTER): Payer: Medicare Other | Admitting: Internal Medicine

## 2020-10-09 ENCOUNTER — Encounter: Payer: Self-pay | Admitting: Internal Medicine

## 2020-10-09 VITALS — BP 158/73 | HR 96 | Temp 97.3°F | Resp 14 | Ht 61.0 in | Wt 124.0 lb

## 2020-10-09 DIAGNOSIS — K5730 Diverticulosis of large intestine without perforation or abscess without bleeding: Secondary | ICD-10-CM | POA: Diagnosis not present

## 2020-10-09 DIAGNOSIS — K921 Melena: Secondary | ICD-10-CM | POA: Diagnosis not present

## 2020-10-09 DIAGNOSIS — K625 Hemorrhage of anus and rectum: Secondary | ICD-10-CM | POA: Diagnosis not present

## 2020-10-09 DIAGNOSIS — Z86010 Personal history of colonic polyps: Secondary | ICD-10-CM | POA: Diagnosis not present

## 2020-10-09 DIAGNOSIS — K648 Other hemorrhoids: Secondary | ICD-10-CM

## 2020-10-09 MED ORDER — SODIUM CHLORIDE 0.9 % IV SOLN: 500.0000 mL | Freq: Once | INTRAVENOUS | Status: DC

## 2020-10-09 NOTE — Progress Notes (Signed)
PT taken to PACU. Monitors in place. VSS. Report given to RN. 

## 2020-10-09 NOTE — Op Note (Signed)
[facility] Patient Name: [name] Procedure Date: 10/09/2020 12:47 PM MRN: [id number] Endoscopist: Iva Boop , MD Age: 75 Referring MD:  Date of Birth: 03-Jul-1946 Gender: Female Account #: [id number] Procedure:                Colonoscopy Indications:              Rectal bleeding Medicines:                Propofol per Anesthesia Procedure:                Pre-Anesthesia Assessment:                           - Prior to the procedure, a History and Physical                            was performed, and patient medications and                            allergies were reviewed. The patient's tolerance of                            previous anesthesia was also reviewed. The risks                            and benefits of the procedure and the sedation                            options and risks were discussed with the patient.                            All questions were answered, and informed consent                            was obtained. Prior Anticoagulants: The patient has                            taken no previous anticoagulant or antiplatelet                            agents. ASA Grade Assessment: II - A patient with                            mild systemic disease. After reviewing the risks                            and benefits, the patient was deemed in                            satisfactory condition to undergo the procedure.                           After obtaining informed consent, the colonoscope  was passed under direct vision. Throughout the                            procedure, the patient's blood pressure, pulse, and                            oxygen saturations were monitored continuously. The                            Olympus PCF-H190DL (OT#[id number]) Colonoscope was                            introduced through the anus and advanced to the the                            cecum, identified by appendiceal orifice and                             ileocecal valve. The colonoscopy was performed                            without difficulty. The patient tolerated the                            procedure well. The quality of the bowel                            preparation was excellent. The bowel preparation                            used was Miralax via split dose instruction. The                            ileocecal valve, appendiceal orifice, and rectum                            were photographed. Scope In: 12:59:48 PM Scope Out: 1:12:33 PM Scope Withdrawal Time: 0 hours 8 minutes 48 seconds  Total Procedure Duration: 0 hours 12 minutes 45 seconds  Findings:                 The perianal and digital rectal examinations were                            normal.                           Multiple diverticula were found in the sigmoid                            colon.                           Internal hemorrhoids were found. The hemorrhoids  were small.                           The exam was otherwise without abnormality on                            direct and retroflexion views. Complications:            No immediate complications. Estimated Blood Loss:     Estimated blood loss: none. Impression:               - Diverticulosis in the sigmoid colon.                           - Internal hemorrhoids. Likely source of bleeding                           - The examination was otherwise normal on direct                            and retroflexion views.                           - No specimens collected.                           - Personal history of colonic polyp 2 mm adenoma                            2011. Recommendation:           - Patient has a contact number available for                            emergencies. The signs and symptoms of potential                            delayed complications were discussed with the                            patient. Return to normal  activities tomorrow.                            Written discharge instructions were provided to the                            patient.                           - Continue present medications.                           - No repeat colonoscopy due to age and the absence                            of colonic polyps. Iva Boop, MD 10/09/2020 1:21:04 PM This report has been signed electronically.

## 2020-10-09 NOTE — Progress Notes (Signed)
Pt's states no medical or surgical changes since previsit or office visit.  VS take by JD

## 2020-10-09 NOTE — Patient Instructions (Addendum)
No polyps or cancer seen.  I think the bleeding was related to constipation and straining and probably came from hemorrhoids.  I also saw diverticulosis - no signs of diverticulitis now.  Continue treatment for constipation as recommended by Dr. Darlyn Read.  Thanks for being patient as I was behind schedule.  I appreciate the opportunity to care for you. Iva Boop, MD, FACG   YOU HAD AN ENDOSCOPIC PROCEDURE TODAY AT THE [facility]:   Refer to the procedure report that was given to you for any specific questions about what was found during the examination.  If the procedure report does not answer your questions, please call your gastroenterologist to clarify.  If you requested that your care partner not be given the details of your procedure findings, then the procedure report has been included in a sealed envelope for you to review at your convenience later.  YOU SHOULD EXPECT: Some feelings of bloating in the abdomen. Passage of more gas than usual.  Walking can help get rid of the air that was put into your GI tract during the procedure and reduce the bloating. If you had a lower endoscopy (such as a colonoscopy or flexible sigmoidoscopy) you may notice spotting of blood in your stool or on the toilet paper. If you underwent a bowel prep for your procedure, you may not have a normal bowel movement for a few days.  Please Note:  You might notice some irritation and congestion in your nose or some drainage.  This is from the oxygen used during your procedure.  There is no need for concern and it should clear up in a day or so.  SYMPTOMS TO REPORT IMMEDIATELY:   Following lower endoscopy (colonoscopy or flexible sigmoidoscopy):  Excessive amounts of blood in the stool  Significant tenderness or worsening of abdominal pains  Swelling of the abdomen that is new, acute  Fever of 100F or higher   For urgent or emergent issues, a gastroenterologist can be reached at any hour  by calling [phone]. Do not use MyChart messaging for urgent concerns.    DIET:  We do recommend a small meal at first, but then you may proceed to your regular diet.  Drink plenty of fluids but you should avoid alcoholic beverages for 24 hours.  ACTIVITY:  You should plan to take it easy for the rest of today and you should NOT DRIVE or use heavy machinery until tomorrow (because of the sedation medicines used during the test).    FOLLOW UP: Our staff will call the number listed on your records 48-72 hours following your procedure to check on you and address any questions or concerns that you may have regarding the information given to you following your procedure. If we do not reach you, we will leave a message.  We will attempt to reach you two times.  During this call, we will ask if you have developed any symptoms of COVID 19. If you develop any symptoms (ie: fever, flu-like symptoms, shortness of breath, cough etc.) before then, please call [phone].  If you test positive for Covid 19 in the 2 weeks post procedure, please call and report this information to [place].     SIGNATURES/CONFIDENTIALITY: You and/or your care partner have signed paperwork which will be entered into your electronic medical record.  These signatures attest to the fact that that the information above on your After Visit Summary has been reviewed and is understood.  Full responsibility of the  confidentiality of this discharge information lies with you and/or your care-partner. 

## 2020-10-11 ENCOUNTER — Telehealth: Payer: Self-pay

## 2020-10-11 NOTE — Telephone Encounter (Signed)
  Follow up Call-  Call back number 10/09/2020  Post procedure Call Back phone  # [phone]  Permission to leave phone message Yes  Some recent data might be hidden     Patient questions:  Do you have a fever, pain , or abdominal swelling? No. Pain Score  0 *  Have you tolerated food without any problems? Yes.    Have you been able to return to your normal activities? Yes.    Do you have any questions about your discharge instructions: Diet   No. Medications  No. Follow up visit  No.  Do you have questions or concerns about your Care? No.  Actions: * If pain score is 4 or above: No action needed, pain <4.   1. Have you developed a fever since your procedure? No   2.   Have you had an respiratory symptoms (SOB or cough) since your procedure? No   3.   Have you tested positive for COVID 19 since your procedure? No   4.   Have you had any family members/close contacts diagnosed with the COVID 19 since your procedure?  No    If yes to any of these questions please route to Laverna Peace, RN and Karlton Lemon, RN

## 2020-10-16 ENCOUNTER — Other Ambulatory Visit: Payer: Self-pay | Admitting: Family Medicine

## 2020-10-16 DIAGNOSIS — E039 Hypothyroidism, unspecified: Secondary | ICD-10-CM

## 2020-11-21 ENCOUNTER — Other Ambulatory Visit: Payer: Self-pay

## 2020-11-21 ENCOUNTER — Ambulatory Visit
Admission: RE | Admit: 2020-11-21 | Discharge: 2020-11-21 | Disposition: A | Discharge status: Home / Self Care | Payer: Medicare Other | Source: Ambulatory Visit | Attending: Family Medicine | Admitting: Family Medicine

## 2020-11-21 DIAGNOSIS — Z1231 Encounter for screening mammogram for malignant neoplasm of breast: Secondary | ICD-10-CM

## 2020-12-29 ENCOUNTER — Other Ambulatory Visit: Payer: Self-pay | Admitting: Family Medicine

## 2020-12-29 DIAGNOSIS — I10 Essential (primary) hypertension: Secondary | ICD-10-CM

## 2021-02-28 ENCOUNTER — Other Ambulatory Visit: Payer: Self-pay | Admitting: Family Medicine

## 2021-02-28 DIAGNOSIS — I10 Essential (primary) hypertension: Secondary | ICD-10-CM

## 2021-03-27 ENCOUNTER — Ambulatory Visit (INDEPENDENT_AMBULATORY_CARE_PROVIDER_SITE_OTHER): Payer: Medicare Other | Admitting: Family Medicine

## 2021-03-27 ENCOUNTER — Encounter: Payer: Self-pay | Admitting: Family Medicine

## 2021-03-27 ENCOUNTER — Other Ambulatory Visit: Payer: Self-pay

## 2021-03-27 VITALS — BP 140/68 | HR 81 | Temp 98.1°F | Ht 61.0 in | Wt 126.6 lb

## 2021-03-27 DIAGNOSIS — Z23 Encounter for immunization: Secondary | ICD-10-CM

## 2021-03-27 DIAGNOSIS — I10 Essential (primary) hypertension: Secondary | ICD-10-CM

## 2021-03-27 DIAGNOSIS — E039 Hypothyroidism, unspecified: Secondary | ICD-10-CM | POA: Diagnosis not present

## 2021-03-27 DIAGNOSIS — J4530 Mild persistent asthma, uncomplicated: Secondary | ICD-10-CM | POA: Diagnosis not present

## 2021-03-27 DIAGNOSIS — E782 Mixed hyperlipidemia: Secondary | ICD-10-CM | POA: Diagnosis not present

## 2021-03-27 MED ORDER — AMLODIPINE BESYLATE 5 MG PO TABS: 5.0000 mg | ORAL_TABLET | Freq: Every day | ORAL | 3 refills | Status: DC

## 2021-03-27 MED ORDER — ALENDRONATE SODIUM 70 MG PO TABS: 70.0000 mg | ORAL_TABLET | ORAL | 3 refills | Status: DC

## 2021-03-27 MED ORDER — LISINOPRIL 20 MG PO TABS: 20.0000 mg | ORAL_TABLET | Freq: Every day | ORAL | 3 refills | Status: DC

## 2021-03-27 MED ORDER — LEVOTHYROXINE SODIUM 75 MCG PO TABS: 75.0000 ug | ORAL_TABLET | Freq: Every day | ORAL | 3 refills | Status: DC

## 2021-03-27 MED ORDER — FLUTICASONE-SALMETEROL 100-50 MCG/ACT IN AEPB: 1.0000 | INHALATION_SPRAY | Freq: Two times a day (BID) | RESPIRATORY_TRACT | 3 refills | Status: DC

## 2021-03-27 NOTE — Progress Notes (Signed)
Subjective:  Patient ID: [name], female    DOB: [birth year]  Age: 75 y.o. MRN: [id number]  CC: Medical Management of Chronic Issues   HPI [name] presents for  follow-up of hypertension. Patient has no history of headache chest pain or shortness of breath or recent cough. Patient also denies symptoms of TIA such as focal numbness or weakness. Patient denies side effects from medication. States taking it regularly.  follow-up on  thyroid. The patient has a history of hypothyroidism for many years. It has been stable recently. Pt. denies any change in  voice, loss of hair, heat or cold intolerance. Energy level has been adequate to good. Patient denies constipation and diarrhea. No myxedema. Medication is as noted below. Verified that pt is taking it daily on an empty stomach. Well tolerated.     History [name] has a past medical history of Allergy, Asthma, Colon polyps, GERD (gastroesophageal reflux disease), Hyperlipidemia, Hypertension, Menopausal state, Osteopenia, Rhinitis, Thyroid disease, and Vitamin D deficiency disease.   She has a past surgical history that includes Tonsillectomy; Cholecystectomy (2005); and Colonoscopy (2099).   Her family history includes AAA (abdominal aortic aneurysm) in her mother; Asthma in her brother; Colon cancer (age of onset: 25) in her mother; Colon polyps in her mother; Gout in her sister; Heart attack in her father; Heart disease in her father; Hyperlipidemia in her mother; Hypertension in her brother, mother, and sister; Osteoporosis in her mother; Thyroid disease in her mother.She reports that she has never smoked. She has never used smokeless tobacco. She reports that she does not drink alcohol and does not use drugs.  Current Outpatient Medications on File Prior to Visit  Medication Sig Dispense Refill   albuterol (VENTOLIN HFA) 108 (90 Base) MCG/ACT inhaler Inhale 2 puffs into the lungs every 6 (six) hours as needed for wheezing. 18 g 11    Cholecalciferol (VITAMIN D3) 3000 UNITS TABS Take 2,000 Units by mouth daily.      fluticasone (FLONASE) 50 MCG/ACT nasal spray Use 2 spray(s) in each nostril once daily 48 g 1   hydrocortisone (ANUSOL-HC) 25 MG suppository Place rectally 2 (two) times daily as needed.     Multiple Vitamin (MULTIVITAMIN WITH MINERALS) TABS Take 1 tablet by mouth daily.     pantoprazole (PROTONIX) 40 MG tablet Take 1 tablet (40 mg total) by mouth daily. For stomach (Patient not taking: No sig reported) 30 tablet 11   No current facility-administered medications on file prior to visit.    ROS Review of Systems  Constitutional:  Negative for appetite change, chills, diaphoresis, fatigue and fever.  HENT:  Negative for congestion, ear pain, hearing loss, postnasal drip, rhinorrhea, sore throat and trouble swallowing.   Respiratory:  Negative for cough, chest tightness and shortness of breath.   Cardiovascular:  Negative for chest pain and palpitations.  Gastrointestinal:  Negative for abdominal pain.  Musculoskeletal:  Negative for arthralgias.  Skin:  Negative for rash.   Objective:  BP 140/68   Pulse 81   Temp 98.1 F (36.7 C)   Ht _0  (1.549 m)   Wt 126 lb 9.6 oz (57.4 kg)   SpO2 100%   BMI 23.92 kg/m   BP Readings from Last 3 Encounters:  03/27/21 140/68  10/09/20 (!) 158/73  09/24/20 132/82    Wt Readings from Last 3 Encounters:  03/27/21 126 lb 9.6 oz (57.4 kg)  10/09/20 124 lb (56.2 kg)  09/25/20 124 lb (56.2 kg)  Physical Exam Constitutional:      General: She is not in acute distress.    Appearance: She is well-developed.  Cardiovascular:     Rate and Rhythm: Normal rate and regular rhythm.  Pulmonary:     Breath sounds: Normal breath sounds.  Musculoskeletal:        General: Normal range of motion.  Skin:    General: Skin is warm and dry.  Neurological:     Mental Status: She is alert and oriented to person, place, and time.      Assessment & Plan:   [name]  was seen today for medical management of chronic issues.  Diagnoses and all orders for this visit:  Mixed hyperlipidemia -     TSH + free T4 -     CBC with Differential/Platelet -     CMP14+EGFR -     Lipid panel  Essential hypertension -     amLODipine (NORVASC) 5 MG tablet; Take 1 tablet (5 mg total) by mouth daily. -     lisinopril (ZESTRIL) 20 MG tablet; Take 1 tablet (20 mg total) by mouth daily. -     TSH + free T4 -     CBC with Differential/Platelet -     CMP14+EGFR -     Lipid panel  Hypothyroidism, unspecified type -     levothyroxine (EUTHYROX) 75 MCG tablet; Take 1 tablet (75 mcg total) by mouth daily. -     TSH + free T4 -     CBC with Differential/Platelet -     CMP14+EGFR  Mild persistent chronic asthma without complication -     CBC with Differential/Platelet -     CMP14+EGFR  Other orders -     alendronate (FOSAMAX) 70 MG tablet; Take 1 tablet (70 mg total) by mouth every 7 (seven) days. Take with a full glass of water on an empty stomach. -     fluticasone-salmeterol (ADVAIR) 100-50 MCG/ACT AEPB; Inhale 1 puff into the lungs 2 (two) times daily.  Allergies as of 03/27/2021       Reactions   Evista [raloxifene Hcl] Other (See Comments)   Bone pain, couldn't put weight on right leg/foot   Fosamax [alendronate Sodium]    Bone pain   Red Dye    Sulfa Antibiotics         Medication List        Accurate as of March 27, 2021 10:36 AM. If you have any questions, ask your nurse or doctor.          albuterol 108 (90 Base) MCG/ACT inhaler Commonly known as: VENTOLIN HFA Inhale 2 puffs into the lungs every 6 (six) hours as needed for wheezing.   alendronate 70 MG tablet Commonly known as: FOSAMAX Take 1 tablet (70 mg total) by mouth every 7 (seven) days. Take with a full glass of water on an empty stomach.   amLODipine 5 MG tablet Commonly known as: NORVASC Take 1 tablet (5 mg total) by mouth daily. What changed: additional instructions Changed  by: [name], MD   fluticasone 50 MCG/ACT nasal spray Commonly known as: FLONASE Use 2 spray(s) in each nostril once daily   fluticasone-salmeterol 100-50 MCG/ACT Aepb Commonly known as: ADVAIR Inhale 1 puff into the lungs 2 (two) times daily. What changed:  medication strength how much to take how to take this when to take this additional instructions Changed by: [name], MD   hydrocortisone 25 MG suppository Commonly known as: Forensic scientist  rectally 2 (two) times daily as needed.   levothyroxine 75 MCG tablet Commonly known as: Euthyrox Take 1 tablet (75 mcg total) by mouth daily.   lisinopril 20 MG tablet Commonly known as: ZESTRIL Take 1 tablet (20 mg total) by mouth daily.   multivitamin with minerals Tabs tablet Take 1 tablet by mouth daily.   pantoprazole 40 MG tablet Commonly known as: PROTONIX Take 1 tablet (40 mg total) by mouth daily. For stomach   Vitamin D3 75 MCG (3000 UT) Tabs Take 2,000 Units by mouth daily.        Meds ordered this encounter  Medications   amLODipine (NORVASC) 5 MG tablet    Sig: Take 1 tablet (5 mg total) by mouth daily.    Dispense:  90 tablet    Refill:  3   levothyroxine (EUTHYROX) 75 MCG tablet    Sig: Take 1 tablet (75 mcg total) by mouth daily.    Dispense:  90 tablet    Refill:  3   lisinopril (ZESTRIL) 20 MG tablet    Sig: Take 1 tablet (20 mg total) by mouth daily.    Dispense:  90 tablet    Refill:  3   alendronate (FOSAMAX) 70 MG tablet    Sig: Take 1 tablet (70 mg total) by mouth every 7 (seven) days. Take with a full glass of water on an empty stomach.    Dispense:  13 tablet    Refill:  3   fluticasone-salmeterol (ADVAIR) 100-50 MCG/ACT AEPB    Sig: Inhale 1 puff into the lungs 2 (two) times daily.    Dispense:  180 each    Refill:  3      Follow-up: Return in about 6 months (around 09/27/2021).  [name], M.D.

## 2021-03-27 NOTE — Addendum Note (Signed)
Addended by: Adella Hare B on: 03/27/2021 10:50 AM   Modules accepted: Orders

## 2021-03-28 LAB — CMP14+EGFR
ALT: 11 IU/L (ref 0–32)
AST: 14 IU/L (ref 0–40)
Albumin/Globulin Ratio: 1.6 (ref 1.2–2.2)
Albumin: 4.3 g/dL (ref 3.7–4.7)
Alkaline Phosphatase: 68 IU/L (ref 44–121)
BUN/Creatinine Ratio: 11 — ABNORMAL LOW (ref 12–28)
BUN: 10 mg/dL (ref 8–27)
Bilirubin Total: 0.6 mg/dL (ref 0.0–1.2)
CO2: 26 mmol/L (ref 20–29)
Calcium: 10.8 mg/dL — ABNORMAL HIGH (ref 8.7–10.3)
Chloride: 100 mmol/L (ref 96–106)
Creatinine, Ser: 0.88 mg/dL (ref 0.57–1.00)
Globulin, Total: 2.7 g/dL (ref 1.5–4.5)
Glucose: 95 mg/dL (ref 65–99)
Potassium: 5.3 mmol/L — ABNORMAL HIGH (ref 3.5–5.2)
Sodium: 136 mmol/L (ref 134–144)
Total Protein: 7 g/dL (ref 6.0–8.5)
eGFR: 68 mL/min/{1.73_m2} (ref >59)

## 2021-03-28 LAB — CBC WITH DIFFERENTIAL/PLATELET
Basophils Absolute: 0.1 10*3/uL (ref 0.0–0.2)
Basos: 1 %
EOS (ABSOLUTE): 0.1 10*3/uL (ref 0.0–0.4)
Eos: 2 %
Hematocrit: 40.8 % (ref 34.0–46.6)
Hemoglobin: 13.6 g/dL (ref 11.1–15.9)
Immature Grans (Abs): 0 10*3/uL (ref 0.0–0.1)
Immature Granulocytes: 0 %
Lymphocytes Absolute: 0.8 10*3/uL (ref 0.7–3.1)
Lymphs: 17 %
MCH: 29.7 pg (ref 26.6–33.0)
MCHC: 33.3 g/dL (ref 31.5–35.7)
MCV: 89 fL (ref 79–97)
Monocytes Absolute: 0.4 10*3/uL (ref 0.1–0.9)
Monocytes: 8 %
Neutrophils Absolute: 3.7 10*3/uL (ref 1.4–7.0)
Neutrophils: 72 %
Platelets: 316 10*3/uL (ref 150–450)
RBC: 4.58 x10E6/uL (ref 3.77–5.28)
RDW: 12.7 % (ref 11.7–15.4)
WBC: 5.1 10*3/uL (ref 3.4–10.8)

## 2021-03-28 LAB — LIPID PANEL
Chol/HDL Ratio: 3 ratio (ref 0.0–4.4)
Cholesterol, Total: 214 mg/dL — ABNORMAL HIGH (ref 100–199)
HDL: 71 mg/dL (ref >39)
LDL Chol Calc (NIH): 123 mg/dL — ABNORMAL HIGH (ref 0–99)
Triglycerides: 112 mg/dL (ref 0–149)
VLDL Cholesterol Cal: 20 mg/dL (ref 5–40)

## 2021-03-28 LAB — TSH+FREE T4
Free T4: 1.8 ng/dL — ABNORMAL HIGH (ref 0.82–1.77)
TSH: 0.031 u[IU]/mL — ABNORMAL LOW (ref 0.450–4.500)

## 2021-04-04 NOTE — Progress Notes (Signed)
R/C about labs   

## 2021-05-30 ENCOUNTER — Ambulatory Visit (INDEPENDENT_AMBULATORY_CARE_PROVIDER_SITE_OTHER): Payer: Medicare Other

## 2021-05-30 ENCOUNTER — Other Ambulatory Visit: Payer: Self-pay

## 2021-05-30 DIAGNOSIS — Z23 Encounter for immunization: Secondary | ICD-10-CM | POA: Diagnosis not present

## 2021-06-22 ENCOUNTER — Observation Stay (HOSPITAL_COMMUNITY)
Admission: EM | Admit: 2021-06-22 | Discharge: 2021-06-24 | Disposition: A | Discharge status: Home / Self Care | Payer: Medicare Other | Attending: Internal Medicine | Admitting: Internal Medicine

## 2021-06-22 ENCOUNTER — Observation Stay (HOSPITAL_COMMUNITY): Payer: Medicare Other

## 2021-06-22 ENCOUNTER — Emergency Department (HOSPITAL_COMMUNITY): Payer: Medicare Other

## 2021-06-22 ENCOUNTER — Encounter (HOSPITAL_COMMUNITY): Payer: Self-pay | Admitting: *Deleted

## 2021-06-22 DIAGNOSIS — Y900 Blood alcohol level of less than 20 mg/100 ml: Secondary | ICD-10-CM | POA: Insufficient documentation

## 2021-06-22 DIAGNOSIS — J45909 Unspecified asthma, uncomplicated: Secondary | ICD-10-CM | POA: Insufficient documentation

## 2021-06-22 DIAGNOSIS — Z20822 Contact with and (suspected) exposure to covid-19: Secondary | ICD-10-CM | POA: Insufficient documentation

## 2021-06-22 DIAGNOSIS — R481 Agnosia: Secondary | ICD-10-CM | POA: Diagnosis not present

## 2021-06-22 DIAGNOSIS — R29818 Other symptoms and signs involving the nervous system: Secondary | ICD-10-CM | POA: Diagnosis not present

## 2021-06-22 DIAGNOSIS — E039 Hypothyroidism, unspecified: Secondary | ICD-10-CM | POA: Diagnosis not present

## 2021-06-22 DIAGNOSIS — I63312 Cerebral infarction due to thrombosis of left middle cerebral artery: Secondary | ICD-10-CM

## 2021-06-22 DIAGNOSIS — Z8673 Personal history of transient ischemic attack (TIA), and cerebral infarction without residual deficits: Secondary | ICD-10-CM | POA: Insufficient documentation

## 2021-06-22 DIAGNOSIS — I639 Cerebral infarction, unspecified: Secondary | ICD-10-CM | POA: Diagnosis not present

## 2021-06-22 DIAGNOSIS — R200 Anesthesia of skin: Secondary | ICD-10-CM

## 2021-06-22 DIAGNOSIS — R531 Weakness: Secondary | ICD-10-CM | POA: Diagnosis not present

## 2021-06-22 DIAGNOSIS — R4781 Slurred speech: Secondary | ICD-10-CM | POA: Diagnosis not present

## 2021-06-22 DIAGNOSIS — R4701 Aphasia: Secondary | ICD-10-CM | POA: Insufficient documentation

## 2021-06-22 DIAGNOSIS — I10 Essential (primary) hypertension: Secondary | ICD-10-CM | POA: Diagnosis present

## 2021-06-22 DIAGNOSIS — G459 Transient cerebral ischemic attack, unspecified: Secondary | ICD-10-CM | POA: Diagnosis present

## 2021-06-22 DIAGNOSIS — Q283 Other malformations of cerebral vessels: Secondary | ICD-10-CM | POA: Diagnosis not present

## 2021-06-22 DIAGNOSIS — M47812 Spondylosis without myelopathy or radiculopathy, cervical region: Secondary | ICD-10-CM | POA: Diagnosis not present

## 2021-06-22 DIAGNOSIS — Z981 Arthrodesis status: Secondary | ICD-10-CM | POA: Diagnosis not present

## 2021-06-22 DIAGNOSIS — Z79899 Other long term (current) drug therapy: Secondary | ICD-10-CM | POA: Insufficient documentation

## 2021-06-22 DIAGNOSIS — M4322 Fusion of spine, cervical region: Secondary | ICD-10-CM | POA: Diagnosis not present

## 2021-06-22 DIAGNOSIS — E785 Hyperlipidemia, unspecified: Secondary | ICD-10-CM | POA: Diagnosis present

## 2021-06-22 DIAGNOSIS — R488 Other symbolic dysfunctions: Secondary | ICD-10-CM

## 2021-06-22 DIAGNOSIS — J4540 Moderate persistent asthma, uncomplicated: Secondary | ICD-10-CM

## 2021-06-22 LAB — RAPID URINE DRUG SCREEN, HOSP PERFORMED
Amphetamines: NOT DETECTED
Barbiturates: NOT DETECTED
Benzodiazepines: NOT DETECTED
Cocaine: NOT DETECTED
Opiates: NOT DETECTED
Tetrahydrocannabinol: NOT DETECTED

## 2021-06-22 LAB — URINALYSIS, ROUTINE W REFLEX MICROSCOPIC
Bacteria, UA: NONE SEEN
Bilirubin Urine: NEGATIVE
Glucose, UA: NEGATIVE mg/dL
Ketones, ur: NEGATIVE mg/dL
Leukocytes,Ua: NEGATIVE
Nitrite: NEGATIVE
Protein, ur: NEGATIVE mg/dL
Specific Gravity, Urine: 1.003 — ABNORMAL LOW (ref 1.005–1.030)
pH: 7 (ref 5.0–8.0)

## 2021-06-22 LAB — CBC
HCT: 40.5 % (ref 36.0–46.0)
Hemoglobin: 13.4 g/dL (ref 12.0–15.0)
MCH: 29.9 pg (ref 26.0–34.0)
MCHC: 33.1 g/dL (ref 30.0–36.0)
MCV: 90.4 fL (ref 80.0–100.0)
Platelets: 271 10*3/uL (ref 150–400)
RBC: 4.48 MIL/uL (ref 3.87–5.11)
RDW: 13.1 % (ref 11.5–15.5)
WBC: 6.4 10*3/uL (ref 4.0–10.5)
nRBC: 0 % (ref 0.0–0.2)

## 2021-06-22 LAB — COMPREHENSIVE METABOLIC PANEL
ALT: 15 U/L (ref 0–44)
AST: 15 U/L (ref 15–41)
Albumin: 4 g/dL (ref 3.5–5.0)
Alkaline Phosphatase: 63 U/L (ref 38–126)
Anion gap: 6 (ref 5–15)
BUN: 7 mg/dL — ABNORMAL LOW (ref 8–23)
CO2: 26 mmol/L (ref 22–32)
Calcium: 10.1 mg/dL (ref 8.9–10.3)
Chloride: 106 mmol/L (ref 98–111)
Creatinine, Ser: 0.79 mg/dL (ref 0.44–1.00)
GFR, Estimated: >60 mL/min (ref >60)
Glucose, Bld: 110 mg/dL — ABNORMAL HIGH (ref 70–99)
Potassium: 3.7 mmol/L (ref 3.5–5.1)
Sodium: 138 mmol/L (ref 135–145)
Total Bilirubin: 0.3 mg/dL (ref 0.3–1.2)
Total Protein: 7.8 g/dL (ref 6.5–8.1)

## 2021-06-22 LAB — RESP PANEL BY RT-PCR (FLU A&B, COVID) ARPGX2
Influenza A by PCR: NEGATIVE
Influenza B by PCR: NEGATIVE
SARS Coronavirus 2 by RT PCR: NEGATIVE

## 2021-06-22 LAB — PROTIME-INR
INR: 1 (ref 0.8–1.2)
Prothrombin Time: 12.7 seconds (ref 11.4–15.2)

## 2021-06-22 LAB — DIFFERENTIAL
Abs Immature Granulocytes: 0.03 10*3/uL (ref 0.00–0.07)
Basophils Absolute: 0 10*3/uL (ref 0.0–0.1)
Basophils Relative: 1 %
Eosinophils Absolute: 0.1 10*3/uL (ref 0.0–0.5)
Eosinophils Relative: 1 %
Immature Granulocytes: 1 %
Lymphocytes Relative: 11 %
Lymphs Abs: 0.7 10*3/uL (ref 0.7–4.0)
Monocytes Absolute: 0.4 10*3/uL (ref 0.1–1.0)
Monocytes Relative: 6 %
Neutro Abs: 5.2 10*3/uL (ref 1.7–7.7)
Neutrophils Relative %: 80 %

## 2021-06-22 LAB — CBG MONITORING, ED: Glucose-Capillary: 102 mg/dL — ABNORMAL HIGH (ref 70–99)

## 2021-06-22 LAB — ETHANOL: Alcohol, Ethyl (B): <10 mg/dL (ref <10)

## 2021-06-22 LAB — APTT: aPTT: 28 seconds (ref 24–36)

## 2021-06-22 MED ORDER — IOHEXOL 350 MG/ML SOLN
75.0000 mL | Freq: Once | INTRAVENOUS | Status: AC | PRN
  Administered 2021-06-22: 75 mL via INTRAVENOUS

## 2021-06-22 MED ORDER — STROKE: EARLY STAGES OF RECOVERY BOOK
Freq: Once | Status: DC
  Filled 2021-06-22: qty 1

## 2021-06-22 MED ORDER — ASPIRIN EC 325 MG PO TBEC
325.0000 mg | DELAYED_RELEASE_TABLET | Freq: Once | ORAL | Status: AC
  Administered 2021-06-22: 325 mg via ORAL
  Filled 2021-06-22: qty 1

## 2021-06-22 MED ORDER — ASPIRIN EC 81 MG PO TBEC
81.0000 mg | DELAYED_RELEASE_TABLET | Freq: Every day | ORAL | Status: DC
  Administered 2021-06-23 – 2021-06-24 (×2): 81 mg via ORAL
  Filled 2021-06-22 (×2): qty 1

## 2021-06-22 MED ORDER — MOMETASONE FURO-FORMOTEROL FUM 100-5 MCG/ACT IN AERO
2.0000 | INHALATION_SPRAY | Freq: Two times a day (BID) | RESPIRATORY_TRACT | Status: DC
  Administered 2021-06-22: 2 via RESPIRATORY_TRACT
  Filled 2021-06-22: qty 8.8

## 2021-06-22 MED ORDER — PANTOPRAZOLE SODIUM 40 MG PO TBEC
40.0000 mg | DELAYED_RELEASE_TABLET | Freq: Every day | ORAL | Status: DC
  Administered 2021-06-22 – 2021-06-24 (×3): 40 mg via ORAL
  Filled 2021-06-22 (×3): qty 1

## 2021-06-22 MED ORDER — ENOXAPARIN SODIUM 40 MG/0.4ML IJ SOSY
40.0000 mg | PREFILLED_SYRINGE | INTRAMUSCULAR | Status: DC
  Administered 2021-06-22 – 2021-06-23 (×2): 40 mg via SUBCUTANEOUS
  Filled 2021-06-22 (×2): qty 0.4

## 2021-06-22 MED ORDER — LEVOTHYROXINE SODIUM 75 MCG PO TABS
75.0000 ug | ORAL_TABLET | Freq: Every day | ORAL | Status: DC
  Administered 2021-06-23 – 2021-06-24 (×2): 75 ug via ORAL
  Filled 2021-06-22: qty 2
  Filled 2021-06-22: qty 1
  Filled 2021-06-22: qty 2
  Filled 2021-06-22: qty 1

## 2021-06-22 NOTE — ED Notes (Signed)
Pt to CT

## 2021-06-22 NOTE — Consult Note (Signed)
NEUROLOGY TELECONSULTATION NOTE   Date of service: June 22, 2021 Patient Name: [name] MRN:  [id number] DOB:  1945-11-14 Reason for consult: telestroke  Requesting Provider: Dr. Tanda Rockers Consult Participants: myself, patient, bedside RN, telestroke RN Location of the provider: [facility] Location of the patient: [name]  This consult was provided via telemedicine with 2-way video and audio communication. The patient/family was informed that care would be provided in this way and agreed to receive care in this manner.   _ _ _   _ __   _ __ _ _  __ __   _ __   __ _  History of Present Illness   This is a 75 year old woman with a past medical history significant for hypertension, hyperlipidemia, thyroid disease who presented to the emergency department at [facility] after a transient episode of right-sided weakness and expressive aphasia today, now resolved.  Her last known well was 11:45 AM.  She was reading a book and then she went to go write down the name of the Thereasa [name] and was unable to write due to weakness of her right hand.  She did stumble when she stood up but she does not think that she had weakness in her right leg beyond chronic stiffness in her hip that often makes her stumble.  She called her sister and had difficulty getting her words out.    Telestroke was activated upon arrival to the emergency department.  On my examination her symptoms had resolved and her stroke scale was 0.  Her CT head showed no acute intracranial process.  There was asymmetric hypoattenuation in the posterior limb of the left internal capsule which may be chronic white matter ischemia but acute infarct cannot be excluded.  ASPECTS was 9.  Examination is not consistent with LVO therefore no further advanced imaging was obtained.  Due to resolution of symptoms patient was not a TNKase candidate however close monitoring was recommended until she is outside the window at 4:15 PM today.  I did explain the  TNKase decision making to her.  I also reviewed the exclusion criteria with her because of her symptoms recurred prior to 4:15 PM she could potentially be a TNKase candidate and she did not meet any exclusion criteria for TNK. She is not on anticoagulation at home. She has never had a stroke in the past that she knows about.    ROS   Per HPI; all other systems reviewed and are negative  Past History   The following was personally reviewed:  Past Medical History:  Diagnosis Date   Allergy    seasonal allergies   Asthma    uses inhaler    Colon polyps    GERD (gastroesophageal reflux disease)    with certain foods   Hyperlipidemia    on meds   Hypertension    on meds   Menopausal state    Osteopenia    Rhinitis    Thyroid disease    on meds   Vitamin D deficiency disease    Past Surgical History:  Procedure Laterality Date   CHOLECYSTECTOMY  2005   COLONOSCOPY  2099   CG-F/V-miralax(exc)-TA   TONSILLECTOMY     Family History  Problem Relation Age of Onset   Hyperlipidemia Mother    Hypertension Mother    Thyroid disease Mother    Osteoporosis Mother    Colon polyps Mother    Colon cancer Mother 41   AAA (abdominal aortic aneurysm) Mother  Heart disease Father    Heart attack Father        heavy smoker   Hypertension Sister    Gout Sister    Hypertension Brother    Asthma Brother    Esophageal cancer Neg Hx    Stomach cancer Neg Hx    Social History   Socioeconomic History   Marital status: Single    Spouse name: Not on file   Number of children: 0   Years of education: 12   Highest education level: 12th grade  Occupational History   Occupation: retired    Associate Professor: [employer]    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 Never married and no children. Retired from [employer] in the [employer].    Social Determinants of Health   Financial Resource Strain: Not on file  Food Insecurity: Not on file  Transportation Needs: Not on file  Physical Activity: Not on file  Stress: Not on file  Social Connections: Not on file   Allergies  Allergen Reactions   Evista [Raloxifene Hcl] Other (See Comments)    Bone pain, couldn't put weight on right leg/foot   Fosamax [Alendronate Sodium]     Bone pain   Red Dye    Sulfa Antibiotics     Medications   (Not in a hospital admission)    No current facility-administered medications for this encounter.  Current Outpatient Medications:    albuterol (VENTOLIN HFA) 108 (90 Base) MCG/ACT inhaler, Inhale 2 puffs into the lungs every 6 (six) hours as needed for wheezing., Disp: 18 g, Rfl: 11   alendronate (FOSAMAX) 70 MG tablet, Take 1 tablet (70 mg total) by mouth every 7 (seven) days. Take with a full glass of water on an empty stomach., Disp: 13 tablet, Rfl: 3   amLODipine (NORVASC) 5 MG tablet, Take 1 tablet (5 mg total) by mouth daily., Disp: 90 tablet, Rfl: 3   Cholecalciferol (VITAMIN D3) 3000 UNITS TABS, Take 2,000 Units by mouth daily. , Disp: , Rfl:    fluticasone (FLONASE) 50 MCG/ACT nasal spray, Use 2 spray(s) in each nostril once daily, Disp: 48 g, Rfl: 1   fluticasone-salmeterol (ADVAIR) 100-50 MCG/ACT AEPB, Inhale 1 puff into the lungs 2 (two) times daily., Disp: 180 each, Rfl: 3   hydrocortisone (ANUSOL-HC) 25 MG suppository, Place rectally 2 (two) times daily as needed., Disp: , Rfl:    levothyroxine (EUTHYROX) 75 MCG tablet, Take 1 tablet (75 mcg total) by mouth daily., Disp: 90 tablet, Rfl: 3   lisinopril (ZESTRIL) 20 MG tablet, Take 1 tablet (20 mg total) by mouth daily., Disp: 90 tablet, Rfl: 3   Multiple Vitamin (MULTIVITAMIN WITH MINERALS) TABS, Take 1 tablet by mouth  daily., Disp: , Rfl:    pantoprazole (PROTONIX) 40 MG tablet, Take 1 tablet (40 mg total) by mouth daily. For stomach (Patient not taking: No sig reported), Disp: 30 tablet, Rfl: 11  Vitals   Vitals:   06/22/21 1229 06/22/21 1241 06/22/21 1245  BP: (!) 177/75  (!) 176/79  Pulse: (!) 106  (!) 104  Resp: 15  16  Temp: 97.7 F (36.5 C)  TempSrc: Oral    SpO2: 97%  100%  Weight:  58.6 kg      Body mass index is 24.41 kg/m.  Physical Exam   Exam performed over telemedicine with 2-way video and audio communication and with assistance of bedside RN  Physical Exam Gen: A&O x4, NAD Resp: normal WOB CV: extremities appear well-perfused  Neuro: *MS: A&O x4. Follows multi-step commands.  *Speech: nondysarthric, no aphasia, able to name and repeat *CN: PERRL 37mm, EOMI, VFF by confrontation, sensation intact, smile symmetric, hearing intact to voice *Motor:   Normal bulk.  No tremor, rigidity or bradykinesia. No pronator drift. All extremities appear full-strength and symmetric. *Sensory: SILT. Symmetric. No double-simultaneous extinction.  *Coordination:  Finger-to-nose, heel-to-shin, rapid alternating motions were intact. *Reflexes:  UTA 2/2 tele-exam *Gait: deferred  NIHSS = 0   Premorbid mRS = 1   Labs   CBC:  Recent Labs  Lab 06/22/21 1303  WBC 6.4  NEUTROABS 5.2  HGB 13.4  HCT 40.5  MCV 90.4  PLT 271    Basic Metabolic Panel:  Lab Results  Component Value Date   NA 136 03/27/2021   K 5.3 (H) 03/27/2021   CO2 26 03/27/2021   GLUCOSE 95 03/27/2021   BUN 10 03/27/2021   CREATININE 0.88 03/27/2021   CALCIUM 10.8 (H) 03/27/2021   GFRNONAA 58 (L) 08/29/2020   GFRAA 67 08/29/2020   Lipid Panel:  Lab Results  Component Value Date   LDLCALC 123 (H) 03/27/2021   HgbA1c: No results found for: HGBA1C Urine Drug Screen: No results found for: LABOPIA, COCAINSCRNUR, LABBENZ, AMPHETMU, THCU, LABBARB  Alcohol Level No results found for: ETH   Impression    This is a 75 year old woman with a past medical history significant for hypertension, hyperlipidemia, thyroid disease who presented to the emergency department at [facility] after a transient episode of right-sided weakness and expressive aphasia today, now resolved, c/f TIA.   Recommendations   - Patient not administered TNK 2/2 resolution of sx however she would be a candidate for thrombolytics if sx recurred within the 4.5 hr window (prior to 1615 today). Recommend close monitoring in ED until 1615 with VS q 15 min, NIHSS q 30 min, immediate reactivation of telestroke if change in neurologic exam. - If no change in exam by 1615 recommend transfer to [facility] hospitalist service for stroke workup [facility] does not have MRI over the weekend). Stroke workup should include the following: - ASA 325mg  daily f/b 81mg  daily after that - Add plavix 75mg  daily x21 days if MRI (+) for acute stroke - Permissive HTN x48 hrs from sx onset or until stroke ruled out by MRI goal BP <220/110. PRN labetalol or hydralazine if BP above these parameters. Avoid oral antihypertensives. - MRI brain wo contrast - CTA or MRA H&N - TTE  - Check A1c and LDL + add statin per guidelines - q4 hr neuro checks - STAT head CT for any change in neuro exam - Tele - PT/OT/SLP - Stroke education - Amb referral to neurology upon discharge   Upon arrival to please contact neurohospitalist team at [facility] to follow along.  D/w Dr. [facility] ED ______________________________________________________________________   Thank you for the opportunity to take part in the care of this patient. If you have any further questions, please contact the neurology consultation attending.  Signed,  Redge Gainer, MD [facility] [phone]  If 7pm- 7am, please page neurology on call as listed in AMION.

## 2021-06-22 NOTE — ED Triage Notes (Signed)
States she has right sided weakness and difficulty speaking prior to arrival, states speech is getting better, symptoms started about an hour ago.

## 2021-06-22 NOTE — Progress Notes (Signed)
1238 call time 1239 beeper time 1250 exam started 1252 exam finished 1256 Images sent to [facility] 1300 exam completed in [facility]iology called

## 2021-06-22 NOTE — ED Notes (Addendum)
Pt doesn't meet Tnk criteria per Neurologist. Pt unsure if agreeable to Tnk if symptoms worsen. Neurologist reviewed risks/benefits and will revisit as needed. Plan to transfer to [facility] for MRI. New orders for MNIHSS q30 min and q15 min VS per neurologist. Reactivate code stroke for any changes in stroke scale.

## 2021-06-22 NOTE — H&P (Signed)
TR[facility] H&P   Patient Demographics:    [name], is a 75 y.o. female  MRN: [id number]   DOB - Dec 02, 1945  Admit Date - 06/22/2021  Outpatient Primary MD for the patient is Mechele Claude, MD  Referring MD/NP/PA: Dr Jillyn Hidden  Patient coming from: Home  Chief Complaint  Patient presents with   Code Stroke      HPI:    [name]  is a 75 y.o. female, with past medical history of hypertension, hyperlipidemia, hypothyroidism, she presents to emergency room secondary to complaints of right-sided weakness, and expressive aphasia this afternoon , symptoms have resolved by now, reports she was reading a book, when she went to go write down the name of the [name], she was unable to write due to weakness in her right hand, as well she did stumble when she stood up, she noted right upper/lower extremity weakness, and patient called her sister where she had difficulty getting her words out, patient was brought to EMS, patient had CT head done, and when she is back from CT, apparently all the symptoms has resolved, patient was seen by telestroke team, notion of symptoms she was not a TNKase candidate. - in ED currently patient symptom-free, with no significant lab abnormalities, blood pressure is elevated most recent 158/54, CT head with asymmetric hypoattenuation in the posterior limb of internal capsule, nonspecific, likely due to chronic white matter ischemia, but acute infarction is not excluded, [facility] consulted to admit..  Review of systems:    In addition to the HPI above,  No Fever-chills, No Headache, No changes with Vision or hearing, No problems swallowing food or Liquids, No Chest pain, Cough or Shortness of Breath, No Abdominal pain, No Nausea or Vommitting, Bowel movements are regular, No Blood in stool or Urine, No dysuria, No new skin rashes or bruises, No new joints  pains-aches,  Patient reports episode of right-sided weakness, aphasia, currently resolved No recent weight gain or loss, No polyuria, polydypsia or polyphagia, No significant Mental Stressors.  A full 10 point Review of Systems was done, except as stated above, all other Review of Systems were negative.   With Past History of the following :    Past Medical History:  Diagnosis Date   Allergy    seasonal allergies   Asthma    uses inhaler    Colon polyps    GERD (gastroesophageal reflux disease)    with certain foods   Hyperlipidemia    on meds   Hypertension    on meds   Menopausal state    Osteopenia    Rhinitis    Thyroid disease    on meds   Vitamin D deficiency disease       Past Surgical History:  Procedure Laterality Date   CHOLECYSTECTOMY  2005   COLONOSCOPY  2099   CG-F/V-miralax(exc)-TA   TONSILLECTOMY  Social History:     Social History   Tobacco Use   Smoking status: Never   Smokeless tobacco: Never  Substance Use Topics   Alcohol use: No       Family History :     Family History  Problem Relation Age of Onset   Hyperlipidemia Mother    Hypertension Mother    Thyroid disease Mother    Osteoporosis Mother    Colon polyps Mother    Colon cancer Mother 4   AAA (abdominal aortic aneurysm) Mother    Heart disease Father    Heart attack Father        heavy smoker   Hypertension Sister    Gout Sister    Hypertension Brother    Asthma Brother    Esophageal cancer Neg Hx    Stomach cancer Neg Hx      Home Medications:   Prior to Admission medications   Medication Sig Start Date End Date Taking? Authorizing Provider  albuterol (VENTOLIN HFA) 108 (90 Base) MCG/ACT inhaler Inhale 2 puffs into the lungs every 6 (six) hours as needed for wheezing. 01/24/20  Yes Stacks, Broadus John, MD  amLODipine (NORVASC) 5 MG tablet Take 1 tablet (5 mg total) by mouth daily. 03/27/21  Yes Mechele Claude, MD  fluticasone (FLONASE) 50 MCG/ACT nasal spray  Use 2 spray(s) in each nostril once daily Patient taking differently: Place 2 sprays into both nostrils every evening. Use 2 spray(s) in each nostril once daily 08/29/20  Yes Stacks, Broadus John, MD  fluticasone-salmeterol (ADVAIR) 100-50 MCG/ACT AEPB Inhale 1 puff into the lungs 2 (two) times daily. 03/27/21  Yes Stacks, Broadus John, MD  levothyroxine (EUTHYROX) 75 MCG tablet Take 1 tablet (75 mcg total) by mouth daily. 03/27/21  Yes Mechele Claude, MD  lisinopril (ZESTRIL) 20 MG tablet Take 1 tablet (20 mg total) by mouth daily. 03/27/21  Yes Mechele Claude, MD  alendronate (FOSAMAX) 70 MG tablet Take 1 tablet (70 mg total) by mouth every 7 (seven) days. Take with a full glass of water on an empty stomach. Patient not taking: No sig reported 03/27/21   Mechele Claude, MD  Cholecalciferol (VITAMIN D3) 3000 UNITS TABS Take 2,000 Units by mouth daily.  Patient not taking: No sig reported    [provider]  hydrocortisone (ANUSOL-HC) 25 MG suppository Place rectally 2 (two) times daily as needed. Patient not taking: No sig reported 07/30/20 07/30/21  [provider]  Multiple Vitamin (MULTIVITAMIN WITH MINERALS) TABS Take 1 tablet by mouth daily. Patient not taking: No sig reported    [provider]  pantoprazole (PROTONIX) 40 MG tablet Take 1 tablet (40 mg total) by mouth daily. For stomach Patient not taking: No sig reported 08/29/20   Mechele Claude, MD     Allergies:     Allergies  Allergen Reactions   Evista [Raloxifene Hcl] Other (See Comments)    Bone pain, couldn't put weight on right leg/foot   Fosamax [Alendronate Sodium]     Bone pain   Red Dye    Sulfa Antibiotics      Physical Exam:   Vitals  Blood pressure (!) 158/54, pulse 94, temperature 97.7 F (36.5 C), temperature source Oral, resp. rate 13, weight 58.6 kg, SpO2 100 %.   1. General well developed female, laying in bed, in no apparent distress  2. Normal affect and insight, Not Suicidal or Homicidal, Awake  Alert, Oriented X 3.  3. No F.N deficits, ALL C.Nerves Intact, Strength 5/5 all 4  extremities, Sensation intact all 4 extremities, Plantars down going.  4. Ears and Eyes appear Normal, Conjunctivae clear, PERRLA. Moist Oral Mucosa.  5. Supple Neck, No JVD, No cervical lymphadenopathy appriciated, No Carotid Bruits.  6. Symmetrical Chest wall movement, Good air movement bilaterally, CTAB.  7. RRR, No Gallops, Rubs or Murmurs, No Parasternal Heave.  8. Positive Bowel Sounds, Abdomen Soft, No tenderness, No organomegaly appriciated,No rebound -guarding or rigidity.  9.  No Cyanosis, Normal Skin Turgor, No Skin Rash or Bruise.  10. Good muscle tone,  joints appear normal , no effusions, Normal ROM.  11. No Palpable Lymph Nodes in Neck or Axillae    Data Review:    CBC Recent Labs  Lab 06/22/21 1303  WBC 6.4  HGB 13.4  HCT 40.5  PLT 271  MCV 90.4  MCH 29.9  MCHC 33.1  RDW 13.1  LYMPHSABS 0.7  MONOABS 0.4  EOSABS 0.1  BASOSABS 0.0   ------------------------------------------------------------------------------------------------------------------  Chemistries  Recent Labs  Lab 06/22/21 1303  NA 138  K 3.7  CL 106  CO2 26  GLUCOSE 110*  BUN 7*  CREATININE 0.79  CALCIUM 10.1  AST 15  ALT 15  ALKPHOS 63  BILITOT 0.3   ------------------------------------------------------------------------------------------------------------------ estimated creatinine clearance is 48.4 mL/min (by C-G formula based on SCr of 0.79 mg/dL). ------------------------------------------------------------------------------------------------------------------ No results for input(s): TSH, T4TOTAL, T3FREE, THYROIDAB in the last 72 hours.  Invalid input(s): FREET3  Coagulation profile Recent Labs  Lab 06/22/21 1303  INR 1.0   ------------------------------------------------------------------------------------------------------------------- No results for input(s): DDIMER in the last  72 hours. -------------------------------------------------------------------------------------------------------------------  Cardiac Enzymes No results for input(s): CKMB, TROPONINI, MYOGLOBIN in the last 168 hours.  Invalid input(s): CK ------------------------------------------------------------------------------------------------------------------ No results found for: BNP   ---------------------------------------------------------------------------------------------------------------  Urinalysis    Component Value Date/Time   COLORURINE COLORLESS (A) 06/22/2021 1243   APPEARANCEUR CLEAR 06/22/2021 1243   APPEARANCEUR Clear 09/24/2020 0848   LABSPEC 1.003 (L) 06/22/2021 1243   PHURINE 7.0 06/22/2021 1243   GLUCOSEU NEGATIVE 06/22/2021 1243   HGBUR SMALL (A) 06/22/2021 1243   BILIRUBINUR NEGATIVE 06/22/2021 1243   BILIRUBINUR Negative 09/24/2020 0848   KETONESUR NEGATIVE 06/22/2021 1243   PROTEINUR NEGATIVE 06/22/2021 1243   NITRITE NEGATIVE 06/22/2021 1243   LEUKOCYTESUR NEGATIVE 06/22/2021 1243    ----------------------------------------------------------------------------------------------------------------   Imaging Results:    CT HEAD CODE STROKE WO CONTRAST  Result Date: 06/22/2021 CLINICAL DATA:  Code stroke. Abnormal speech which has since resolved. Right-sided weakness. EXAM: CT HEAD WITHOUT CONTRAST TECHNIQUE: Contiguous axial images were obtained from the base of the skull through the vertex without intravenous contrast. COMPARISON:  None. FINDINGS: Brain: Asymmetric hypoattenuation is present in the posterior limb left internal capsule. This is nonspecific and could be chronic. Other scattered white matter changes are present bilaterally. Basal ganglia are otherwise intact. Insular ribbon is intact bilaterally. No acute cortical infarct is present. No hemorrhage or mass lesion is present. Hypoattenuation involving the pons, asymmetric on the left, is likely  artifactual. Posterior fossa is otherwise unremarkable. Vascular: No hyperdense vessel or unexpected calcification. Skull: Calvarium is intact. No focal lytic or blastic lesions are present. No significant extracranial soft tissue lesion is present. Sinuses/Orbits: The paranasal sinuses and mastoid air cells are clear. ASPECTS [facility] Stroke Program Early CT Score) - Ganglionic level infarction (caudate, lentiform nuclei, internal capsule, insula, M1-M3 cortex): 6/7 - Supraganglionic infarction (M4-M6 cortex): 3/3 Total score (0-10 with 10 being normal): 10/10 IMPRESSION: 1. Asymmetric hypoattenuation in the posterior limb left internal capsule is  nonspecific. While this may be chronic white matter ischemia, acute infarction is not excluded. 2. Other scattered white matter changes are present bilaterally. This likely reflects the sequela of chronic microvascular ischemia. 3. No acute cortical infarct or hemorrhage. 4. ASPECTS is 9/10. These results were called by telephone at the time of interpretation on 06/22/2021 at 1:09 pm to provider Tanda Rockers , who verbally acknowledged these results. Electronically Signed   By: Marin Roberts M.D.   On: 06/22/2021 13:09    My personal review of EKG: Rhythm NSR,  Vent. rate 104 BPM PR interval 139 ms QRS duration 84 ms QT/QTcB 316/416 ms P-R-T axes 79 95 20 Sinus tachycardia Right axis deviation RSR' in V1 or V2, probably normal variant Borderline ST depression, lateral leads  Assessment & Plan:    Active Problems:   Hypothyroidism   Hyperlipidemia   Essential hypertension   TIA (transient ischemic attack)   TIA -Patient presents with right side weakness, symptoms currently resolved, so she is not a candidate for thrombolytics at this point. -Patient will be admitted to [facility] need MRI this weekend, and neurology to follow over the weekend as well.  Telemetry stroke recommendation. -Full dose aspirin for now, then continue baby  aspirin. - will obtain MRI brain - will obtain CTA head and neck-we will obtain 2D echo - will monitor on telemetry -PT/OT/SLP consult -Allow for permissive hypertension. -Check lipid panel and A1c  Hypertension -Allow for permissive hypertension, hold home medications  Hyperlipidemia -Not on any statin, will check lipid panel, will start statin if appropriate  Hypothyroidism- -Continue with home dose Synthroid     DVT Prophylaxis   Lovenox   AM Labs Ordered, also please review Full Orders  Family Communication: Admission, patients condition and plan of care including tests being ordered have been discussed with the patient  who indicate understanding and agree with the plan and Code Status.  Code Status Full  Likely DC to  home  Condition GUARDED    Consults called: Tele stroke seen in [facility]    Admission status: observation    Time spent in minutes : 55 minutes   Huey Bienenstock M.D on 06/22/2021 at 4:44 PM   [facility] - Office  [phone]

## 2021-06-22 NOTE — ED Provider Notes (Signed)
[facility] EMERGENCY DEPARTMENT Provider Note   CSN: [id number] Arrival date & time: 06/22/21  1218  An emergency department physician performed an initial assessment on this suspected stroke patient at 1237.  History Chief Complaint  Patient presents with   Code Stroke    [name] is a 74 y.o. female.  This is a 75 y.o. female with significant medical history as below, including HTN, HLD, asthma who presents to the ED with complaint of right arm weakness, speech difficulty, difficulty writing. Last known normal 11:45 am. Speech mildly improved since onset per friend at bedside. She reports feeling fatigued over the last week caring for ill family member.    Level 5 caveat, acuity of condition.   The history is provided by the patient and a relative. No language interpreter was used.      Past Medical History:  Diagnosis Date   Allergy    seasonal allergies   Asthma    uses inhaler    Colon polyps    GERD (gastroesophageal reflux disease)    with certain foods   Hyperlipidemia    on meds   Hypertension    on meds   Menopausal state    Osteopenia    Rhinitis    Thyroid disease    on meds   Vitamin D deficiency disease     Patient Active Problem List   Diagnosis Date Noted   TIA (transient ischemic attack) 06/22/2021   Asthma, chronic, moderate persistent, uncomplicated 01/24/2020   Essential hypertension 05/18/2019   Abnormal EKG 05/18/2019   Osteopenia 03/11/2013   Hyperlipidemia 03/11/2013   Hypothyroidism 11/22/2012   Asthma, chronic 11/22/2012    Past Surgical History:  Procedure Laterality Date   CHOLECYSTECTOMY  2005   COLONOSCOPY  2099   CG-F/V-miralax(exc)-TA   TONSILLECTOMY       OB History   No obstetric history on file.     Family History  Problem Relation Age of Onset   Hyperlipidemia Mother    Hypertension Mother    Thyroid disease Mother    Osteoporosis Mother    Colon polyps Mother    Colon cancer Mother 35   AAA  (abdominal aortic aneurysm) Mother    Heart disease Father    Heart attack Father        heavy smoker   Hypertension Sister    Gout Sister    Hypertension Brother    Asthma Brother    Esophageal cancer Neg Hx    Stomach cancer Neg Hx     Social History   Tobacco Use   Smoking status: Never   Smokeless tobacco: Never  Vaping Use   Vaping Use: Never used  Substance Use Topics   Alcohol use: No   Drug use: No    Home Medications Prior to Admission medications   Medication Sig Start Date End Date Taking? Authorizing Provider  albuterol (VENTOLIN HFA) 108 (90 Base) MCG/ACT inhaler Inhale 2 puffs into the lungs every 6 (six) hours as needed for wheezing. 01/24/20  Yes Stacks, Broadus John, MD  amLODipine (NORVASC) 5 MG tablet Take 1 tablet (5 mg total) by mouth daily. 03/27/21  Yes Mechele Claude, MD  fluticasone (FLONASE) 50 MCG/ACT nasal spray Use 2 spray(s) in each nostril once daily Patient taking differently: Place 2 sprays into both nostrils every evening. Use 2 spray(s) in each nostril once daily 08/29/20  Yes Stacks, Broadus John, MD  fluticasone-salmeterol (ADVAIR) 100-50 MCG/ACT AEPB Inhale 1 puff into the lungs 2 (two)  times daily. 03/27/21  Yes Stacks, Broadus John, MD  levothyroxine (EUTHYROX) 75 MCG tablet Take 1 tablet (75 mcg total) by mouth daily. 03/27/21  Yes Mechele Claude, MD  lisinopril (ZESTRIL) 20 MG tablet Take 1 tablet (20 mg total) by mouth daily. 03/27/21  Yes Mechele Claude, MD  alendronate (FOSAMAX) 70 MG tablet Take 1 tablet (70 mg total) by mouth every 7 (seven) days. Take with a full glass of water on an empty stomach. Patient not taking: No sig reported 03/27/21   Mechele Claude, MD  Cholecalciferol (VITAMIN D3) 3000 UNITS TABS Take 2,000 Units by mouth daily.  Patient not taking: No sig reported    [provider]  hydrocortisone (ANUSOL-HC) 25 MG suppository Place rectally 2 (two) times daily as needed. Patient not taking: No sig reported 07/30/20 07/30/21  [provider]  Multiple Vitamin (MULTIVITAMIN WITH MINERALS) TABS Take 1 tablet by mouth daily. Patient not taking: No sig reported    [provider]  pantoprazole (PROTONIX) 40 MG tablet Take 1 tablet (40 mg total) by mouth daily. For stomach Patient not taking: No sig reported 08/29/20   Mechele Claude, MD    Allergies    Evista [raloxifene hcl], Fosamax [alendronate sodium], Red dye, and Sulfa antibiotics  Review of Systems   Review of Systems  Unable to perform ROS: Acuity of condition  Constitutional:  Negative for chills and fever.  Eyes:  Negative for visual disturbance.  Respiratory:  Negative for shortness of breath.   Cardiovascular:  Negative for chest pain.  Gastrointestinal:  Negative for abdominal pain.  Musculoskeletal:  Negative for gait problem.  Neurological:  Positive for speech difficulty, weakness and numbness.       Agraphia    Physical Exam Updated Vital Signs BP (!) 158/54   Pulse 94   Temp 97.7 F (36.5 C) (Oral)   Resp 13   Wt 58.6 kg   SpO2 100%   BMI 24.41 kg/m   Physical Exam Vitals and nursing note reviewed.  Constitutional:      General: She is not in acute distress.    Appearance: Normal appearance.  HENT:     Head: Normocephalic and atraumatic.     Right Ear: External ear normal.     Left Ear: External ear normal.     Nose: Nose normal.     Mouth/Throat:     Mouth: Mucous membranes are moist.  Eyes:     General: No scleral icterus.       Right eye: No discharge.        Left eye: No discharge.     Extraocular Movements: Extraocular movements intact.     Pupils: Pupils are equal, round, and reactive to light.  Cardiovascular:     Rate and Rhythm: Normal rate and regular rhythm.     Pulses: Normal pulses.     Heart sounds: Normal heart sounds.  Pulmonary:     Effort: Pulmonary effort is normal. No respiratory distress.     Breath sounds: Normal breath sounds.  Abdominal:     General: Abdomen is flat.     Tenderness:  There is no abdominal tenderness.  Musculoskeletal:        General: Normal range of motion.     Cervical back: Normal range of motion.     Right lower leg: No edema.     Left lower leg: No edema.  Skin:    General: Skin is warm and dry.     Capillary Refill:  Capillary refill takes less than 2 seconds.  Neurological:     Mental Status: She is alert and oriented to person, place, and time.     GCS: GCS eye subscore is 4. GCS verbal subscore is 5. GCS motor subscore is 6.     Cranial Nerves: Cranial nerves 2-12 are intact. No facial asymmetry.     Sensory: Sensory deficit present.     Motor: Weakness present.     Coordination: Coordination is intact. Finger-Nose-Finger Test normal.     Comments: Right UE with reduced sensation. Right UE 4/5 strength, left UE is 5/5. 2+ radial and DP pulses equal/symmetric. Full sensation to shoulders. NIHSS 1, agraphia. No visual field abnormality   Psychiatric:        Mood and Affect: Mood normal.        Behavior: Behavior normal.    ED Results / Procedures / Treatments   Labs (all labs ordered are listed, but only abnormal results are displayed) Labs Reviewed  COMPREHENSIVE METABOLIC PANEL - Abnormal; Notable for the following components:      Result Value   Glucose, Bld 110 (*)    BUN 7 (*)    All other components within normal limits  URINALYSIS, ROUTINE W REFLEX MICROSCOPIC - Abnormal; Notable for the following components:   Color, Urine COLORLESS (*)    Specific Gravity, Urine 1.003 (*)    Hgb urine dipstick SMALL (*)    All other components within normal limits  CBG MONITORING, ED - Abnormal; Notable for the following components:   Glucose-Capillary 102 (*)    All other components within normal limits  RESP PANEL BY RT-PCR (FLU A&B, COVID) ARPGX2  ETHANOL  PROTIME-INR  APTT  CBC  DIFFERENTIAL  RAPID URINE DRUG SCREEN, HOSP PERFORMED  I-STAT CHEM 8, ED    EKG EKG Interpretation  Date/Time:  Saturday June 22 2021 12:43:57  EDT Ventricular Rate:  104 PR Interval:  139 QRS Duration: 84 QT Interval:  316 QTC Calculation: 416 R Axis:   95 Text Interpretation: Sinus tachycardia Right axis deviation No Stemi Confirmed by Tanda Rockers ([id number]) on 06/22/2021 4:34:13 PM  Radiology CT HEAD CODE STROKE WO CONTRAST  Result Date: 06/22/2021 CLINICAL DATA:  Code stroke. Abnormal speech which has since resolved. Right-sided weakness. EXAM: CT HEAD WITHOUT CONTRAST TECHNIQUE: Contiguous axial images were obtained from the base of the skull through the vertex without intravenous contrast. COMPARISON:  None. FINDINGS: Brain: Asymmetric hypoattenuation is present in the posterior limb left internal capsule. This is nonspecific and could be chronic. Other scattered white matter changes are present bilaterally. Basal ganglia are otherwise intact. Insular ribbon is intact bilaterally. No acute cortical infarct is present. No hemorrhage or mass lesion is present. Hypoattenuation involving the pons, asymmetric on the left, is likely artifactual. Posterior fossa is otherwise unremarkable. Vascular: No hyperdense vessel or unexpected calcification. Skull: Calvarium is intact. No focal lytic or blastic lesions are present. No significant extracranial soft tissue lesion is present. Sinuses/Orbits: The paranasal sinuses and mastoid air cells are clear. ASPECTS [facility] Stroke Program Early CT Score) - Ganglionic level infarction (caudate, lentiform nuclei, internal capsule, insula, M1-M3 cortex): 6/7 - Supraganglionic infarction (M4-M6 cortex): 3/3 Total score (0-10 with 10 being normal): 10/10 IMPRESSION: 1. Asymmetric hypoattenuation in the posterior limb left internal capsule is nonspecific. While this may be chronic white matter ischemia, acute infarction is not excluded. 2. Other scattered white matter changes are present bilaterally. This likely reflects the sequela of chronic microvascular ischemia. 3.  No acute cortical infarct or hemorrhage. 4.  ASPECTS is 9/10. These results were called by telephone at the time of interpretation on 06/22/2021 at 1:09 pm to provider Tanda Rockers , who verbally acknowledged these results. Electronically Signed   By: Marin Roberts M.D.   On: 06/22/2021 13:09    Procedures .Critical Care Performed by: Sloan Leiter, DO Authorized by: Sloan Leiter, DO   Critical care provider statement:    Critical care time (minutes):  39   Critical care time was exclusive of:  Separately billable procedures and treating other patients   Critical care was necessary to treat or prevent imminent or life-threatening deterioration of the following conditions:  CNS failure or compromise   Critical care was time spent personally by me on the following activities:  Ordering and performing treatments and interventions, ordering and review of laboratory studies, ordering and review of radiographic studies, pulse oximetry, re-evaluation of patient's condition, review of old charts, obtaining history from patient or surrogate, examination of patient, evaluation of patient's response to treatment and development of treatment plan with patient or surrogate   Care discussed with: admitting provider   Comments:     CVA symptoms within the TNK window, thrombolytics were considered. Multiple neuro checks, discussion with consultants and admitting team.    Medications Ordered in ED Medications - No data to display  ED Course  I have reviewed the triage vital signs and the nursing notes.  Pertinent labs & imaging results that were available during my care of the patient were reviewed by me and considered in my medical decision making (see chart for details).  Clinical Course as of 06/22/21 1635  Sat Jun 22, 2021  1318 Nihss q30, vitals q15  Symptoms resolved, NIHSS is now 0  D/w neurology, recommend admit for stroke/tia w/u. Will need txfr to [facility] for MRI at 4 1/2 hrs after symptom onset. [SG]    Clinical Course User  Index [SG] Sloan Leiter, DO   MDM Rules/Calculators/A&P                           CC: right sided weakness  This patient complains of symptoms as above; this involves an extensive number of treatment options and is a complaint that carries with it a high risk of complications and morbidity. Vital signs were reviewed. Serious etiologies considered.  Right UE weakness, sensation deficit, agraphia, reported dysarthria however this has resolved per friend at bedside. Friend unable to described quality of impaired speech, reports it was "just different."  Last known normal was around 11:45 AM, she is within TNK window, stroke team alerted.  Record review:  Previous records obtained and reviewed   Additional history obtained from friend at bedside  Work up as above, notable for:   Labs & imaging results that were available during my care of the patient were reviewed by me and considered in my medical decision making.   I ordered imaging studies which included CT stroke  and I independently visualized and interpreted imaging which showed findings as above   Reassessment:  Upon returning from CT patient's repeat neuro exam is non-focal, NIHSS 0. She feels her symptoms have resolved, she is able to write her name appropriately. D/w neurology recommend admission for stroke/tia workup with MRI once she is outside of the TNK window. Not available at this facility, plan to admit to [facility] for MRI, remainder of stroke workup.   D/w [facility]  who accepts admission. Dr Freddrick March         This chart was dictated using voice recognition software.  Despite best efforts to proofread,  errors can occur which can change the documentation meaning.  Final Clinical Impression(s) / ED Diagnoses Final diagnoses:  Slurred speech  Numbness  Agraphia    Rx / DC Orders ED Discharge Orders     None        Sloan Leiter, DO 06/22/21 1635

## 2021-06-22 NOTE — ED Notes (Signed)
Patient takes a generic form of advair inhaler for asthma. States she feels dulera inhaler exacerbates her asthma. Inhaler has already been given before this was known.

## 2021-06-23 ENCOUNTER — Observation Stay (HOSPITAL_COMMUNITY): Payer: Medicare Other

## 2021-06-23 ENCOUNTER — Other Ambulatory Visit: Payer: Self-pay

## 2021-06-23 ENCOUNTER — Observation Stay (HOSPITAL_BASED_OUTPATIENT_CLINIC_OR_DEPARTMENT_OTHER): Payer: Medicare Other

## 2021-06-23 DIAGNOSIS — I10 Essential (primary) hypertension: Secondary | ICD-10-CM | POA: Diagnosis not present

## 2021-06-23 DIAGNOSIS — Z79899 Other long term (current) drug therapy: Secondary | ICD-10-CM | POA: Diagnosis not present

## 2021-06-23 DIAGNOSIS — E039 Hypothyroidism, unspecified: Secondary | ICD-10-CM | POA: Diagnosis not present

## 2021-06-23 DIAGNOSIS — Z20822 Contact with and (suspected) exposure to covid-19: Secondary | ICD-10-CM | POA: Diagnosis not present

## 2021-06-23 DIAGNOSIS — E782 Mixed hyperlipidemia: Secondary | ICD-10-CM

## 2021-06-23 DIAGNOSIS — G459 Transient cerebral ischemic attack, unspecified: Secondary | ICD-10-CM | POA: Diagnosis not present

## 2021-06-23 DIAGNOSIS — Z8673 Personal history of transient ischemic attack (TIA), and cerebral infarction without residual deficits: Secondary | ICD-10-CM | POA: Diagnosis not present

## 2021-06-23 DIAGNOSIS — I639 Cerebral infarction, unspecified: Secondary | ICD-10-CM | POA: Diagnosis not present

## 2021-06-23 DIAGNOSIS — Y900 Blood alcohol level of less than 20 mg/100 ml: Secondary | ICD-10-CM | POA: Diagnosis not present

## 2021-06-23 DIAGNOSIS — R29898 Other symptoms and signs involving the musculoskeletal system: Secondary | ICD-10-CM | POA: Diagnosis not present

## 2021-06-23 DIAGNOSIS — G319 Degenerative disease of nervous system, unspecified: Secondary | ICD-10-CM | POA: Diagnosis not present

## 2021-06-23 DIAGNOSIS — J45909 Unspecified asthma, uncomplicated: Secondary | ICD-10-CM | POA: Diagnosis not present

## 2021-06-23 DIAGNOSIS — R531 Weakness: Secondary | ICD-10-CM | POA: Diagnosis not present

## 2021-06-23 LAB — CBC
HCT: 36.9 % (ref 36.0–46.0)
Hemoglobin: 12.2 g/dL (ref 12.0–15.0)
MCH: 30.3 pg (ref 26.0–34.0)
MCHC: 33.1 g/dL (ref 30.0–36.0)
MCV: 91.6 fL (ref 80.0–100.0)
Platelets: 288 10*3/uL (ref 150–400)
RBC: 4.03 MIL/uL (ref 3.87–5.11)
RDW: 13.2 % (ref 11.5–15.5)
WBC: 6.6 10*3/uL (ref 4.0–10.5)
nRBC: 0 % (ref 0.0–0.2)

## 2021-06-23 LAB — COMPREHENSIVE METABOLIC PANEL
ALT: 12 U/L (ref 0–44)
AST: 12 U/L — ABNORMAL LOW (ref 15–41)
Albumin: 3.3 g/dL — ABNORMAL LOW (ref 3.5–5.0)
Alkaline Phosphatase: 52 U/L (ref 38–126)
Anion gap: 6 (ref 5–15)
BUN: 9 mg/dL (ref 8–23)
CO2: 26 mmol/L (ref 22–32)
Calcium: 9.9 mg/dL (ref 8.9–10.3)
Chloride: 106 mmol/L (ref 98–111)
Creatinine, Ser: 0.78 mg/dL (ref 0.44–1.00)
GFR, Estimated: >60 mL/min (ref >60)
Glucose, Bld: 105 mg/dL — ABNORMAL HIGH (ref 70–99)
Potassium: 3.4 mmol/L — ABNORMAL LOW (ref 3.5–5.1)
Sodium: 138 mmol/L (ref 135–145)
Total Bilirubin: 0.2 mg/dL — ABNORMAL LOW (ref 0.3–1.2)
Total Protein: 6.5 g/dL (ref 6.5–8.1)

## 2021-06-23 LAB — I-STAT CHEM 8, ED
BUN: 6 mg/dL — ABNORMAL LOW (ref 8–23)
Calcium, Ion: 1.27 mmol/L (ref 1.15–1.40)
Chloride: 106 mmol/L (ref 98–111)
Creatinine, Ser: 0.8 mg/dL (ref 0.44–1.00)
Glucose, Bld: 107 mg/dL — ABNORMAL HIGH (ref 70–99)
HCT: 41 % (ref 36.0–46.0)
Hemoglobin: 13.9 g/dL (ref 12.0–15.0)
Potassium: 3.8 mmol/L (ref 3.5–5.1)
Sodium: 139 mmol/L (ref 135–145)
TCO2: 26 mmol/L (ref 22–32)

## 2021-06-23 LAB — HEMOGLOBIN A1C
Hgb A1c MFr Bld: 5.1 % (ref 4.8–5.6)
Mean Plasma Glucose: 99.67 mg/dL

## 2021-06-23 LAB — LIPID PANEL
Cholesterol: 183 mg/dL (ref 0–200)
HDL: 63 mg/dL (ref >40)
LDL Cholesterol: 105 mg/dL — ABNORMAL HIGH (ref 0–99)
Total CHOL/HDL Ratio: 2.9 RATIO
Triglycerides: 76 mg/dL (ref <150)
VLDL: 15 mg/dL (ref 0–40)

## 2021-06-23 LAB — ECHOCARDIOGRAM COMPLETE
Area-P 1/2: 3.53 cm2
S' Lateral: 1.5 cm
Weight: 2067.03 oz

## 2021-06-23 MED ORDER — ATORVASTATIN CALCIUM 40 MG PO TABS
40.0000 mg | ORAL_TABLET | Freq: Every day | ORAL | Status: DC
  Administered 2021-06-23 – 2021-06-24 (×2): 40 mg via ORAL
  Filled 2021-06-23 (×2): qty 1

## 2021-06-23 MED ORDER — POTASSIUM CHLORIDE CRYS ER 20 MEQ PO TBCR
40.0000 meq | EXTENDED_RELEASE_TABLET | Freq: Once | ORAL | Status: AC
  Administered 2021-06-23: 40 meq via ORAL
  Filled 2021-06-23: qty 2

## 2021-06-23 MED ORDER — FLUTICASONE FUROATE-VILANTEROL 100-25 MCG/ACT IN AEPB
1.0000 | INHALATION_SPRAY | Freq: Every day | RESPIRATORY_TRACT | Status: DC
  Administered 2021-06-24: 1 via RESPIRATORY_TRACT
  Filled 2021-06-23: qty 28

## 2021-06-23 MED ORDER — CLOPIDOGREL BISULFATE 75 MG PO TABS
75.0000 mg | ORAL_TABLET | Freq: Every day | ORAL | Status: DC
  Administered 2021-06-23 – 2021-06-24 (×2): 75 mg via ORAL
  Filled 2021-06-23 (×2): qty 1

## 2021-06-23 NOTE — Progress Notes (Signed)
Patient arrived from [facility] via [place]; patient is alert and oriented; oriented her to room and unit routine;fall safety reviewed; bed alarmed, low and locked.

## 2021-06-23 NOTE — Progress Notes (Signed)
*  PRELIMINARY RESULTS* Echocardiogram 2D Echocardiogram has been performed.  Stacey Drain 06/23/2021, 10:33 AM

## 2021-06-23 NOTE — ED Notes (Signed)
Echo at bedside

## 2021-06-23 NOTE — ED Notes (Signed)
Physical Therapy at bedside.

## 2021-06-23 NOTE — ED Notes (Addendum)
Pt OOB to restroom without difficulties ambulating.

## 2021-06-23 NOTE — ED Notes (Signed)
Pt OOB to restroom.

## 2021-06-23 NOTE — Evaluation (Signed)
Physical Therapy Evaluation Patient Details Name: [name] MRN: [id number] DOB: 09/22/1945 Today's Date: 06/23/2021  History of Present Illness  75 year old female with a history of hypertension, hypothyroidism, presented with transient right-sided weakness and expressive aphasia.  She reports she was unable to form her words and also had difficulty writing with her right hand.  She reportedly had right upper and lower extremity weakness.  By the time she came to the emergency room, overall symptoms had improved.  CT head noted to be unremarkable.  She is being admitted to [facility] for further evaluation with MRI brain and may also need neurology input.   Clinical Impression  Patient sitting up in bed eager to participate in physical therapy. Able to perform all bed mobilities and transfers with independence. Able to walk without assistive device for 200 feet with supervision. Slight unsteadiness on feet but not loss of balance. Patient with reports of right hip pain and weakness at baseline and recommend outpatient PT for this. Patient with no acute PT needed a this time as she is functioning near baseline, patient is discharged from physical therapy to care of nursing for ambulation daily as tolerated for length of stay.       Recommendations for follow up therapy are one component of a multi-disciplinary discharge planning process, led by the attending physician.  Recommendations may be updated based on patient status, additional functional criteria and insurance authorization.  Follow Up Recommendations Outpatient PT (for right hip pain and weakness)    Assistance Recommended at Discharge Intermittent Supervision/Assistance  Functional Status Assessment    Equipment Recommendations  None recommended by PT    Recommendations for Other Services       Precautions / Restrictions Restrictions Weight Bearing Restrictions: No      Mobility  Bed Mobility Overal bed  mobility: Independent                  Transfers Overall transfer level: Independent                      Ambulation/Gait Ambulation/Gait assistance: Modified independent (Device/Increase time) Gait Distance (Feet): 200 Feet Assistive device: None Gait Pattern/deviations: Decreased stride length;Wide base of support Gait velocity: reduced      Stairs            Wheelchair Mobility    Modified Rankin (Stroke Patients Only)       Balance Overall balance assessment: Modified Independent (can march in place withotu UE support, can sit edge of bed without upper or lower extremity support)                                           Pertinent Vitals/Pain Pain Assessment: 0-10 Pain Score: 1  Pain Location: stomach and hip Pain Descriptors / Indicators: Aching    Home Living Family/patient expects to be discharged to:: Private residence Living Arrangements: Alone Available Help at Discharge: Family Type of Home: House Home Access: Stairs to enter   Secretary/administrator of Steps: 1   Home Layout: One level Home Equipment: Agricultural consultant (2 wheels);Cane - single point;Grab bars - tub/shower;Grab bars - toilet      Prior Function Prior Level of Function : Independent/Modified Independent;Driving               ADLs Comments: helps take care of brother  Hand Dominance        Extremity/Trunk Assessment        Lower Extremity Assessment Lower Extremity Assessment: Generalized weakness       Communication      Cognition Arousal/Alertness: Awake/alert Behavior During Therapy: WFL for tasks assessed/performed Overall Cognitive Status: Within Functional Limits for tasks assessed                                          General Comments      Exercises General Exercises - Lower Extremity Ankle Circles/Pumps: AROM;Strengthening;Both;10 reps;Seated Long Arc Quad: AROM;Strengthening;Both;10  reps;Seated Hip Flexion/Marching: AROM;Strengthening;Both;10 reps;Seated;Standing   Assessment/Plan    PT Assessment Patient does not need any further PT services  PT Problem List         PT Treatment Interventions      PT Goals (Current goals can be found in the Care Plan section)  Acute Rehab PT Goals Patient Stated Goal: to go home PT Goal Formulation: With patient Time For Goal Achievement: 07/07/21 Potential to Achieve Goals: Good    Frequency     Barriers to discharge        Co-evaluation               AM-PAC PT "6 Clicks" Mobility  Outcome Measure Help needed turning from your back to your side while in a flat bed without using bedrails?: None Help needed moving from lying on your back to sitting on the side of a flat bed without using bedrails?: None Help needed moving to and from a bed to a chair (including a wheelchair)?: None Help needed standing up from a chair using your arms (e.g., wheelchair or bedside chair)?: None Help needed to walk in hospital room?: None Help needed climbing 3-5 steps with a railing? : A Little 6 Click Score: 23    End of Session Equipment Utilized During Treatment: Gait belt Activity Tolerance: Patient tolerated treatment well Patient left: in bed;with call bell/phone within reach Nurse Communication: Mobility status PT Visit Diagnosis: Unsteadiness on feet (R26.81);Muscle weakness (generalized) (M62.81)    Time: 0867-6195 PT Time Calculation (min) (ACUTE ONLY): 12 min   Charges:   PT Evaluation $PT Eval Low Complexity: 1 Low        11:17 AM, 06/23/21 Tereasa Coop, DPT Physical Therapy with [facility]  [phone] office

## 2021-06-23 NOTE — Progress Notes (Signed)
PROGRESS NOTE    [name]  WUJ:[id number] DOB: Sep 05, 1945 DOA: 06/22/2021 PCP: Mechele Claude, MD    Brief Narrative:  75 year old female with a history of hypertension, hypothyroidism, presented with transient right-sided weakness and expressive aphasia.  She reports she was unable to form her words and also had difficulty writing with her right hand.  She reportedly had right upper and lower extremity weakness.  By the time she came to the emergency room, overall symptoms had improved.  CT head noted to be unremarkable.  She is being admitted to [facility] for further evaluation with MRI brain and may also need neurology input.   Assessment & Plan:   Active Problems:   Hypothyroidism   Hyperlipidemia   Essential hypertension   TIA (transient ischemic attack)   Transient ischemic attack -Patient presented with transient right-sided weakness and expressive aphasia.  She had difficulty expressing her words as well as writing with her right hand -Reports that symptoms lasted approximately 45 minutes before they resolved -CT head did not show any acute findings -She had a CT angiogram of the head and neck did not show any significant stenosis -Echocardiogram is being performed -She will need MRI brain and will likely need neurology input -Plans are to transfer to [facility] to undergo MRI and further neuro input -She is currently on aspirin -Not a candidate for tPA since her symptoms are resolved -PT/OT eval pending  Hypertension -Chronically on amlodipine and lisinopril -Holding home medications to allow for permissive hypertension  Hypothyroidism -Continue on Synthroid  Hyperlipidemia -LDL 105, will start on statin   DVT prophylaxis: enoxaparin (LOVENOX) injection 40 mg Start: 06/22/21 1800 SCD's Start: 06/22/21 1734  Code Status: Full code Family Communication: Discussed with patient Disposition Plan: Status is: Observation  The patient remains OBS  appropriate and will d/c before 2 midnights.        Consultants:    Procedures:  Echo  Antimicrobials:      Subjective: She feels that speech has returned back to baseline.  Overall weakness on her right side is also better.  Objective: Vitals:   06/23/21 0530 06/23/21 0600 06/23/21 0630 06/23/21 0700  BP: (!) 128/52 136/63 (!) 153/65 (!) 148/60  Pulse: 76 90 (!) 102 81  Resp: 13 14 13 12   Temp:      TempSrc:      SpO2: 96% 97% 97% 97%  Weight:       No intake or output data in the 24 hours ending 06/23/21 0940 Filed Weights   06/22/21 1241  Weight: 58.6 kg    Examination:  General exam: Appears calm and comfortable  Respiratory system: Clear to auscultation. Respiratory effort normal. Cardiovascular system: S1 & S2 heard, RRR. No JVD, murmurs, rubs, gallops or clicks. No pedal edema. Gastrointestinal system: Abdomen is nondistended, soft and nontender. No organomegaly or masses felt. Normal bowel sounds heard. Central nervous system: Alert and oriented. No focal neurological deficits. Extremities: Symmetric 5 x 5 power. Skin: No rashes, lesions or ulcers Psychiatry: Judgement and insight appear normal. Mood & affect appropriate.     Data Reviewed: I have personally reviewed following labs and imaging studies  CBC: Recent Labs  Lab 06/22/21 1303 06/23/21 0244  WBC 6.4 6.6  NEUTROABS 5.2  --   HGB 13.4 12.2  HCT 40.5 36.9  MCV 90.4 91.6  PLT 271 288   Basic Metabolic Panel: Recent Labs  Lab 06/22/21 1303 06/23/21 0244  NA 138 138  K 3.7 3.4*  CL 106 106  CO2 26 26  GLUCOSE 110* 105*  BUN 7* 9  CREATININE 0.79 0.78  CALCIUM 10.1 9.9   GFR: Estimated Creatinine Clearance: 48.4 mL/min (by C-G formula based on SCr of 0.78 mg/dL). Liver Function Tests: Recent Labs  Lab 06/22/21 1303 06/23/21 0244  AST 15 12*  ALT 15 12  ALKPHOS 63 52  BILITOT 0.3 0.2*  PROT 7.8 6.5  ALBUMIN 4.0 3.3*   No results for input(s): LIPASE, AMYLASE in  the last 168 hours. No results for input(s): AMMONIA in the last 168 hours. Coagulation Profile: Recent Labs  Lab 06/22/21 1303  INR 1.0   Cardiac Enzymes: No results for input(s): CKTOTAL, CKMB, CKMBINDEX, TROPONINI in the last 168 hours. BNP (last 3 results) No results for input(s): PROBNP in the last 8760 hours. HbA1C: No results for input(s): HGBA1C in the last 72 hours. CBG: Recent Labs  Lab 06/22/21 1242  GLUCAP 102*   Lipid Profile: Recent Labs    06/23/21 0244  CHOL 183  HDL 63  LDLCALC 105*  TRIG 76  CHOLHDL 2.9   Thyroid Function Tests: No results for input(s): TSH, T4TOTAL, FREET4, T3FREE, THYROIDAB in the last 72 hours. Anemia Panel: No results for input(s): VITAMINB12, FOLATE, FERRITIN, TIBC, IRON, RETICCTPCT in the last 72 hours. Sepsis Labs: No results for input(s): PROCALCITON, LATICACIDVEN in the last 168 hours.  Recent Results (from the past 240 hour(s))  Resp Panel by RT-PCR (Flu A&B, Covid) Nasopharyngeal Swab     Status: None   Collection Time: 06/22/21  1:41 PM   Specimen: Nasopharyngeal Swab; Nasopharyngeal(NP) swabs in vial transport medium  Result Value Ref Range Status   SARS Coronavirus 2 by RT PCR NEGATIVE NEGATIVE Final    Comment: (NOTE) SARS-CoV-2 target nucleic acids are NOT DETECTED.  The SARS-CoV-2 RNA is generally detectable in upper respiratory specimens during the acute phase of infection. The lowest concentration of SARS-CoV-2 viral copies this assay can detect is 138 copies/mL. A negative result does not preclude SARS-Cov-2 infection and should not be used as the sole basis for treatment or other patient management decisions. A negative result may occur with  improper specimen collection/handling, submission of specimen other than nasopharyngeal swab, presence of viral mutation(s) within the areas targeted by this assay, and inadequate number of viral copies(<138 copies/mL). A negative result must be combined with clinical  observations, patient history, and epidemiological information. The expected result is Negative.  Fact Sheet for Patients:  BloggerCourse.com  Fact Sheet for Healthcare Providers:  SeriousBroker.it  This test is no t yet approved or cleared by the [place] FDA and  has been authorized for detection and/or diagnosis of SARS-CoV-2 by FDA under an Emergency Use Authorization (EUA). This EUA will remain  in effect (meaning this test can be used) for the duration of the COVID-19 declaration under Section 564(b)(1) of the Act, 21 U.S.C.section 360bbb-3(b)(1), unless the authorization is terminated  or revoked sooner.       Influenza A by PCR NEGATIVE NEGATIVE Final   Influenza B by PCR NEGATIVE NEGATIVE Final    Comment: (NOTE) The Xpert Xpress SARS-CoV-2/FLU/RSV plus assay is intended as an aid in the diagnosis of influenza from Nasopharyngeal swab specimens and should not be used as a sole basis for treatment. Nasal washings and aspirates are unacceptable for Xpert Xpress SARS-CoV-2/FLU/RSV testing.  Fact Sheet for Patients: BloggerCourse.com  Fact Sheet for Healthcare Providers: SeriousBroker.it  This test is not yet approved or cleared by the [place] FDA and has been authorized for detection and/or diagnosis of SARS-CoV-2 by FDA under an Emergency Use Authorization (EUA). This EUA will remain in effect (meaning this test can be used) for the duration of the COVID-19 declaration under Section 564(b)(1) of the Act, 21 U.S.C. section 360bbb-3(b)(1), unless the authorization is terminated or revoked.  Performed at [facility], [address], [place], [place] [address]          Radiology Studies: CT ANGIO HEAD NECK W WO CM  Result Date: 06/22/2021 CLINICAL DATA:  Abnormal speech.  Right-sided weakness. EXAM: CT ANGIOGRAPHY HEAD AND NECK TECHNIQUE: Multidetector  CT imaging of the head and neck was performed using the standard protocol during bolus administration of intravenous contrast. Multiplanar CT image reconstructions and MIPs were obtained to evaluate the vascular anatomy. Carotid stenosis measurements (when applicable) are obtained utilizing NASCET criteria, using the distal internal carotid diameter as the denominator. CONTRAST:  39mL OMNIPAQUE IOHEXOL 350 MG/ML SOLN COMPARISON:  CT head without contrast 06/22/2021 FINDINGS: CTA NECK FINDINGS Aortic arch: A 3 vessel arch configuration is present. No significant stenosis or aneurysm is present. Right carotid system: The right common carotid artery is within normal limits. Bifurcation is unremarkable. Mild tortuosity is present in the cervical right ICA without significant stenosis. Left carotid system: The left common carotid artery is within normal limits. Bifurcation is unremarkable. Mild tortuosity is present in the proximal cervical left ICA without significant stenosis. Vertebral arteries: The left vertebral artery is the dominant vessel. Both vertebral arteries originate from the subclavian arteries without significant stenosis. Skeleton: Vertebral body heights are maintained. 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 Slight anterolisthesis is present at C3-4 and C4-5. Endplate changes are noted at C5-6 and C6-7. This results in foraminal narrowing, greater on the left at C4-5, C5-6, and C6-7. Other neck: Soft tissues the neck are otherwise unremarkable. Salivary glands are within normal limits. Thyroid is normal. No significant adenopathy is present. No focal mucosal or submucosal lesions are present. Upper chest: The lung apices are clear. Thoracic inlet is within normal limits. Review of the MIP images confirms the above findings CTA HEAD FINDINGS Anterior circulation: Internal carotid arteries are within normal limits through the ICA termini bilaterally. The A1 and M1 segments are normal. The anterior  communicating artery is patent. MCA bifurcations are intact. The ACA and MCA branch vessels are within normal limits. Posterior circulation: The left vertebral artery is the dominant vessel. The right vertebral artery is hypoplastic. Both vertebral arteries originate from the subclavian arteries without significant stenosis. No significant stenosis or occlusion is present in either vertebral artery in the neck. Venous sinuses: The dural sinuses are patent. The straight sinus deep cerebral veins are intact. Cortical veins are within normal limits. No significant vascular malformation is evident. Anatomic variants: None Review of the MIP images confirms the above findings IMPRESSION: 1. No significant proximal stenosis, aneurysm, or branch vessel occlusion within the Circle of Willis. 2. Mild tortuosity of the cervical internal carotid arteries bilaterally without significant stenosis. 3. Multilevel spondylosis of the cervical spine. Electronically Signed   By: Marin Roberts M.D.   On: 06/22/2021 18:46   CT HEAD CODE STROKE WO CONTRAST  Result Date: 06/22/2021 CLINICAL DATA:  Code stroke. Abnormal speech which has since resolved. Right-sided weakness. EXAM: CT HEAD WITHOUT CONTRAST TECHNIQUE: Contiguous axial images were obtained from the base of the skull through the vertex without intravenous contrast. COMPARISON:  None. FINDINGS: Brain: Asymmetric hypoattenuation is present in the posterior limb  left internal capsule. This is nonspecific and could be chronic. Other scattered white matter changes are present bilaterally. Basal ganglia are otherwise intact. Insular ribbon is intact bilaterally. No acute cortical infarct is present. No hemorrhage or mass lesion is present. Hypoattenuation involving the pons, asymmetric on the left, is likely artifactual. Posterior fossa is otherwise unremarkable. Vascular: No hyperdense vessel or unexpected calcification. Skull: Calvarium is intact. No focal lytic or  blastic lesions are present. No significant extracranial soft tissue lesion is present. Sinuses/Orbits: The paranasal sinuses and mastoid air cells are clear. ASPECTS [facility] Stroke Program Early CT Score) - Ganglionic level infarction (caudate, lentiform nuclei, internal capsule, insula, M1-M3 cortex): 6/7 - Supraganglionic infarction (M4-M6 cortex): 3/3 Total score (0-10 with 10 being normal): 10/10 IMPRESSION: 1. Asymmetric hypoattenuation in the posterior limb left internal capsule is nonspecific. While this may be chronic white matter ischemia, acute infarction is not excluded. 2. Other scattered white matter changes are present bilaterally. This likely reflects the sequela of chronic microvascular ischemia. 3. No acute cortical infarct or hemorrhage. 4. ASPECTS is 9/10. These results were called by telephone at the time of interpretation on 06/22/2021 at 1:09 pm to provider Tanda Rockers , who verbally acknowledged these results. Electronically Signed   By: Marin Roberts M.D.   On: 06/22/2021 13:09        Scheduled Meds:   stroke: mapping our early stages of recovery book   Does not apply Once   aspirin EC  81 mg Oral Daily   enoxaparin (LOVENOX) injection  40 mg Subcutaneous Q24H   levothyroxine  75 mcg Oral Daily   mometasone-formoterol  2 puff Inhalation BID   pantoprazole  40 mg Oral Daily   Continuous Infusions:   LOS: 0 days    Time spent:    Erick Blinks, MD [facility]   If 7PM-7AM, please contact night-coverage [email or web address]  06/23/2021, 9:40 AM

## 2021-06-23 NOTE — ED Notes (Signed)
[facility] called at this time to setup transportation. 

## 2021-06-24 ENCOUNTER — Other Ambulatory Visit (HOSPITAL_COMMUNITY): Payer: Self-pay

## 2021-06-24 DIAGNOSIS — I639 Cerebral infarction, unspecified: Secondary | ICD-10-CM | POA: Diagnosis not present

## 2021-06-24 DIAGNOSIS — E7800 Pure hypercholesterolemia, unspecified: Secondary | ICD-10-CM

## 2021-06-24 DIAGNOSIS — I10 Essential (primary) hypertension: Secondary | ICD-10-CM | POA: Diagnosis not present

## 2021-06-24 DIAGNOSIS — E039 Hypothyroidism, unspecified: Secondary | ICD-10-CM | POA: Diagnosis not present

## 2021-06-24 DIAGNOSIS — I63312 Cerebral infarction due to thrombosis of left middle cerebral artery: Secondary | ICD-10-CM | POA: Diagnosis not present

## 2021-06-24 LAB — CBC
HCT: 37.5 % (ref 36.0–46.0)
Hemoglobin: 12 g/dL (ref 12.0–15.0)
MCH: 30.2 pg (ref 26.0–34.0)
MCHC: 32 g/dL (ref 30.0–36.0)
MCV: 94.2 fL (ref 80.0–100.0)
Platelets: 265 10*3/uL (ref 150–400)
RBC: 3.98 MIL/uL (ref 3.87–5.11)
RDW: 13.2 % (ref 11.5–15.5)
WBC: 5.7 10*3/uL (ref 4.0–10.5)
nRBC: 0 % (ref 0.0–0.2)

## 2021-06-24 LAB — BASIC METABOLIC PANEL
Anion gap: 7 (ref 5–15)
BUN: 8 mg/dL (ref 8–23)
CO2: 22 mmol/L (ref 22–32)
Calcium: 9.8 mg/dL (ref 8.9–10.3)
Chloride: 105 mmol/L (ref 98–111)
Creatinine, Ser: 0.92 mg/dL (ref 0.44–1.00)
GFR, Estimated: >60 mL/min (ref >60)
Glucose, Bld: 91 mg/dL (ref 70–99)
Potassium: 4.1 mmol/L (ref 3.5–5.1)
Sodium: 134 mmol/L — ABNORMAL LOW (ref 135–145)

## 2021-06-24 MED ORDER — ACETAMINOPHEN 325 MG PO TABS: 650.0000 mg | ORAL_TABLET | Freq: Four times a day (QID) | ORAL | Status: DC | PRN

## 2021-06-24 MED ORDER — SENNOSIDES-DOCUSATE SODIUM 8.6-50 MG PO TABS: 1.0000 | ORAL_TABLET | Freq: Every evening | ORAL | Status: DC | PRN

## 2021-06-24 MED ORDER — CLOPIDOGREL BISULFATE 75 MG PO TABS
75.0000 mg | ORAL_TABLET | Freq: Every day | ORAL | 0 refills | Status: AC
  Filled 2021-06-24: qty 21, 21d supply, fill #0

## 2021-06-24 MED ORDER — ASPIRIN 81 MG PO TBEC
81.0000 mg | DELAYED_RELEASE_TABLET | Freq: Every day | ORAL | 0 refills | Status: AC
  Filled 2021-06-24: qty 30, 30d supply, fill #0

## 2021-06-24 MED ORDER — ATORVASTATIN CALCIUM 40 MG PO TABS
40.0000 mg | ORAL_TABLET | Freq: Every day | ORAL | 0 refills | Status: DC
  Filled 2021-06-24: qty 60, 60d supply, fill #0

## 2021-06-24 NOTE — Discharge Summary (Signed)
Physician Discharge Summary  [name] WGN:[id number] DOB: 10-Mar-1946 DOA: 06/22/2021  PCP: Mechele Claude, MD  Admit date: 06/22/2021 Discharge date: 06/24/2021  Admitted From: Home Disposition:    Recommendations for Outpatient Follow-up:  Follow up with PCP in 1-2 weeks Please obtain BMP/CBC in one week your next doctors visit.  Follow-up outpatient neurology in 3-4 weeks Resume home blood pressure medications in about 3 days ASA + Plavix for 3 weeks followed by ASA Atorvastatin  po daily.   Home Health: Outpatient therapy Equipment/Devices: None Discharge Condition: Stable CODE STATUS: Full code Diet recommendation: Heart healthy  Brief/Interim Summary: 75 year old with history of HTN, hypothyroidism admitted to the hospital for transient right-sided weakness and expressive aphasia.  CT of the head was negative.  Patient initially was at [facility] but was transferred to [facility] for MRI and neurology evaluation.  MRI was positive for acute CVA.  Echocardiogram showed EF of 60% with grade 1 DD, LDL 105 and A1c 5.4.  Patient was seen by neurology.  PT recommended outpatient therapy therefore arrangements were made.   Acute CVA Expressive aphasia and right-sided weakness, very much resolved - CT of the head and CTA head and neck is negative for acute pathology.  MRI is consistent with acute CVA.  Echocardiogram shows EF 60% with grade 1 DD, LDL 105, A1c 5.4.  Patient will be seen by neurology today prior to discharge.  PT is recommending outpatient therapy therefore arrangements have been made. DAPT for 3 weeks followed by ASA. Lipitor  po daily.   Essential hypertension - Chronically she is on Norvasc and lisinopril which will be held for the next 3-5 days and slowly resume as outpatient  Hypothyroidism - Synthroid  Hyperlipidemia - LDL 105, will need to be on statin.   Body mass index is 24.41 kg/m.      Discharge Diagnoses:  Active  Problems:   Hypothyroidism   Hyperlipidemia   Essential hypertension   TIA (transient ischemic attack)      Consultations: Neurology  Subjective: Feels great no complaints.  Discharge Exam: Vitals:   06/24/21 0854 06/24/21 1123  BP:  (!) 156/70  Pulse: 85 80  Resp: 18 18  Temp:  98 F (36.7 C)  SpO2: 96% 98%   Vitals:   06/24/21 0431 06/24/21 0726 06/24/21 0854 06/24/21 1123  BP: 139/60 (!) 144/75  (!) 156/70  Pulse: 77 78 85 80  Resp: Temp: 97.6 F (36.4 C) 97.8 F (36.6 C)  98 F (36.7 C)  TempSrc: Oral Oral    SpO2: 94% 98% 96% 98%  Weight:        General: Pt is alert, awake, not in acute distress Cardiovascular: RRR, S1/S2 +, no rubs, no gallops Respiratory: CTA bilaterally, no wheezing, no rhonchi Abdominal: Soft, NT, ND, bowel sounds + Extremities: no edema, no cyanosis  Discharge Instructions  Discharge Instructions     Ambulatory referral to Neurology   Complete by: As directed    Follow up with stroke clinic NP (Jessica Vanschaick or Darrol Angel, if both not available, consider Manson Allan, or Ahern) at [facility] in about 4 weeks. Thanks.   Ambulatory referral to Physical Therapy   Complete by: As directed    Ambulatory referral to Physical Therapy   Complete by: As directed       Allergies as of 06/24/2021       Reactions   Evista [raloxifene Hcl] Other (See Comments)   Bone pain, couldn't  put weight on right leg/foot   Fosamax [alendronate Sodium]    Bone pain   Red Dye    Sulfa Antibiotics         Medication List     TAKE these medications    albuterol 108 (90 Base) MCG/ACT inhaler Commonly known as: VENTOLIN HFA Inhale 2 puffs into the lungs every 6 (six) hours as needed for wheezing.   alendronate 70 MG tablet Commonly known as: FOSAMAX Take 1 tablet (70 mg total) by mouth every 7 (seven) days. Take with a full glass of water on an empty stomach.   amLODipine 5 MG tablet Commonly known as: NORVASC Take  1 tablet (5 mg total) by mouth daily.   aspirin 81 MG EC tablet Take 1 tablet (81 mg total) by mouth daily. Swallow whole. Start taking on: June 25, 2021   atorvastatin 40 MG tablet Commonly known as: LIPITOR Take 1 tablet (40 mg total) by mouth daily. Start taking on: June 25, 2021   clopidogrel 75 MG tablet Commonly known as: PLAVIX Take 1 tablet (75 mg total) by mouth daily for 21 days. Start taking on: June 25, 2021   fluticasone 50 MCG/ACT nasal spray Commonly known as: FLONASE Use 2 spray(s) in each nostril once daily What changed:  how much to take how to take this when to take this   fluticasone-salmeterol 100-50 MCG/ACT Aepb Commonly known as: ADVAIR Inhale 1 puff into the lungs 2 (two) times daily.   hydrocortisone 25 MG suppository Commonly known as: ANUSOL-HC Place rectally 2 (two) times daily as needed.   levothyroxine 75 MCG tablet Commonly known as: Euthyrox Take 1 tablet (75 mcg total) by mouth daily.   lisinopril 20 MG tablet Commonly known as: ZESTRIL Take 1 tablet (20 mg total) by mouth daily.   multivitamin with minerals Tabs tablet Take 1 tablet by mouth daily.   pantoprazole 40 MG tablet Commonly known as: PROTONIX Take 1 tablet (40 mg total) by mouth daily. For stomach   Vitamin D3 75 MCG (3000 UT) Tabs Take 2,000 Units by mouth daily.        Follow-up Information     [facility] Follow up.   Specialty: Rehabilitation Why: If you have not heard from the rehab within 48 hours of discharge please call to schedule an appointment. Contact information: [address] [id number] mc [address] [place] [address] [phone]        [facility]. Schedule an appointment as soon as possible for a visit in 1 month(s).   Specialty: Neurology Why: stroke clinic Contact information: [address] [place] [address] [phone]         Mechele Claude, MD Follow up in 1 week(s).   Specialty: Family Medicine Contact information: [address] [place] [address] [phone]         Laqueta Linden, MD .   Specialty: Cardiology Contact information: [address] [place] [address] [phone]                Allergies  Allergen Reactions   Evista [Raloxifene Hcl] Other (See Comments)    Bone pain, couldn't put weight on right leg/foot   Fosamax [Alendronate Sodium]     Bone pain   Red Dye    Sulfa Antibiotics     You were cared for by a hospitalist during your hospital stay. If you have any questions about your discharge medications or the care you  received while you were in the hospital after you are discharged, you can call the unit and asked to speak with the hospitalist on call if the hospitalist that took care of you is not available. Once you are discharged, your primary care physician will handle any further medical issues. Please note that no refills for any discharge medications will be authorized once you are discharged, as it is imperative that you return to your primary care physician (or establish a relationship with a primary care physician if you do not have one) for your aftercare needs so that they can reassess your need for medications and monitor your lab values.   Procedures/Studies: CT ANGIO HEAD NECK W WO CM  Result Date: 06/22/2021 CLINICAL DATA:  Abnormal speech.  Right-sided weakness. EXAM: CT ANGIOGRAPHY HEAD AND NECK TECHNIQUE: Multidetector CT imaging of the head and neck was performed using the standard protocol during bolus administration of intravenous contrast. Multiplanar CT image reconstructions and MIPs were obtained to evaluate the vascular anatomy. Carotid stenosis measurements (when applicable) are obtained utilizing NASCET criteria, using the distal internal carotid diameter as the denominator. CONTRAST:  70mL OMNIPAQUE IOHEXOL 350 MG/ML SOLN COMPARISON:   CT head without contrast 06/22/2021 FINDINGS: CTA NECK FINDINGS Aortic arch: A 3 vessel arch configuration is present. No significant stenosis or aneurysm is present. Right carotid system: The right common carotid artery is within normal limits. Bifurcation is unremarkable. Mild tortuosity is present in the cervical right ICA without significant stenosis. Left carotid system: The left common carotid artery is within normal limits. Bifurcation is unremarkable. Mild tortuosity is present in the proximal cervical left ICA without significant stenosis. Vertebral arteries: The left vertebral artery is the dominant vessel. Both vertebral arteries originate from the subclavian arteries without significant stenosis. Skeleton: Vertebral body heights are maintained. 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 Slight anterolisthesis is present at C3-4 and C4-5. Endplate changes are noted at C5-6 and C6-7. This results in foraminal narrowing, greater on the left at C4-5, C5-6, and C6-7. Other neck: Soft tissues the neck are otherwise unremarkable. Salivary glands are within normal limits. Thyroid is normal. No significant adenopathy is present. No focal mucosal or submucosal lesions are present. Upper chest: The lung apices are clear. Thoracic inlet is within normal limits. Review of the MIP images confirms the above findings CTA HEAD FINDINGS Anterior circulation: Internal carotid arteries are within normal limits through the ICA termini bilaterally. The A1 and M1 segments are normal. The anterior communicating artery is patent. MCA bifurcations are intact. The ACA and MCA branch vessels are within normal limits. Posterior circulation: The left vertebral artery is the dominant vessel. The right vertebral artery is hypoplastic. Both vertebral arteries originate from the subclavian arteries without significant stenosis. No significant stenosis or occlusion is present in either vertebral artery in the neck. Venous sinuses: The dural  sinuses are patent. The straight sinus deep cerebral veins are intact. Cortical veins are within normal limits. No significant vascular malformation is evident. Anatomic variants: None Review of the MIP images confirms the above findings IMPRESSION: 1. No significant proximal stenosis, aneurysm, or branch vessel occlusion within the Circle of Willis. 2. Mild tortuosity of the cervical internal carotid arteries bilaterally without significant stenosis. 3. Multilevel spondylosis of the cervical spine. Electronically Signed   By: Marin Roberts M.D.   On: 06/22/2021 18:46   MR BRAIN WO CONTRAST  Result Date: 06/23/2021 CLINICAL DATA:  TIA.  Abnormal speech.  Right-sided weakness. EXAM: MRI HEAD WITHOUT CONTRAST TECHNIQUE:  Multiplanar, multiecho pulse sequences of the brain and surrounding structures were obtained without intravenous contrast. COMPARISON:  CT head without contrast 06/22/2021. CTA head and neck 06/22/2021. FINDINGS: Brain: Punctate nonhemorrhagic infarct is present in the posterior limb of the left internal capsule. No other acute infarcts are present. Mild periventricular white matter changes are present. A remote nonhemorrhagic lacunar infarct is present in the right thalamus. Mild generalized atrophy is within normal limits for age. The ventricles are of normal size. No significant extraaxial fluid collection is present. Vascular: Flow is present in the major intracranial arteries. Skull and upper cervical spine: The craniocervical junction is normal. Upper cervical spine is within normal limits. Marrow signal is unremarkable. Sinuses/Orbits: Left greater than right mastoid effusions are present. No obstructing nasopharyngeal lesion is present. The paranasal sinuses and mastoid air cells are otherwise clear. The globes and orbits are within normal limits. IMPRESSION: 1. Punctate nonhemorrhagic infarct in the posterior limb of the left internal capsule. 2. 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 3. Mild periventricular white matter disease likely reflects the sequela of chronic microvascular ischemia. 4. Left greater than right mastoid effusions. No obstructing nasopharyngeal lesion is present. These results will be called to the ordering clinician or representative by the Radiologist Assistant, and communication documented in the PACS or [employer]. Electronically Signed   By: Marin Roberts M.D.   On: 06/23/2021 17:13   ECHOCARDIOGRAM COMPLETE  Result Date: 06/23/2021    ECHOCARDIOGRAM REPORT   Patient Name:   [name] Date of Exam: 06/23/2021 Medical Rec #:  [id number]     Height:       61.0 in Accession #:    [id number]    Weight:       129.2 lb Date of Birth:  1946-03-06     BSA:          1.569 m Patient Age:    75 years      BP:           148/60 mmHg Patient Gender: F             HR:           81 bpm. [name] Procedure: 2D Echo, Cardiac Doppler and Color Doppler Indications:    TIA G45.9  History:        Patient has prior history of Echocardiogram examinations, most                 recent 07/13/2019. TIA; Risk Factors:Hypertension, Dyslipidemia                 and Non-Smoker.  Sonographer:    Celesta Gentile RCS Referring Phys: [id number] DAWOOD S [place] IMPRESSIONS  1. Left ventricular ejection fraction, by estimation, is 60 to 65%. The left ventricle has normal function. The left ventricle has no regional wall motion abnormalities. Left ventricular diastolic parameters are consistent with Grade I diastolic dysfunction (impaired relaxation).  2. Right ventricular systolic function is normal. The right ventricular size is normal. There is mildly elevated pulmonary artery systolic pressure.  3. The mitral valve is grossly normal. Trivial mitral valve regurgitation.  4. The aortic valve is tricuspid. There is mild thickening of the aortic valve. Aortic valve regurgitation is not visualized. No aortic stenosis is present.  5. The inferior  vena cava is dilated in size with >50% respiratory variability, suggesting right atrial pressure of 8 mmHg. FINDINGS  Left Ventricle: Left ventricular ejection fraction, by estimation,  is 60 to 65%. The left ventricle has normal function. The left ventricle has no regional wall motion abnormalities. The left ventricular internal cavity size was small. There is no left ventricular hypertrophy. Left ventricular diastolic parameters are consistent with Grade I diastolic dysfunction (impaired relaxation). Right Ventricle: The right ventricular size is normal. No increase in right ventricular wall thickness. Right ventricular systolic function is normal. There is mildly elevated pulmonary artery systolic pressure. The tricuspid regurgitant velocity is 2.81  m/s, and with an assumed right atrial pressure of 8 mmHg, the estimated right ventricular systolic pressure is 39.6 mmHg. Left Atrium: Left atrial size was normal in size. Right Atrium: Right atrial size was normal in size. Pericardium: There is no evidence of pericardial effusion. Mitral Valve: The mitral valve is grossly normal. Trivial mitral valve regurgitation. Tricuspid Valve: The tricuspid valve is normal in structure. Tricuspid valve regurgitation is mild . No evidence of tricuspid stenosis. Aortic Valve: The aortic valve is tricuspid. There is mild thickening of the aortic valve. Aortic valve regurgitation is not visualized. No aortic stenosis is present. Pulmonic Valve: The pulmonic valve was not well visualized. Pulmonic valve regurgitation is not visualized. No evidence of pulmonic stenosis. Aorta: The aortic root is normal in size and structure. Venous: The inferior vena cava is dilated in size with greater than 50% respiratory variability, suggesting right atrial pressure of 8 mmHg. IAS/Shunts: The atrial septum is grossly normal.  LEFT VENTRICLE PLAX 2D LVIDd:         3.10 cm   Diastology LVIDs:         1.50 cm   LV e' medial:    6.53 cm/s LV PW:          0.70 cm   LV E/e' medial:  16.7 LV IVS:        0.90 cm   LV e' lateral:   9.79 cm/s LVOT diam:     1.80 cm   LV E/e' lateral: 11.1 LV SV:         78 LV SV Index:   50 LVOT Area:     2.54 cm  RIGHT VENTRICLE RV S prime:     13.40 cm/s TAPSE (M-mode): 2.7 cm LEFT ATRIUM             Index        RIGHT ATRIUM           Index LA diam:        3.40 cm 2.17 cm/m   RA Area:     13.30 cm LA Vol (A2C):   41.8 ml 26.65 ml/m  RA Volume:   29.70 ml  18.93 ml/m LA Vol (A4C):   43.4 ml 27.67 ml/m LA Biplane Vol: 44.1 ml 28.11 ml/m  AORTIC VALVE LVOT Vmax:   155.50 cm/s LVOT Vmean:  97.900 cm/s LVOT VTI:    0.306 m  AORTA Ao Root diam: 2.40 cm MITRAL VALVE                TRICUSPID VALVE MV Area (PHT): 3.53 cm     TR Peak grad:   31.6 mmHg MV Decel Time: 215 msec     TR Vmax:        281.00 cm/s MV E velocity: 109.00 cm/s MV A velocity: 140.00 cm/s  SHUNTS MV E/A ratio:  0.78         Systemic VTI:  0.31 m  Systemic Diam: 1.80 cm Riley Lam MD Electronically signed by Riley Lam MD Signature Date/Time: 06/23/2021/10:51:17 AM    Final    CT HEAD CODE STROKE WO CONTRAST  Result Date: 06/22/2021 CLINICAL DATA:  Code stroke. Abnormal speech which has since resolved. Right-sided weakness. EXAM: CT HEAD WITHOUT CONTRAST TECHNIQUE: Contiguous axial images were obtained from the base of the skull through the vertex without intravenous contrast. COMPARISON:  None. FINDINGS: Brain: Asymmetric hypoattenuation is present in the posterior limb left internal capsule. This is nonspecific and could be chronic. Other scattered white matter changes are present bilaterally. Basal ganglia are otherwise intact. Insular ribbon is intact bilaterally. No acute cortical infarct is present. No hemorrhage or mass lesion is present. Hypoattenuation involving the pons, asymmetric on the left, is likely artifactual. Posterior fossa is otherwise unremarkable. Vascular: No hyperdense vessel or unexpected  calcification. Skull: Calvarium is intact. No focal lytic or blastic lesions are present. No significant extracranial soft tissue lesion is present. Sinuses/Orbits: The paranasal sinuses and mastoid air cells are clear. ASPECTS [facility] Stroke Program Early CT Score) - Ganglionic level infarction (caudate, lentiform nuclei, internal capsule, insula, M1-M3 cortex): 6/7 - Supraganglionic infarction (M4-M6 cortex): 3/3 Total score (0-10 with 10 being normal): 10/10 IMPRESSION: 1. Asymmetric hypoattenuation in the posterior limb left internal capsule is nonspecific. While this may be chronic white matter ischemia, acute infarction is not excluded. 2. Other scattered white matter changes are present bilaterally. This likely reflects the sequela of chronic microvascular ischemia. 3. No acute cortical infarct or hemorrhage. 4. ASPECTS is 9/10. These results were called by telephone at the time of interpretation on 06/22/2021 at 1:09 pm to provider Tanda Rockers , who verbally acknowledged these results. Electronically Signed   By: Marin Roberts M.D.   On: 06/22/2021 13:09     The results of significant diagnostics from this hospitalization (including imaging, microbiology, ancillary and laboratory) are listed below for reference.     Microbiology: Recent Results (from the past 240 hour(s))  Resp Panel by RT-PCR (Flu A&B, Covid) Nasopharyngeal Swab     Status: None   Collection Time: 06/22/21  1:41 PM   Specimen: Nasopharyngeal Swab; Nasopharyngeal(NP) swabs in vial transport medium  Result Value Ref Range Status   SARS Coronavirus 2 by RT PCR NEGATIVE NEGATIVE Final    Comment: (NOTE) SARS-CoV-2 target nucleic acids are NOT DETECTED.  The SARS-CoV-2 RNA is generally detectable in upper respiratory specimens during the acute phase of infection. The lowest concentration of SARS-CoV-2 viral copies this assay can detect is 138 copies/mL. A negative result does not preclude SARS-Cov-2 infection and  should not be used as the sole basis for treatment or other patient management decisions. A negative result may occur with  improper specimen collection/handling, submission of specimen other than nasopharyngeal swab, presence of viral mutation(s) within the areas targeted by this assay, and inadequate number of viral copies(<138 copies/mL). A negative result must be combined with clinical observations, patient history, and epidemiological information. The expected result is Negative.  Fact Sheet for Patients:  BloggerCourse.com  Fact Sheet for Healthcare Providers:  SeriousBroker.it  This test is no t yet approved or cleared by the [place] FDA and  has been authorized for detection and/or diagnosis of SARS-CoV-2 by FDA under an Emergency Use Authorization (EUA). This EUA will remain  in effect (meaning this test can be used) for the duration of the COVID-19 declaration under Section 564(b)(1) of the Act, 21 U.S.C.section 360bbb-3(b)(1), unless the authorization is terminated  or revoked sooner.       Influenza A by PCR NEGATIVE NEGATIVE Final   Influenza B by PCR NEGATIVE NEGATIVE Final    Comment: (NOTE) The Xpert Xpress SARS-CoV-2/FLU/RSV plus assay is intended as an aid in the diagnosis of influenza from Nasopharyngeal swab specimens and should not be used as a sole basis for treatment. Nasal washings and aspirates are unacceptable for Xpert Xpress SARS-CoV-2/FLU/RSV testing.  Fact Sheet for Patients: BloggerCourse.com  Fact Sheet for Healthcare Providers: SeriousBroker.it  This test is not yet approved or cleared by the [place] FDA and has been authorized for detection and/or diagnosis of SARS-CoV-2 by FDA under an Emergency Use Authorization (EUA). This EUA will remain in effect (meaning this test can be used) for the duration of the COVID-19 declaration  under Section 564(b)(1) of the Act, 21 U.S.C. section 360bbb-3(b)(1), unless the authorization is terminated or revoked.  Performed at [facility], [address] [place], [place] [address]      Labs: BNP (last 3 results) No results for input(s): BNP in the last 8760 hours. Basic Metabolic Panel: Recent Labs  Lab 06/22/21 1303 06/22/21 1309 06/23/21 0244 06/24/21 0235  NA 138 139 138 134*  K 3.7 3.8 3.4* 4.1  CL 106 106 106 105  CO2 26  --  26 22  GLUCOSE 110* 107* 105* 91  BUN 7* 6* 9 8  CREATININE 0.79 0.80 0.78 0.92  CALCIUM 10.1  --  9.9 9.8   Liver Function Tests: Recent Labs  Lab 06/22/21 1303 06/23/21 0244  AST 15 12*  ALT 15 12  ALKPHOS 63 52  BILITOT 0.3 0.2*  PROT 7.8 6.5  ALBUMIN 4.0 3.3*   No results for input(s): LIPASE, AMYLASE in the last 168 hours. No results for input(s): AMMONIA in the last 168 hours. CBC: Recent Labs  Lab 06/22/21 1303 06/22/21 1309 06/23/21 0244 06/24/21 0235  WBC 6.4  --  6.6 5.7  NEUTROABS 5.2  --   --   --   HGB 13.4 13.9 12.2 12.0  HCT 40.5 41.0 36.9 37.5  MCV 90.4  --  91.6 94.2  PLT 271  --  288 265   Cardiac Enzymes: No results for input(s): CKTOTAL, CKMB, CKMBINDEX, TROPONINI in the last 168 hours. BNP: Invalid input(s): POCBNP CBG: Recent Labs  Lab 06/22/21 1242  GLUCAP 102*   D-Dimer No results for input(s): DDIMER in the last 72 hours. Hgb A1c Recent Labs    06/23/21 0244  HGBA1C 5.1   Lipid Profile Recent Labs    06/23/21 0244  CHOL 183  HDL 63  LDLCALC 105*  TRIG 76  CHOLHDL 2.9   Thyroid function studies No results for input(s): TSH, T4TOTAL, T3FREE, THYROIDAB in the last 72 hours.  Invalid input(s): FREET3 Anemia work up No results for input(s): VITAMINB12, FOLATE, FERRITIN, TIBC, IRON, RETICCTPCT in the last 72 hours. Urinalysis    Component Value Date/Time   COLORURINE COLORLESS (A) 06/22/2021 1243   APPEARANCEUR CLEAR 06/22/2021 1243   APPEARANCEUR Clear 09/24/2020  0848   LABSPEC 1.003 (L) 06/22/2021 1243   PHURINE 7.0 06/22/2021 1243   GLUCOSEU NEGATIVE 06/22/2021 1243   HGBUR SMALL (A) 06/22/2021 1243   BILIRUBINUR NEGATIVE 06/22/2021 1243   BILIRUBINUR Negative 09/24/2020 0848   KETONESUR NEGATIVE 06/22/2021 1243   PROTEINUR NEGATIVE 06/22/2021 1243   NITRITE NEGATIVE 06/22/2021 1243   LEUKOCYTESUR NEGATIVE 06/22/2021 1243   Sepsis Labs Invalid input(s): PROCALCITONIN,  WBC,  LACTICIDVEN Microbiology Recent Results (from  the past 240 hour(s))  Resp Panel by RT-PCR (Flu A&B, Covid) Nasopharyngeal Swab     Status: None   Collection Time: 06/22/21  1:41 PM   Specimen: Nasopharyngeal Swab; Nasopharyngeal(NP) swabs in vial transport medium  Result Value Ref Range Status   SARS Coronavirus 2 by RT PCR NEGATIVE NEGATIVE Final    Comment: (NOTE) SARS-CoV-2 target nucleic acids are NOT DETECTED.  The SARS-CoV-2 RNA is generally detectable in upper respiratory specimens during the acute phase of infection. The lowest concentration of SARS-CoV-2 viral copies this assay can detect is 138 copies/mL. A negative result does not preclude SARS-Cov-2 infection and should not be used as the sole basis for treatment or other patient management decisions. A negative result may occur with  improper specimen collection/handling, submission of specimen other than nasopharyngeal swab, presence of viral mutation(s) within the areas targeted by this assay, and inadequate number of viral copies(<138 copies/mL). A negative result must be combined with clinical observations, patient history, and epidemiological information. The expected result is Negative.  Fact Sheet for Patients:  BloggerCourse.com  Fact Sheet for Healthcare Providers:  SeriousBroker.it  This test is no t yet approved or cleared by the [place] FDA and  has been authorized for detection and/or diagnosis of SARS-CoV-2 by FDA under an  Emergency Use Authorization (EUA). This EUA will remain  in effect (meaning this test can be used) for the duration of the COVID-19 declaration under Section 564(b)(1) of the Act, 21 U.S.C.section 360bbb-3(b)(1), unless the authorization is terminated  or revoked sooner.       Influenza A by PCR NEGATIVE NEGATIVE Final   Influenza B by PCR NEGATIVE NEGATIVE Final    Comment: (NOTE) The Xpert Xpress SARS-CoV-2/FLU/RSV plus assay is intended as an aid in the diagnosis of influenza from Nasopharyngeal swab specimens and should not be used as a sole basis for treatment. Nasal washings and aspirates are unacceptable for Xpert Xpress SARS-CoV-2/FLU/RSV testing.  Fact Sheet for Patients: BloggerCourse.com  Fact Sheet for Healthcare Providers: SeriousBroker.it  This test is not yet approved or cleared by the [place] FDA and has been authorized for detection and/or diagnosis of SARS-CoV-2 by FDA under an Emergency Use Authorization (EUA). This EUA will remain in effect (meaning this test can be used) for the duration of the COVID-19 declaration under Section 564(b)(1) of the Act, 21 U.S.C. section 360bbb-3(b)(1), unless the authorization is terminated or revoked.  Performed at [facility], [address] [place], [place] [address]      Time coordinating discharge:  I have spent 35 minutes face to face with the patient and on the ward discussing the patients care, assessment, plan and disposition with other care givers. >50% of the time was devoted counseling the patient about the risks and benefits of treatment/Discharge disposition and coordinating care.   SIGNED:   Dimple Nanas, MD  [facility] 06/24/2021, 11:29 AM   If 7PM-7AM, please contact night-coverage

## 2021-06-24 NOTE — Progress Notes (Signed)
PT Cancellation Note and Discharge  Patient Details Name: MIRTA MALLY MRN: [id number] DOB: 05/26/1946   Cancelled Treatment:    Reason Eval/Treat Not Completed: PT Eval received, chart reviewed. Noted pt was evaluated and discharged from acute PT services yesterday at [facility]. Pt appears to have no change in functional status since transfer to [facility] and continues to be independent to modified independent with Occupational Therapy. No acute PT needs identified and will again sign off. If needs change, please reconsult.    Marylynn Pearson 06/24/2021, 10:41 AM  Conni Slipper, PT, DPT Acute Rehabilitation Services Pager: [phone] Office: [phone]

## 2021-06-24 NOTE — TOC Transition Note (Signed)
Transition of Care [facility]) - CM/SW Discharge Note   Patient Details  Name: [name] MRN: [id number] Date of Birth: 1946-06-03  [facility] CM/SW Contact:  Kermit Balo, RN Phone Number: 06/24/2021, 11:48 AM   Clinical Narrative:    Patient is discharging home with outpatient therapy through [facility] in [place] per patient request. Information on the AVS. No DME needs.  Pt states her sister is going to stay with her at d/c to provide needed supervision.  Pt denies issues with home medications.  Pt has transportation home.    Final next level of care: OP Rehab Barriers to Discharge: No Barriers Identified   Patient Goals and CMS Choice     Choice offered to / list presented to : Patient  Discharge Placement                       Discharge Plan and Services                                     Social Determinants of Health (SDOH) Interventions     Readmission Risk Interventions No flowsheet data found.

## 2021-06-24 NOTE — Progress Notes (Signed)
STROKE TEAM PROGRESS NOTE   SUBJECTIVE (INTERVAL HISTORY) No family is at the bedside. Pt sitting in bed, no acute event overnight. She recounted HPI with me. She had sudden onset right hand weakness, not able to write and then expressive aphasia. The aphasia lasted about 20+ min and right hand weakness lasted about half day. Now she is back to baseline. PT recommend outpt PT.    OBJECTIVE Temp:  [97.6 F (36.4 C)-98.7 F (37.1 C)] 97.8 F (36.6 C) (10/31 0726) Pulse Rate:  [77-103] 85 (10/31 0854) Cardiac Rhythm: Normal sinus rhythm (10/31 0741) Resp:  [12-18] 18 (10/31 0854) BP: (139-176)/(59-93) 144/75 (10/31 0726) SpO2:  [94 %-99 %] 96 % (10/31 0854)  Recent Labs  Lab 06/22/21 1242  GLUCAP 102*   Recent Labs  Lab 06/22/21 1303 06/22/21 1309 06/23/21 0244 06/24/21 0235  NA 138 139 138 134*  K 3.7 3.8 3.4* 4.1  CL 106 106 106 105  CO2 26  --  26 22  GLUCOSE 110* 107* 105* 91  BUN 7* 6* 9 8  CREATININE 0.79 0.80 0.78 0.92  CALCIUM 10.1  --  9.9 9.8   Recent Labs  Lab 06/22/21 1303 06/23/21 0244  AST 15 12*  ALT 15 12  ALKPHOS 63 52  BILITOT 0.3 0.2*  PROT 7.8 6.5  ALBUMIN 4.0 3.3*   Recent Labs  Lab 06/22/21 1303 06/22/21 1309 06/23/21 0244 06/24/21 0235  WBC 6.4  --  6.6 5.7  NEUTROABS 5.2  --   --   --   HGB 13.4 13.9 12.2 12.0  HCT 40.5 41.0 36.9 37.5  MCV 90.4  --  91.6 94.2  PLT 271  --  288 265   No results for input(s): CKTOTAL, CKMB, CKMBINDEX, TROPONINI in the last 168 hours. Recent Labs    06/22/21 1303  LABPROT 12.7  INR 1.0   Recent Labs    06/22/21 1243  COLORURINE COLORLESS*  LABSPEC 1.003*  PHURINE 7.0  GLUCOSEU NEGATIVE  HGBUR SMALL*  BILIRUBINUR NEGATIVE  KETONESUR NEGATIVE  PROTEINUR NEGATIVE  NITRITE NEGATIVE  LEUKOCYTESUR NEGATIVE       Component Value Date/Time   CHOL 183 06/23/2021 0244   CHOL 214 (H) 03/27/2021 1037   CHOL 188 03/11/2013 1127   TRIG 76 06/23/2021 0244   TRIG 141 12/26/2014 1038   TRIG 171  (H) 03/11/2013 1127   HDL 63 06/23/2021 0244   HDL 71 03/27/2021 1037   HDL 60 12/26/2014 1038   HDL 47 03/11/2013 1127   CHOLHDL 2.9 06/23/2021 0244   VLDL 15 06/23/2021 0244   LDLCALC 105 (H) 06/23/2021 0244   LDLCALC 123 (H) 03/27/2021 1037   LDLCALC 128 (H) 04/24/2014 1420   LDLCALC 107 (H) 03/11/2013 1127   Lab Results  Component Value Date   HGBA1C 5.1 06/23/2021      Component Value Date/Time   LABOPIA NONE DETECTED 06/22/2021 1243   COCAINSCRNUR NONE DETECTED 06/22/2021 1243   LABBENZ NONE DETECTED 06/22/2021 1243   AMPHETMU NONE DETECTED 06/22/2021 1243   THCU NONE DETECTED 06/22/2021 1243   LABBARB NONE DETECTED 06/22/2021 1243    Recent Labs  Lab 06/22/21 1303  ETH <10    I have personally reviewed the radiological images below and agree with the radiology interpretations.  CT ANGIO HEAD NECK W WO CM  Result Date: 06/22/2021 CLINICAL DATA:  Abnormal speech.  Right-sided weakness. EXAM: CT ANGIOGRAPHY HEAD AND NECK TECHNIQUE: Multidetector CT imaging of the head and neck was performed  using the standard protocol during bolus administration of intravenous contrast. Multiplanar CT image reconstructions and MIPs were obtained to evaluate the vascular anatomy. Carotid stenosis measurements (when applicable) are obtained utilizing NASCET criteria, using the distal internal carotid diameter as the denominator. CONTRAST:  55mL OMNIPAQUE IOHEXOL 350 MG/ML SOLN COMPARISON:  CT head without contrast 06/22/2021 FINDINGS: CTA NECK FINDINGS Aortic arch: A 3 vessel arch configuration is present. No significant stenosis or aneurysm is present. Right carotid system: The right common carotid artery is within normal limits. Bifurcation is unremarkable. Mild tortuosity is present in the cervical right ICA without significant stenosis. Left carotid system: The left common carotid artery is within normal limits. Bifurcation is unremarkable. Mild tortuosity is present in the proximal cervical  left ICA without significant stenosis. Vertebral arteries: The left vertebral artery is the dominant vessel. Both vertebral arteries originate from the subclavian arteries without significant stenosis. Skeleton: Vertebral body heights are maintained. 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 Slight anterolisthesis is present at C3-4 and C4-5. Endplate changes are noted at C5-6 and C6-7. This results in foraminal narrowing, greater on the left at C4-5, C5-6, and C6-7. Other neck: Soft tissues the neck are otherwise unremarkable. Salivary glands are within normal limits. Thyroid is normal. No significant adenopathy is present. No focal mucosal or submucosal lesions are present. Upper chest: The lung apices are clear. Thoracic inlet is within normal limits. Review of the MIP images confirms the above findings CTA HEAD FINDINGS Anterior circulation: Internal carotid arteries are within normal limits through the ICA termini bilaterally. The A1 and M1 segments are normal. The anterior communicating artery is patent. MCA bifurcations are intact. The ACA and MCA branch vessels are within normal limits. Posterior circulation: The left vertebral artery is the dominant vessel. The right vertebral artery is hypoplastic. Both vertebral arteries originate from the subclavian arteries without significant stenosis. No significant stenosis or occlusion is present in either vertebral artery in the neck. Venous sinuses: The dural sinuses are patent. The straight sinus deep cerebral veins are intact. Cortical veins are within normal limits. No significant vascular malformation is evident. Anatomic variants: None Review of the MIP images confirms the above findings IMPRESSION: 1. No significant proximal stenosis, aneurysm, or branch vessel occlusion within the Circle of Willis. 2. Mild tortuosity of the cervical internal carotid arteries bilaterally without significant stenosis. 3. Multilevel spondylosis of the cervical spine. Electronically  Signed   By: Marin Roberts M.D.   On: 06/22/2021 18:46   MR BRAIN WO CONTRAST  Result Date: 06/23/2021 CLINICAL DATA:  TIA.  Abnormal speech.  Right-sided weakness. EXAM: MRI HEAD WITHOUT CONTRAST TECHNIQUE: Multiplanar, multiecho pulse sequences of the brain and surrounding structures were obtained without intravenous contrast. COMPARISON:  CT head without contrast 06/22/2021. CTA head and neck 06/22/2021. FINDINGS: Brain: Punctate nonhemorrhagic infarct is present in the posterior limb of the left internal capsule. No other acute infarcts are present. Mild periventricular white matter changes are present. A remote nonhemorrhagic lacunar infarct is present in the right thalamus. Mild generalized atrophy is within normal limits for age. The ventricles are of normal size. No significant extraaxial fluid collection is present. Vascular: Flow is present in the major intracranial arteries. Skull and upper cervical spine: The craniocervical junction is normal. Upper cervical spine is within normal limits. Marrow signal is unremarkable. Sinuses/Orbits: Left greater than right mastoid effusions are present. No obstructing nasopharyngeal lesion is present. The paranasal sinuses and mastoid air cells are otherwise clear. The globes and orbits are within  normal limits. IMPRESSION: 1. Punctate nonhemorrhagic infarct in the posterior limb of the left internal capsule. 2. 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 3. Mild periventricular white matter disease likely reflects the sequela of chronic microvascular ischemia. 4. Left greater than right mastoid effusions. No obstructing nasopharyngeal lesion is present. These results will be called to the ordering clinician or representative by the Radiologist Assistant, and communication documented in the PACS or [employer]. Electronically Signed   By: Marin Roberts M.D.   On: 06/23/2021 17:13   ECHOCARDIOGRAM COMPLETE  Result Date:  06/23/2021    ECHOCARDIOGRAM REPORT   Patient Name:   JADI DEYARMIN Date of Exam: 06/23/2021 Medical Rec #:  [id number]     Height:       61.0 in Accession #:    [id number]    Weight:       129.2 lb Date of Birth:  28-Jan-1946     BSA:          1.569 m Patient Age:    75 years      BP:           148/60 mmHg Patient Gender: F             HR:           81 bpm. [name] Procedure: 2D Echo, Cardiac Doppler and Color Doppler Indications:    TIA G45.9  History:        Patient has prior history of Echocardiogram examinations, most                 recent 07/13/2019. TIA; Risk Factors:Hypertension, Dyslipidemia                 and Non-Smoker.  Sonographer:    Celesta Gentile RCS Referring Phys: [phone] DAWOOD S ELGER[place]WY IMPRESSIONS  1. Left ventricular ejection fraction, by estimation, is 60 to 65%. The left ventricle has normal function. The left ventricle has no regional wall motion abnormalities. Left ventricular diastolic parameters are consistent with Grade I diastolic dysfunction (impaired relaxation).  2. Right ventricular systolic function is normal. The right ventricular size is normal. There is mildly elevated pulmonary artery systolic pressure.  3. The mitral valve is grossly normal. Trivial mitral valve regurgitation.  4. The aortic valve is tricuspid. There is mild thickening of the aortic valve. Aortic valve regurgitation is not visualized. No aortic stenosis is present.  5. The inferior vena cava is dilated in size with >50% respiratory variability, suggesting right atrial pressure of 8 mmHg. FINDINGS  Left Ventricle: Left ventricular ejection fraction, by estimation, is 60 to 65%. The left ventricle has normal function. The left ventricle has no regional wall motion abnormalities. The left ventricular internal cavity size was small. There is no left ventricular hypertrophy. Left ventricular diastolic parameters are consistent with Grade I diastolic dysfunction (impaired relaxation). Right  Ventricle: The right ventricular size is normal. No increase in right ventricular wall thickness. Right ventricular systolic function is normal. There is mildly elevated pulmonary artery systolic pressure. The tricuspid regurgitant velocity is 2.81  m/s, and with an assumed right atrial pressure of 8 mmHg, the estimated right ventricular systolic pressure is 39.6 mmHg. Left Atrium: Left atrial size was normal in size. Right Atrium: Right atrial size was normal in size. Pericardium: There is no evidence of pericardial effusion. Mitral Valve: The mitral valve is grossly normal. Trivial mitral valve regurgitation. Tricuspid Valve: The tricuspid valve is normal in structure. Tricuspid  valve regurgitation is mild . No evidence of tricuspid stenosis. Aortic Valve: The aortic valve is tricuspid. There is mild thickening of the aortic valve. Aortic valve regurgitation is not visualized. No aortic stenosis is present. Pulmonic Valve: The pulmonic valve was not well visualized. Pulmonic valve regurgitation is not visualized. No evidence of pulmonic stenosis. Aorta: The aortic root is normal in size and structure. Venous: The inferior vena cava is dilated in size with greater than 50% respiratory variability, suggesting right atrial pressure of 8 mmHg. IAS/Shunts: The atrial septum is grossly normal.  LEFT VENTRICLE PLAX 2D LVIDd:         3.10 cm   Diastology LVIDs:         1.50 cm   LV e' medial:    6.53 cm/s LV PW:         0.70 cm   LV E/e' medial:  16.7 LV IVS:        0.90 cm   LV e' lateral:   9.79 cm/s LVOT diam:     1.80 cm   LV E/e' lateral: 11.1 LV SV:         78 LV SV Index:   50 LVOT Area:     2.54 cm  RIGHT VENTRICLE RV S prime:     13.40 cm/s TAPSE (M-mode): 2.7 cm LEFT ATRIUM             Index        RIGHT ATRIUM           Index LA diam:        3.40 cm 2.17 cm/m   RA Area:     13.30 cm LA Vol (A2C):   41.8 ml 26.65 ml/m  RA Volume:   29.70 ml  18.93 ml/m LA Vol (A4C):   43.4 ml 27.67 ml/m LA Biplane Vol:  44.1 ml 28.11 ml/m  AORTIC VALVE LVOT Vmax:   155.50 cm/s LVOT Vmean:  97.900 cm/s LVOT VTI:    0.306 m  AORTA Ao Root diam: 2.40 cm MITRAL VALVE                TRICUSPID VALVE MV Area (PHT): 3.53 cm     TR Peak grad:   31.6 mmHg MV Decel Time: 215 msec     TR Vmax:        281.00 cm/s MV E velocity: 109.00 cm/s MV A velocity: 140.00 cm/s  SHUNTS MV E/A ratio:  0.78         Systemic VTI:  0.31 m                             Systemic Diam: 1.80 cm Riley Lam MD Electronically signed by Riley Lam MD Signature Date/Time: 06/23/2021/10:51:17 AM    Final    CT HEAD CODE STROKE WO CONTRAST  Result Date: 06/22/2021 CLINICAL DATA:  Code stroke. Abnormal speech which has since resolved. Right-sided weakness. EXAM: CT HEAD WITHOUT CONTRAST TECHNIQUE: Contiguous axial images were obtained from the base of the skull through the vertex without intravenous contrast. COMPARISON:  None. FINDINGS: Brain: Asymmetric hypoattenuation is present in the posterior limb left internal capsule. This is nonspecific and could be chronic. Other scattered white matter changes are present bilaterally. Basal ganglia are otherwise intact. Insular ribbon is intact bilaterally. No acute cortical infarct is present. No hemorrhage or mass lesion is present. Hypoattenuation involving the pons, asymmetric on the left, is likely artifactual. Posterior  fossa is otherwise unremarkable. Vascular: No hyperdense vessel or unexpected calcification. Skull: Calvarium is intact. No focal lytic or blastic lesions are present. No significant extracranial soft tissue lesion is present. Sinuses/Orbits: The paranasal sinuses and mastoid air cells are clear. ASPECTS [facility] Stroke Program Early CT Score) - Ganglionic level infarction (caudate, lentiform nuclei, internal capsule, insula, M1-M3 cortex): 6/7 - Supraganglionic infarction (M4-M6 cortex): 3/3 Total score (0-10 with 10 being normal): 10/10 IMPRESSION: 1. Asymmetric hypoattenuation in  the posterior limb left internal capsule is nonspecific. While this may be chronic white matter ischemia, acute infarction is not excluded. 2. Other scattered white matter changes are present bilaterally. This likely reflects the sequela of chronic microvascular ischemia. 3. No acute cortical infarct or hemorrhage. 4. ASPECTS is 9/10. These results were called by telephone at the time of interpretation on 06/22/2021 at 1:09 pm to provider Tanda Rockers , who verbally acknowledged these results. Electronically Signed   By: Marin Roberts M.D.   On: 06/22/2021 13:09     PHYSICAL EXAM  Temp:  [97.6 F (36.4 C)-98.7 F (37.1 C)] 97.8 F (36.6 C) (10/31 0726) Pulse Rate:  [77-103] 85 (10/31 0854) Resp:  [12-18] 18 (10/31 0854) BP: (139-176)/(59-93) 144/75 (10/31 0726) SpO2:  [94 %-99 %] 96 % (10/31 0854)  General - Well nourished, well developed, in no apparent distress.  Ophthalmologic - fundi not visualized due to noncooperation.  Cardiovascular - Regular rhythm and rate.  Mental Status -  Level of arousal and orientation to time, place, and person were intact. Language including expression, naming, repetition, comprehension was assessed and found intact. Fund of Knowledge was assessed and was intact.  Cranial Nerves II - XII - II - Visual field intact OU. III, IV, VI - Extraocular movements intact. V - Facial sensation intact bilaterally. VII - Facial movement intact bilaterally. VIII - Hearing & vestibular intact bilaterally. X - Palate elevates symmetrically. XI - Chin turning & shoulder shrug intact bilaterally. XII - Tongue protrusion intact.  Motor Strength - The patient's strength was normal in all extremities and pronator drift was absent.  Bulk was normal and fasciculations were absent.   Motor Tone - Muscle tone was assessed at the neck and appendages and was normal.  Reflexes - The patient's reflexes were symmetrical in all extremities and she had no pathological  reflexes.  Sensory - Light touch, temperature/pinprick were assessed and were symmetrical.    Coordination - The patient had normal movements in the hands and feet with no ataxia or dysmetria.  Tremor was absent.  Gait and Station - deferred.   ASSESSMENT/PLAN [name] is a 75 y.o. female with history of HTN, HLD admitted for transient right hand weakness and aphasia. No tPA given due to outside window and symptoms resolved.    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 MRI  small punctate infarct at left PLIC 2D Echo  EF 60-65% LDL 105 HgbA1c 5.1 lovenox for VTE prophylaxis No antithrombotic prior to admission, now on aspirin 81 mg daily and clopidogrel 75 mg daily DAPT for 3 weeks and then ASA alone. Patient counseled to be compliant with her antithrombotic medications Ongoing aggressive stroke risk factor management Therapy recommendations:  outpt PT Disposition:  home today  Hypertension Stable Long term BP goal normotensive  Hyperlipidemia Home meds:  none  LDL 105, goal < 70 Now on lipitor 40 Continue statin at discharge  Other Stroke Risk Factors Advanced age  Other Active Problems Hypokalemia - supplement  Hospital day # 0  Neurology will sign off. Please call with questions. Pt will follow up with stroke clinic NP at [facility] in about 4 weeks. Thanks for the consult.   Marvel Plan, MD PhD Stroke Neurology 06/24/2021 11:21 AM    To contact Stroke Continuity provider, please refer to [email or web address]. After hours, contact General Neurology

## 2021-06-24 NOTE — Care Management Obs Status (Signed)
MEDICARE OBSERVATION STATUS NOTIFICATION   Patient Details  Name: [name] MRN: [id number] Date of Birth: Jan 01, 1946   Medicare Observation Status Notification Given:  Yes    Kermit Balo, RN 06/24/2021, 10:41 AM

## 2021-06-24 NOTE — Evaluation (Signed)
Occupational Therapy Evaluation Patient Details Name: [name] MRN: [id number] DOB: 03-17-1946 Today's Date: 06/24/2021   History of Present Illness 75 year old female with a history of hypertension, hypothyroidism, presented with transient right-sided weakness and expressive aphasia.  She reports she was unable to form her words and also had difficulty writing with her right hand.  She reportedly had right upper and lower extremity weakness.  By the time she came to the emergency room, overall symptoms had improved.  CT head noted to be unremarkable.  She is being admitted to [facility] for further evaluation with MRI brain and may also need neurology input.   Clinical Impression   [name] was indep PTA, she lives alone in a 1 level home with 1 STE. Upon evaluation pt completed ADLs and functional mobility at her mod I baseline, no apparent LOB with good safety awareness. She is slightly limited by generalized weakness and poor activity tolerance, encouraged OOB for meals and all ADLs acutely with nursing staff. Pt does not have acute OT needs, will sign off at this time. Recommend pt d/c home with supervision initially for mobility and ADLs - pt stated her sister can assist as needed & plans to move in with her soon.    Recommendations for follow up therapy are one component of a multi-disciplinary discharge planning process, led by the attending physician.  Recommendations may be updated based on patient status, additional functional criteria and insurance authorization.   Follow Up Recommendations  No OT follow up    Assistance Recommended at Discharge Intermittent Supervision/Assistance  Functional Status Assessment  Patient has had a recent decline in their functional status and demonstrates the ability to make significant improvements in function in a reasonable and predictable amount of time.  Equipment Recommendations  None recommended by OT       Precautions /  Restrictions Precautions Precautions: None Restrictions Weight Bearing Restrictions: No      Mobility Bed Mobility Overal bed mobility: Independent                  Transfers Overall transfer level: Independent                        Balance Overall balance assessment: Modified Independent (prefers external support when completing functional tasks in standing)                                         ADL either performed or assessed with clinical judgement   ADL Overall ADL's : At baseline                                       General ADL Comments: Pt completed all ADLs at mod I level for increased time, no AD needed. Pt demonstrated generalized weakness and some fatigue, however overall WFL for ADLs and mobility.     Vision Baseline Vision/History: 1 Wears glasses Ability to See in Adequate Light: 0 Adequate Patient Visual Report: No change from baseline Vision Assessment?: No apparent visual deficits     Perception     Praxis      Pertinent Vitals/Pain Pain Assessment: No/denies pain     Hand Dominance Right   Extremity/Trunk Assessment Upper Extremity Assessment Upper Extremity Assessment: Generalized weakness   Lower Extremity  Assessment Lower Extremity Assessment: Overall WFL for tasks assessed   Cervical / Trunk Assessment Cervical / Trunk Assessment: Normal   Communication Communication Communication: No difficulties   Cognition Arousal/Alertness: Awake/alert Behavior During Therapy: WFL for tasks assessed/performed Overall Cognitive Status: Within Functional Limits for tasks assessed                                       General Comments  VSS on RA    Exercises     Shoulder Instructions      Home Living Family/patient expects to be discharged to:: Private residence Living Arrangements: Alone Available Help at Discharge: Family Type of Home: House Home Access: Stairs to  enter Secretary/administrator of Steps: 1   Home Layout: One level     Bathroom Shower/Tub: Producer, television/film/video: Standard     Home Equipment: Agricultural consultant (2 wheels);Cane - single point;Grab bars - tub/shower;Grab bars - toilet   Additional Comments: pt's sister plans to move in wtih her soon      Prior Functioning/Environment Prior Level of Function : Independent/Modified Independent;Driving               ADLs Comments: helps take care of brother        OT Problem List: Decreased activity tolerance;Impaired balance (sitting and/or standing);Pain      OT Treatment/Interventions:      OT Goals(Current goals can be found in the care plan section) Acute Rehab OT Goals Patient Stated Goal: home today OT Goal Formulation: All assessment and education complete, DC therapy Time For Goal Achievement: 06/24/21  OT Frequency:     Barriers to D/C:            Co-evaluation              AM-PAC OT "6 Clicks" Daily Activity     Outcome Measure Help from another person eating meals?: None Help from another person taking care of personal grooming?: None Help from another person toileting, which includes using toliet, bedpan, or urinal?: None Help from another person bathing (including washing, rinsing, drying)?: None Help from another person to put on and taking off regular upper body clothing?: None Help from another person to put on and taking off regular lower body clothing?: None 6 Click Score: 24   End of Session Nurse Communication: Mobility status  Activity Tolerance: Patient tolerated treatment well Patient left: in chair;with call bell/phone within reach  OT Visit Diagnosis: Unsteadiness on feet (R26.81);Other abnormalities of gait and mobility (R26.89);Muscle weakness (generalized) (M62.81);History of falling (Z91.81);Pain                Time: 1751-0258 OT Time Calculation (min): 14 min Charges:  OT General Charges $OT Visit: 1 Visit OT  Evaluation $OT Eval Low Complexity: 1 Low   Pheobe Sandiford A Corrine Tillis 06/24/2021, 8:19 AM

## 2021-06-24 NOTE — Evaluation (Signed)
Speech Language Pathology Evaluation Patient Details Name: HADIA MINIER MRN: [id number] DOB: 02-09-1946 Today's Date: 06/24/2021 Time: 8502-7741 SLP Time Calculation (min) (ACUTE ONLY): 25 min  Problem List:  Patient Active Problem List   Diagnosis Date Noted   TIA (transient ischemic attack) 06/22/2021   Asthma, chronic, moderate persistent, uncomplicated 01/24/2020   Essential hypertension 05/18/2019   Abnormal EKG 05/18/2019   Osteopenia 03/11/2013   Hyperlipidemia 03/11/2013   Hypothyroidism 11/22/2012   Asthma, chronic 11/22/2012   Past Medical History:  Past Medical History:  Diagnosis Date   Allergy    seasonal allergies   Asthma    uses inhaler    Colon polyps    GERD (gastroesophageal reflux disease)    with certain foods   Hyperlipidemia    on meds   Hypertension    on meds   Menopausal state    Osteopenia    Rhinitis    Thyroid disease    on meds   Vitamin D deficiency disease    Past Surgical History:  Past Surgical History:  Procedure Laterality Date   CHOLECYSTECTOMY  2005   COLONOSCOPY  2099   CG-F/V-miralax(exc)-TA   TONSILLECTOMY     HPI:  75 y.o. female, with past medical history of hypertension, hyperlipidemia, hypothyroidism, she presented to emergency room on 06/22/21 secondary to complaints of right-sided weakness, and expressive aphasia , symptoms have resolved by now, reports she was reading a book, when she went to go write down the name of the Thereasa Parkin, she was unable to write due to weakness in her right hand, as well she did stumble when she stood up, she noted right upper/lower extremity weakness, and patient called her sister when she had difficulty getting her words out, patient was brought to EMS, patient had CT head done, and when she is back from CT, apparently all the symptoms has resolved, patient was seen by telestroke team, notion of symptoms she was not a TNKase candidate; MRI head on 06/23/21 indicated 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  2. 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  Passed [facility] swallow screen on 06/22/21.  SLE generated to r/o speech/language/cognitive deficits.   Assessment / Plan / Recommendation Clinical Impression  Pt assessed via the SLUMS ([employer] Mental Status Examination) which yielded a score of 28/30 (with 27/30 being typical for this assessment).  A slight lisp (not affecting intelligibility) within complex conversation noted with sibilants /s,z/ intermittently during conversation, but speech judged to be 95% intelligible overall.   Pt oriented x4, no aphasic (receptive/expressive) components noted throughout evaluation as pt was able to follow directives, verbally express herself and aware of current deficits.  Attention/memory within functional limits for simple calculation, recalling words after a time delay and word fluency skills accurate.  Graphic expression and reading tasks all within normal limits.  No ST indicated at this time within acute setting and/or after d/c as pt appears to be functioning at baseline level (independent) for cognitive/linguistic skills.  She will be receiving PT within an outpatient clinic once discharged and her sister will be staying with her at home short-term until she regains her full independence with mobility/strength.  Thank you for this referral.  ST will s/o at this time.    SLP Assessment  SLP Recommendation/Assessment: Patient does not need any further Speech Lanaguage Pathology Services SLP Visit Diagnosis: Cognitive communication deficit (R41.841)    Recommendations for follow up therapy are one  component of a multi-disciplinary discharge planning process, led by the attending physician.  Recommendations may be updated based on patient status, additional functional criteria and insurance authorization.    Follow Up Recommendations  None    Frequency and Duration Other  (Comment) (evaluation only)         SLP Evaluation Cognition  Overall Cognitive Status: Within Functional Limits for tasks assessed Arousal/Alertness: Awake/alert Orientation Level: Oriented X4 Year: 2022 Month: October Day of Week: Correct Attention: Sustained Sustained Attention: Appears intact Memory: Appears intact Immediate Memory Recall: Sock;Blue;Bed Memory Recall Sock: Without Cue Memory Recall Blue: Without Cue Memory Recall Bed: Without Cue Awareness: Appears intact Problem Solving: Appears intact Safety/Judgment: Appears intact       Comprehension  Auditory Comprehension Overall Auditory Comprehension: Appears within functional limits for tasks assessed Yes/No Questions: Within Functional Limits Commands: Within Functional Limits Conversation: Complex Visual Recognition/Discrimination Discrimination: Within Function Limits Reading Comprehension Reading Status: Within funtional limits    Expression Expression Primary Mode of Expression: Verbal Verbal Expression Overall Verbal Expression: Appears within functional limits for tasks assessed Initiation: No impairment Level of Generative/Spontaneous Verbalization: Conversation Repetition: No impairment Naming: No impairment Pragmatics: No impairment Non-Verbal Means of Communication: Not applicable Written Expression Dominant Hand: Right Written Expression: Within Functional Limits   Oral / Motor  Oral Motor/Sensory Function Overall Oral Motor/Sensory Function: Within functional limits Motor Speech Overall Motor Speech: Appears within functional limits for tasks assessed Respiration: Within functional limits Phonation: Normal Resonance: Within functional limits Articulation: Within functional limitis Intelligibility: Intelligible Motor Planning: Witnin functional limits Motor Speech Errors: Not applicable                       Tressie Stalker, M.S., CCC-SLP 06/24/2021, 12:34 PM

## 2021-06-25 ENCOUNTER — Telehealth: Payer: Self-pay

## 2021-06-25 NOTE — Telephone Encounter (Signed)
Transition Care Management Follow-up Telephone Call Date of discharge and from where: [name] 06/24/21 Diagnosis: acute CVA How have you been since you were released from the hospital? Doing well Any questions or concerns? No  Items Reviewed: Did the pt receive and understand the discharge instructions provided? Yes  Medications obtained and verified? Yes  Other? No  Any new allergies since your discharge? No  Dietary orders reviewed? Yes Do you have support at home? Yes   Home Care and Equipment/Supplies: Were home health services ordered? no  Were any new equipment or medical supplies ordered?  No  Functional Questionnaire: (I = Independent and D = Dependent) ADLs: I  Bathing/Dressing- I  Meal Prep- D  Eating- I  Maintaining continence- I  Transferring/Ambulation- I  Managing Meds- I  Follow up appointments reviewed:  PCP Hospital f/u appt confirmed? Yes  Scheduled to see [facility] on 06/27/21 @ 9:10. Specialist Hospital f/u appt confirmed?  Has appt with PT 11/9/ @ 10:30 - will call neurology and schedule appt today  Are transportation arrangements needed? No  If their condition worsens, is the pt aware to call PCP or go to the Emergency Dept.? Yes Was the patient provided with contact information for the PCP's office or ED? Yes Was to pt encouraged to call back with questions or concerns? Yes

## 2021-06-27 ENCOUNTER — Encounter: Payer: Self-pay | Admitting: Family Medicine

## 2021-06-27 ENCOUNTER — Other Ambulatory Visit: Payer: Self-pay

## 2021-06-27 ENCOUNTER — Ambulatory Visit (INDEPENDENT_AMBULATORY_CARE_PROVIDER_SITE_OTHER): Payer: Medicare Other | Admitting: Family Medicine

## 2021-06-27 VITALS — BP 138/66 | HR 89 | Temp 98.3°F | Ht 61.0 in | Wt 132.4 lb

## 2021-06-27 DIAGNOSIS — I6930 Unspecified sequelae of cerebral infarction: Secondary | ICD-10-CM | POA: Diagnosis not present

## 2021-06-27 DIAGNOSIS — I639 Cerebral infarction, unspecified: Secondary | ICD-10-CM

## 2021-06-27 DIAGNOSIS — E782 Mixed hyperlipidemia: Secondary | ICD-10-CM

## 2021-06-27 DIAGNOSIS — I10 Essential (primary) hypertension: Secondary | ICD-10-CM

## 2021-06-27 LAB — CMP14+EGFR
ALT: 25 IU/L (ref 0–32)
AST: 26 IU/L (ref 0–40)
Albumin/Globulin Ratio: 1.6 (ref 1.2–2.2)
Albumin: 4.1 g/dL (ref 3.7–4.7)
Alkaline Phosphatase: 64 IU/L (ref 44–121)
BUN/Creatinine Ratio: 8 — ABNORMAL LOW (ref 12–28)
BUN: 7 mg/dL — ABNORMAL LOW (ref 8–27)
Bilirubin Total: 0.5 mg/dL (ref 0.0–1.2)
CO2: 25 mmol/L (ref 20–29)
Calcium: 10 mg/dL (ref 8.7–10.3)
Chloride: 101 mmol/L (ref 96–106)
Creatinine, Ser: 0.9 mg/dL (ref 0.57–1.00)
Globulin, Total: 2.5 g/dL (ref 1.5–4.5)
Glucose: 99 mg/dL (ref 70–99)
Potassium: 4.2 mmol/L (ref 3.5–5.2)
Sodium: 136 mmol/L (ref 134–144)
Total Protein: 6.6 g/dL (ref 6.0–8.5)
eGFR: 66 mL/min/{1.73_m2} (ref >59)

## 2021-06-27 LAB — CBC WITH DIFFERENTIAL/PLATELET
Basophils Absolute: 0 10*3/uL (ref 0.0–0.2)
Basos: 1 %
EOS (ABSOLUTE): 0.1 10*3/uL (ref 0.0–0.4)
Eos: 2 %
Hematocrit: 41.1 % (ref 34.0–46.6)
Hemoglobin: 13.3 g/dL (ref 11.1–15.9)
Immature Grans (Abs): 0 10*3/uL (ref 0.0–0.1)
Immature Granulocytes: 0 %
Lymphocytes Absolute: 0.8 10*3/uL (ref 0.7–3.1)
Lymphs: 14 %
MCH: 29 pg (ref 26.6–33.0)
MCHC: 32.4 g/dL (ref 31.5–35.7)
MCV: 90 fL (ref 79–97)
Monocytes Absolute: 0.4 10*3/uL (ref 0.1–0.9)
Monocytes: 8 %
Neutrophils Absolute: 4.1 10*3/uL (ref 1.4–7.0)
Neutrophils: 75 %
Platelets: 332 10*3/uL (ref 150–450)
RBC: 4.58 x10E6/uL (ref 3.77–5.28)
RDW: 12.6 % (ref 11.7–15.4)
WBC: 5.4 10*3/uL (ref 3.4–10.8)

## 2021-06-27 MED ORDER — ATORVASTATIN CALCIUM 40 MG PO TABS: 40.0000 mg | ORAL_TABLET | Freq: Every day | ORAL | 3 refills | Status: DC

## 2021-06-27 NOTE — Progress Notes (Signed)
Subjective:  Patient ID: [name], female    DOB: 06-10-1946  Age: 75 y.o. MRN: [id number]  CC: Transitions Of Care   HPI [name] presents for hospital TOC due to stroke. Hospitalized from 10/29 to 10/31. Initially had disarthria, but that resolved quickly. Starting PT locally on November 9. Right leg and hip hurt with weightbearing. Feeling weak all over.  Depression screen [facility] 2/9 06/27/2021 03/27/2021 09/24/2020  Decreased Interest 0 0 0  Down, Depressed, Hopeless 0 0 0  PHQ - 2 Score 0 0 0    History [name] has a past medical history of Allergy, Asthma, Colon polyps, GERD (gastroesophageal reflux disease), Hyperlipidemia, Hypertension, Menopausal state, Osteopenia, Rhinitis, Thyroid disease, and Vitamin D deficiency disease.   She has a past surgical history that includes Tonsillectomy; Cholecystectomy (2005); and Colonoscopy (2099).   Her family history includes AAA (abdominal aortic aneurysm) in her mother; Asthma in her brother; Colon cancer (age of onset: 29) in her mother; Colon polyps in her mother; Gout in her sister; Heart attack in her father; Heart disease in her father; Hyperlipidemia in her mother; Hypertension in her brother, mother, and sister; Osteoporosis in her mother; Thyroid disease in her mother.She reports that she has never smoked. She has never used smokeless tobacco. She reports that she does not drink alcohol and does not use drugs.    ROS Review of Systems  Constitutional: Negative.   HENT: Negative.    Eyes:  Negative for visual disturbance.  Respiratory:  Negative for shortness of breath.   Cardiovascular:  Negative for chest pain.  Gastrointestinal:  Negative for abdominal pain.  Musculoskeletal:  Negative for arthralgias.  Neurological:  Positive for weakness (RLE).   Objective:  BP 138/66   Pulse 89   Temp 98.3 F (36.8 C)   Ht _0  (1.549 m)   Wt 132 lb 6.4 oz (60.1 kg)   SpO2 100%   BMI 25.02 kg/m   BP Readings from Last 3  Encounters:  06/27/21 138/66  06/24/21 (!) 156/70  03/27/21 140/68    Wt Readings from Last 3 Encounters:  06/27/21 132 lb 6.4 oz (60.1 kg)  06/22/21 129 lb 3 oz (58.6 kg)  03/27/21 126 lb 9.6 oz (57.4 kg)     Physical Exam Constitutional:      General: She is not in acute distress.    Appearance: She is well-developed.  Cardiovascular:     Rate and Rhythm: Normal rate and regular rhythm.  Pulmonary:     Breath sounds: Normal breath sounds.  Musculoskeletal:        General: Normal range of motion.  Skin:    General: Skin is warm and dry.  Neurological:     Mental Status: She is alert and oriented to person, place, and time.      Assessment & Plan:   [name] was seen today for transitions of care.  Diagnoses and all orders for this visit:  Cerebrovascular accident (CVA), unspecified mechanism ([place]) -     CBC with Differential/Platelet -     CMP14+EGFR  Mixed hyperlipidemia  Essential hypertension  Other orders -     atorvastatin (LIPITOR) 40 MG tablet; Take 1 tablet (40 mg total) by mouth daily.      I am having Jennifer P. [place]losa "[name]" maintain her multivitamin with minerals, Vitamin D3, albuterol, hydrocortisone, fluticasone, pantoprazole, amLODipine, levothyroxine, lisinopril, alendronate, fluticasone-salmeterol, clopidogrel, aspirin, and atorvastatin.  Allergies as of 06/27/2021       Reactions  Evista [raloxifene Hcl] Other (See Comments)   Bone pain, couldn't put weight on right leg/foot   Fosamax [alendronate Sodium]    Bone pain   Red Dye    Sulfa Antibiotics         Medication List        Accurate as of June 27, 2021  9:45 AM. If you have any questions, ask your nurse or doctor.          albuterol 108 (90 Base) MCG/ACT inhaler Commonly known as: VENTOLIN HFA Inhale 2 puffs into the lungs every 6 (six) hours as needed for wheezing.   alendronate 70 MG tablet Commonly known as: FOSAMAX Take 1 tablet (70 mg total) by mouth  every 7 (seven) days. Take with a full glass of water on an empty stomach.   amLODipine 5 MG tablet Commonly known as: NORVASC Take 1 tablet (5 mg total) by mouth daily.   Aspirin Low Dose 81 MG EC tablet Generic drug: aspirin Take 1 tablet (81 mg total) by mouth daily. Swallow whole.   atorvastatin 40 MG tablet Commonly known as: LIPITOR Take 1 tablet (40 mg total) by mouth daily.   clopidogrel 75 MG tablet Commonly known as: PLAVIX Take 1 tablet (75 mg total) by mouth daily for 21 days.   fluticasone 50 MCG/ACT nasal spray Commonly known as: FLONASE Use 2 spray(s) in each nostril once daily What changed:  how much to take how to take this when to take this   fluticasone-salmeterol 100-50 MCG/ACT Aepb Commonly known as: ADVAIR Inhale 1 puff into the lungs 2 (two) times daily.   hydrocortisone 25 MG suppository Commonly known as: ANUSOL-HC Place rectally 2 (two) times daily as needed.   levothyroxine 75 MCG tablet Commonly known as: Euthyrox Take 1 tablet (75 mcg total) by mouth daily.   lisinopril 20 MG tablet Commonly known as: ZESTRIL Take 1 tablet (20 mg total) by mouth daily.   multivitamin with minerals Tabs tablet Take 1 tablet by mouth daily.   pantoprazole 40 MG tablet Commonly known as: PROTONIX Take 1 tablet (40 mg total) by mouth daily. For stomach   Vitamin D3 75 MCG (3000 UT) Tabs Take 2,000 Units by mouth daily.         Follow-up: Return in about 3 months (around 09/27/2021), or if symptoms worsen or fail to improve.  [name], M.D.

## 2021-07-01 NOTE — Progress Notes (Signed)
Hello [name],  Your lab result is normal and/or stable.Some minor variations that are not significant are commonly marked abnormal, but do not represent any medical problem for you.  Best regards, Odas Ozer, M.D.

## 2021-07-03 ENCOUNTER — Ambulatory Visit: Payer: Medicare Other | Attending: Internal Medicine

## 2021-07-03 ENCOUNTER — Other Ambulatory Visit: Payer: Self-pay

## 2021-07-03 VITALS — BP 149/74

## 2021-07-03 DIAGNOSIS — G459 Transient cerebral ischemic attack, unspecified: Secondary | ICD-10-CM | POA: Diagnosis not present

## 2021-07-03 DIAGNOSIS — M6281 Muscle weakness (generalized): Secondary | ICD-10-CM | POA: Insufficient documentation

## 2021-07-03 NOTE — Therapy (Signed)
[facility] [address] [place], [place], [address] Phone: [phone]   Fax:  [phone]  Physical Therapy Evaluation  Patient Details  Name: [name] MRN: [id number] Date of Birth: 03/20/1946 Referring Provider (PT): Nelson Chimes   Encounter Date: 07/03/2021   PT End of Session - 07/03/21 1033     Visit Number 1    Number of Visits 12    Date for PT Re-Evaluation 08/30/21    PT Start Time 1034    PT Stop Time 1114    PT Time Calculation (min) 40 min    Activity Tolerance Patient tolerated treatment well    Behavior During Therapy [facility] for tasks assessed/performed             Past Medical History:  Diagnosis Date   Allergy    seasonal allergies   Asthma    uses inhaler    Colon polyps    GERD (gastroesophageal reflux disease)    with certain foods   Hyperlipidemia    on meds   Hypertension    on meds   Menopausal state    Osteopenia    Rhinitis    Thyroid disease    on meds   Vitamin D deficiency disease     Past Surgical History:  Procedure Laterality Date   CHOLECYSTECTOMY  2005   COLONOSCOPY  2099   CG-F/V-miralax(exc)-TA   TONSILLECTOMY      Vitals:   07/03/21 1048  BP: (!) 149/74      Subjective Assessment - 07/03/21 1033     Subjective Patient reports that she had a stroke on 06/22/21. She was transported to [facility] Emergency department and was released on 10/31. She notes that she is really weak and get tired every easy.    Pertinent History osteoporosis    Limitations Walking    How long can you walk comfortably? about 20 feet    Patient Stated Goals be independent, get stronger, drive her car (MD told her to wait 1 month prior to driving)    Currently in Pain? Yes    Pain Score 8     Pain Location Arm    Pain Orientation Right;Distal    Pain Descriptors / Indicators Shooting    Pain Type Acute pain    Pain Radiating Towards from right forearm into the thumb    Pain Onset 1 to 4 weeks  ago    Pain Frequency Intermittent                [facility] PT Assessment - 07/03/21 0001       Assessment   Medical Diagnosis History of TIA    Referring Provider (PT) Amin    Onset Date/Surgical Date 06/22/21    Hand Dominance Right    Next MD Visit 09/26/21    Prior Therapy No      Precautions   Precautions None      Restrictions   Weight Bearing Restrictions No    Other Position/Activity Restrictions Avoid driving for 1 month      Balance Screen   Has the patient fallen in the past 6 months No    Has the patient had a decrease in activity level because of a fear of falling?  No    Is the patient reluctant to leave their home because of a fear of falling?  No      Home Nurse, mental health Private residence    Living Arrangements Other relatives  Type of Home House    Home Access Stairs to enter    Entrance Stairs-Number of Steps 1    Home Layout One level    Home Equipment Walker - 2 wheels   only uses to get her balance when she first gets up out of bed     Prior Function   Level of Independence Independent    Leisure Reading and crossword puzzles      Cognition   Overall Cognitive Status Within Functional Limits for tasks assessed    Attention Focused    Focused Attention Appears intact    Memory Appears intact    Awareness Appears intact    Problem Solving Appears intact      Sensation   Additional Comments Patient reports no numbness or tingling      ROM / Strength   AROM / PROM / Strength Strength;AROM      AROM   Overall AROM  Within functional limits for tasks performed      Strength   Overall Strength Comments Grip strength (lbs): R: 30, 35, 30; L: 30, 35, 35    Strength Assessment Site Hip;Knee;Ankle;Shoulder;Elbow    Right/Left Shoulder Right;Left    Right Shoulder Flexion 3+/5    Right Shoulder ABduction 4/5    Left Shoulder Flexion 4-/5    Left Shoulder ABduction 4/5    Right/Left Elbow Right;Left    Right Elbow Flexion  4-/5    Right Elbow Extension 4-/5    Left Elbow Flexion 4/5    Left Elbow Extension 4/5    Right/Left Hip Right;Left    Right Hip Flexion 3+/5    Left Hip Flexion 4-/5    Right/Left Knee Right;Left    Right Knee Flexion 4/5    Right Knee Extension 4+/5    Left Knee Flexion 4+/5    Left Knee Extension 5/5    Right/Left Ankle Right;Left    Right Ankle Dorsiflexion 4/5    Left Ankle Dorsiflexion 4/5      Transfers   Transfers Sit to Stand;Stand to Sit    Sit to Stand 7: Independent;With upper extremity assist    Stand to Sit 7: Independent;With upper extremity assist;Uncontrolled descent      Balance   Balance Assessed Yes      Static Standing Balance   Static Standing - Balance Support No upper extremity supported    Static Standing - Level of Assistance 5: Stand by assistance    Static Standing Balance -  Activities  Romberg - Eyes Opened;Sharpened Romberg - Eyes Open    Static Standing - Comment/# of Minutes 30 seconds on all; increased sway with sharpened rhomberg                        Objective measurements completed on examination: See above findings.       [facility] Adult PT Treatment/Exercise - 07/03/21 0001       Exercises   Exercises Knee/Hip      Knee/Hip Exercises: Seated   Long Arc Quad Both;20 reps    Other Seated Knee/Hip Exercises Towel squeeze   BUE; 1 minute each   Marching Both;20 reps                          PT Long Term Goals - 07/03/21 1312       PT LONG TERM GOAL #1   Title Patient will be independent with her HEP.  Time 6    Period Weeks    Status New    Target Date 08/14/21      PT LONG TERM GOAL #2   Title Patient will be able to walk at least 15 minutes for improved function shopping.    Baseline less than 5 minutes    Time 6    Period Weeks    Status New    Target Date 08/14/21      PT LONG TERM GOAL #3   Title Patient will be able to transfer from sitting to standing without upper extremity  assistance.    Time 6    Period Weeks    Status New    Target Date 08/14/21      PT LONG TERM GOAL #4   Title Patient will be able to stand for at least 20 minutes for improved function cooking.    Baseline less than 5 minutes    Time 6    Period Weeks    Status New    Target Date 08/14/21                    Plan - 07/03/21 1124     Clinical Impression Statement Patient is a 75 year old female presenting to physical therapy following a TIA on 06/22/21. She presented with global upper and lower extremity weakness with her right side slightly weaker than the left. She also fatigued very quickly as she required a seated rest break after walking less than 100 feet. Recommend that she continue with her recommended plan of care to address her impairments to safely return to her prior level of function.    Personal Factors and Comorbidities Comorbidity 1;Comorbidity 2;Transportation    Comorbidities history of TIA, osteoporosis, HTN, asthma    Examination-Activity Limitations Locomotion Level;Transfers;Carry;Stand    Examination-Participation Restrictions Other;Community Activity    Stability/Clinical Decision Making Evolving/Moderate complexity    Clinical Decision Making Moderate    Rehab Potential Good    PT Frequency 2x / week    PT Duration 6 weeks    PT Treatment/Interventions Functional mobility training;Therapeutic activities;Therapeutic exercise;Balance training;Neuromuscular re-education;Patient/family education;Energy conservation    PT Next Visit Plan recumbent bike, lower extremity strengthening    PT Home Exercise Plan Access Code: [email or web address]  URL: [email or web address]  Date: 07/03/2021  Prepared by: Candi Leash    Exercises  Towel Roll Squeeze - 2 x daily - 7 x weekly - 3 sets - 10 reps  Seated Long Arc Quad - 2 x daily - 7 x weekly - 3 sets - 10 reps  Seated March - 2 x daily - 7 x weekly - 3 sets - 10 reps    Consulted and Agree with Plan of Care  Patient             Patient will benefit from skilled therapeutic intervention in order to improve the following deficits and impairments:  Difficulty walking, Decreased endurance, Pain, Decreased activity tolerance, Decreased balance, Decreased strength  Visit Diagnosis: Muscle weakness (generalized)     Problem List Patient Active Problem List   Diagnosis Date Noted   TIA (transient ischemic attack) 06/22/2021   Asthma, chronic, moderate persistent, uncomplicated 01/24/2020   Essential hypertension 05/18/2019   Abnormal EKG 05/18/2019   Osteopenia 03/11/2013   Hyperlipidemia 03/11/2013   Hypothyroidism 11/22/2012   Asthma, chronic 11/22/2012    Granville Lewis, PT 07/03/2021, 1:17 PM  [facility] [address] [place], [place], [address] Phone:  [phone]   Fax:  [phone]  Name: MARIENE DICKERMAN MRN: [id number] Date of Birth: 10-27-1945

## 2021-07-04 ENCOUNTER — Telehealth (HOSPITAL_COMMUNITY): Payer: Self-pay | Admitting: Pharmacist

## 2021-07-04 NOTE — Telephone Encounter (Signed)
LVM

## 2021-07-08 ENCOUNTER — Ambulatory Visit: Payer: Medicare Other

## 2021-07-08 ENCOUNTER — Other Ambulatory Visit: Payer: Self-pay

## 2021-07-08 ENCOUNTER — Telehealth (HOSPITAL_COMMUNITY): Payer: Self-pay | Admitting: Pharmacist

## 2021-07-08 DIAGNOSIS — G459 Transient cerebral ischemic attack, unspecified: Secondary | ICD-10-CM | POA: Diagnosis not present

## 2021-07-08 DIAGNOSIS — M6281 Muscle weakness (generalized): Secondary | ICD-10-CM

## 2021-07-08 NOTE — Therapy (Signed)
[facility] [address] [place], [place], [address] Phone: [phone]   Fax:  [phone]  Physical Therapy Treatment  Patient Details  Name: [name] MRN: [id number] Date of Birth: 1946/05/22 Referring Provider (PT): Nelson Chimes   Encounter Date: 07/08/2021   PT End of Session - 07/08/21 1033     Visit Number 2    Number of Visits 12    Date for PT Re-Evaluation 08/30/21    PT Start Time 1030    PT Stop Time 1114    PT Time Calculation (min) 44 min    Activity Tolerance Patient tolerated treatment well    Behavior During Therapy [facility] for tasks assessed/performed             Past Medical History:  Diagnosis Date   Allergy    seasonal allergies   Asthma    uses inhaler    Colon polyps    GERD (gastroesophageal reflux disease)    with certain foods   Hyperlipidemia    on meds   Hypertension    on meds   Menopausal state    Osteopenia    Rhinitis    Thyroid disease    on meds   Vitamin D deficiency disease     Past Surgical History:  Procedure Laterality Date   CHOLECYSTECTOMY  2005   COLONOSCOPY  2099   CG-F/V-miralax(exc)-TA   TONSILLECTOMY      There were no vitals filed for this visit.   Subjective Assessment - 07/08/21 1032     Subjective Patient reports that she feels better today. She notes that she still has difficulty walking straight.    Pertinent History osteoporosis    Limitations Walking    How long can you walk comfortably? about 20 feet    Patient Stated Goals be independent, get stronger, drive her car (MD told her to wait 1 month prior to driving)    Currently in Pain? No/denies    Pain Onset 1 to 4 weeks ago                               [facility] Adult PT Treatment/Exercise - 07/08/21 0001       Knee/Hip Exercises: Aerobic   Nustep L5 x 10 minutes      Knee/Hip Exercises: Standing   Heel Raises Both   alternating between heel and toe raises; 2 minutes   Knee  Flexion Both   2 minutes     Knee/Hip Exercises: Seated   Long Arc Quad Both   30 reps   Clamshell with TheraBand Red   3 minutes   Other Seated Knee/Hip Exercises Towel squeeze   1 minute each; BUE   Other Seated Knee/Hip Exercises Hip internal rotation   2 minutes   Marching Both   30 reps   Sit to Sand 20 reps;without UE support   to elevated table                Balance Exercises - 07/08/21 0001       Balance Exercises: Standing   Tandem Stance Eyes open;Foam/compliant surface;Intermittent upper extremity support;4 reps;30 secs   greater difficulty with LLE leading (required 1 finger assistance)   Marching Foam/compliant surface;Upper extremity assist 2;Static   2 minutes                    PT Long Term Goals - 07/03/21 1312  PT LONG TERM GOAL #1   Title Patient will be independent with her HEP.    Time 6    Period Weeks    Status New    Target Date 08/14/21      PT LONG TERM GOAL #2   Title Patient will be able to walk at least 15 minutes for improved function shopping.    Baseline less than 5 minutes    Time 6    Period Weeks    Status New    Target Date 08/14/21      PT LONG TERM GOAL #3   Title Patient will be able to transfer from sitting to standing without upper extremity assistance.    Time 6    Period Weeks    Status New    Target Date 08/14/21      PT LONG TERM GOAL #4   Title Patient will be able to stand for at least 20 minutes for improved function cooking.    Baseline less than 5 minutes    Time 6    Period Weeks    Status New    Target Date 08/14/21                   Plan - 07/08/21 1033     Clinical Impression Statement Patient presented to her first follow up with excellent recall of her HEP. She required minimal cuing with each of these interventions for proper pacing to prevent compensation from surrounding musculature. She was introduced to multiple new interventions for improved lower extremity  strengtheing. She experienced the most difficulty with tandem balance when leading with the left lower extremity as she required close supervision and fingertip assistance with this side. However, she required no upper extremity assistance with the right lower extremity leading. She reported feeling tired upon the conclusion of treatment. She continues to require skilled physical therapy to address her remaining impairments to safely return to her prior level of function.    Personal Factors and Comorbidities Comorbidity 1;Comorbidity 2;Transportation    Comorbidities history of TIA, osteoporosis, HTN, asthma    Examination-Activity Limitations Locomotion Level;Transfers;Carry;Stand    Examination-Participation Restrictions Other;Community Activity    Stability/Clinical Decision Making Evolving/Moderate complexity    Rehab Potential Good    PT Frequency 2x / week    PT Duration 6 weeks    PT Treatment/Interventions Functional mobility training;Therapeutic activities;Therapeutic exercise;Balance training;Neuromuscular re-education;Patient/family education;Energy conservation    PT Next Visit Plan recumbent bike, lower extremity strengthening    PT Home Exercise Plan Access Code: [email or web address]  URL: [email or web address]  Date: 07/03/2021  Prepared by: Candi Leash    Exercises  Towel Roll Squeeze - 2 x daily - 7 x weekly - 3 sets - 10 reps  Seated Long Arc Quad - 2 x daily - 7 x weekly - 3 sets - 10 reps  Seated March - 2 x daily - 7 x weekly - 3 sets - 10 reps    Consulted and Agree with Plan of Care Patient             Patient will benefit from skilled therapeutic intervention in order to improve the following deficits and impairments:  Difficulty walking, Decreased endurance, Pain, Decreased activity tolerance, Decreased balance, Decreased strength  Visit Diagnosis: Muscle weakness (generalized)     Problem List Patient Active Problem List   Diagnosis Date Noted   TIA  (transient ischemic attack) 06/22/2021   Asthma, chronic, moderate persistent, uncomplicated 01/24/2020   Essential hypertension  05/18/2019   Abnormal EKG 05/18/2019   Osteopenia 03/11/2013   Hyperlipidemia 03/11/2013   Hypothyroidism 11/22/2012   Asthma, chronic 11/22/2012    Granville Lewis, PT 07/08/2021, 11:15 AM  [facility] [address] [place], [place], [address] Phone: [phone]   Fax:  [phone]  Name: [name] MRN: [id number] Date of Birth: 06-05-1946

## 2021-07-08 NOTE — Telephone Encounter (Signed)
2nd attempt

## 2021-07-10 ENCOUNTER — Ambulatory Visit: Payer: Medicare Other

## 2021-07-10 ENCOUNTER — Other Ambulatory Visit: Payer: Self-pay

## 2021-07-10 ENCOUNTER — Telehealth (HOSPITAL_COMMUNITY): Payer: Self-pay | Admitting: Pharmacist

## 2021-07-10 DIAGNOSIS — G459 Transient cerebral ischemic attack, unspecified: Secondary | ICD-10-CM | POA: Diagnosis not present

## 2021-07-10 DIAGNOSIS — M6281 Muscle weakness (generalized): Secondary | ICD-10-CM

## 2021-07-10 NOTE — Telephone Encounter (Signed)
3rd attempt

## 2021-07-10 NOTE — Therapy (Signed)
[facility] [address] [place], [place], [address] Phone: [phone]   Fax:  [phone]  Physical Therapy Treatment  Patient Details  Name: [name] MRN: [id number] Date of Birth: 02/17/1946 Referring Provider (PT): Nelson Chimes   Encounter Date: 07/10/2021   PT End of Session - 07/10/21 1033     Visit Number 3    Number of Visits 12    Date for PT Re-Evaluation 08/30/21    PT Start Time 1030    PT Stop Time 1113    PT Time Calculation (min) 43 min    Activity Tolerance Patient tolerated treatment well    Behavior During Therapy [facility] for tasks assessed/performed             Past Medical History:  Diagnosis Date   Allergy    seasonal allergies   Asthma    uses inhaler    Colon polyps    GERD (gastroesophageal reflux disease)    with certain foods   Hyperlipidemia    on meds   Hypertension    on meds   Menopausal state    Osteopenia    Rhinitis    Thyroid disease    on meds   Vitamin D deficiency disease     Past Surgical History:  Procedure Laterality Date   CHOLECYSTECTOMY  2005   COLONOSCOPY  2099   CG-F/V-miralax(exc)-TA   TONSILLECTOMY      There were no vitals filed for this visit.   Subjective Assessment - 07/10/21 1032     Subjective Patient reports that she feels good today. She notes that she has not had any problems since her last appointment.    Pertinent History osteoporosis    Limitations Walking    How long can you walk comfortably? about 20 feet    Patient Stated Goals be independent, get stronger, drive her car (MD told her to wait 1 month prior to driving)    Currently in Pain? No/denies    Pain Onset 1 to 4 weeks ago                               [facility] Adult PT Treatment/Exercise - 07/10/21 0001       Knee/Hip Exercises: Aerobic   Nustep L5 x 12 minutes      Knee/Hip Exercises: Standing   Lateral Step Up Both;Hand Hold: 2;Step Height: 6"   2 minutes    Forward Step Up Both;Hand Hold: 2;Step Height: 6"   2 minutes; alternating   Rocker Board 3 minutes      Knee/Hip Exercises: Seated   Long Arc Quad Strengthening;Both;15 reps;2 sets;Weights    Long Arc Quad Weight 2 lbs.    Sit to Sand 20 reps;without UE support   at elevated table; cuing for controlled sitting                Balance Exercises - 07/10/21 0001       Balance Exercises: Standing   Tandem Stance Eyes open;Foam/compliant surface;Intermittent upper extremity support;4 reps;30 secs    Marching Foam/compliant surface;Upper extremity assist 2;Static   3 minutes                    PT Long Term Goals - 07/03/21 1312       PT LONG TERM GOAL #1   Title Patient will be independent with her HEP.    Time 6  Period Weeks    Status New    Target Date 08/14/21      PT LONG TERM GOAL #2   Title Patient will be able to walk at least 15 minutes for improved function shopping.    Baseline less than 5 minutes    Time 6    Period Weeks    Status New    Target Date 08/14/21      PT LONG TERM GOAL #3   Title Patient will be able to transfer from sitting to standing without upper extremity assistance.    Time 6    Period Weeks    Status New    Target Date 08/14/21      PT LONG TERM GOAL #4   Title Patient will be able to stand for at least 20 minutes for improved function cooking.    Baseline less than 5 minutes    Time 6    Period Weeks    Status New    Target Date 08/14/21                   Plan - 07/10/21 1035     Clinical Impression Statement Patient was introduced to multiple new interventions with moderate difficulty and fatigue. She required minimal cuing with today's new interventions initially for proper biomechanics, but she was then able to maintain proper form throughout. She was able to demonstrate improved lower extremity stability with tandem stance today as she did not require upper extremity assistance until the last  repitition with each lower extremity leading. She reported feeling tired, but good otherwise upon the conclusion of treatment. She continues to require skilled physical therapy to address her remaining impairments to return to her prior level of function.    Personal Factors and Comorbidities Comorbidity 1;Comorbidity 2;Transportation    Comorbidities history of TIA, osteoporosis, HTN, asthma    Examination-Activity Limitations Locomotion Level;Transfers;Carry;Stand    Examination-Participation Restrictions Other;Community Activity    Stability/Clinical Decision Making Evolving/Moderate complexity    Rehab Potential Good    PT Frequency 2x / week    PT Duration 6 weeks    PT Treatment/Interventions Functional mobility training;Therapeutic activities;Therapeutic exercise;Balance training;Neuromuscular re-education;Patient/family education;Energy conservation    PT Next Visit Plan recumbent bike, lower extremity strengthening    PT Home Exercise Plan Access Code: [email or web address]  URL: [email or web address]  Date: 07/03/2021  Prepared by: Candi Leash    Exercises  Towel Roll Squeeze - 2 x daily - 7 x weekly - 3 sets - 10 reps  Seated Long Arc Quad - 2 x daily - 7 x weekly - 3 sets - 10 reps  Seated March - 2 x daily - 7 x weekly - 3 sets - 10 reps    Consulted and Agree with Plan of Care Patient             Patient will benefit from skilled therapeutic intervention in order to improve the following deficits and impairments:  Difficulty walking, Decreased endurance, Pain, Decreased activity tolerance, Decreased balance, Decreased strength  Visit Diagnosis: Muscle weakness (generalized)     Problem List Patient Active Problem List   Diagnosis Date Noted   TIA (transient ischemic attack) 06/22/2021   Asthma, chronic, moderate persistent, uncomplicated 01/24/2020   Essential hypertension 05/18/2019   Abnormal EKG 05/18/2019   Osteopenia 03/11/2013   Hyperlipidemia 03/11/2013    Hypothyroidism 11/22/2012   Asthma, chronic 11/22/2012    Granville Lewis, PT 07/10/2021, 11:25 AM  [facility]  [address] [place], [place], [address] Phone: [phone]   Fax:  [phone]  Name: [name] MRN: [id number] Date of Birth: 10/16/1945

## 2021-07-11 ENCOUNTER — Other Ambulatory Visit (HOSPITAL_COMMUNITY): Payer: Self-pay

## 2021-07-11 ENCOUNTER — Telehealth: Payer: Self-pay | Admitting: Family Medicine

## 2021-07-11 NOTE — Telephone Encounter (Signed)
Patient aware that refill has been sent to [facility] on 06/27/21.

## 2021-07-15 ENCOUNTER — Encounter: Payer: Self-pay | Admitting: Physical Therapy

## 2021-07-15 ENCOUNTER — Ambulatory Visit: Payer: Medicare Other | Admitting: Physical Therapy

## 2021-07-15 ENCOUNTER — Other Ambulatory Visit: Payer: Self-pay

## 2021-07-15 DIAGNOSIS — M6281 Muscle weakness (generalized): Secondary | ICD-10-CM | POA: Diagnosis not present

## 2021-07-15 DIAGNOSIS — G459 Transient cerebral ischemic attack, unspecified: Secondary | ICD-10-CM | POA: Diagnosis not present

## 2021-07-15 NOTE — Therapy (Signed)
[facility] [address] [place], [place], [address] Phone: [phone]   Fax:  [phone]  Physical Therapy Treatment  Patient Details  Name: [name] MRN: [id number] Date of Birth: 05/02/1946 Referring Provider (PT): Nelson Chimes   Encounter Date: 07/15/2021   PT End of Session - 07/15/21 1048     Visit Number 4    Number of Visits 12    Date for PT Re-Evaluation 08/30/21    PT Start Time 1031    PT Stop Time 1111    PT Time Calculation (min) 40 min    Activity Tolerance Patient tolerated treatment well    Behavior During Therapy [facility] for tasks assessed/performed             Past Medical History:  Diagnosis Date   Allergy    seasonal allergies   Asthma    uses inhaler    Colon polyps    GERD (gastroesophageal reflux disease)    with certain foods   Hyperlipidemia    on meds   Hypertension    on meds   Menopausal state    Osteopenia    Rhinitis    Thyroid disease    on meds   Vitamin D deficiency disease     Past Surgical History:  Procedure Laterality Date   CHOLECYSTECTOMY  2005   COLONOSCOPY  2099   CG-F/V-miralax(exc)-TA   TONSILLECTOMY      There were no vitals filed for this visit.   Subjective Assessment - 07/15/21 1038     Subjective Patient reports that she feels good today. She has a little R hip discomfort. Reports main limitation is getting tired easily.    Pertinent History osteoporosis    Limitations Walking    How long can you walk comfortably? about 20 feet    Patient Stated Goals be independent, get stronger, drive her car (MD told her to wait 1 month prior to driving)    Currently in Pain? Yes    Pain Score 2     Pain Location Hip    Pain Orientation Right    Pain Descriptors / Indicators Sore    Pain Type Acute pain    Pain Onset More than a month ago    Pain Frequency Intermittent    Aggravating Factors  [place]orn[place]                [facility] PT Assessment - 07/15/21 0001        Assessment   Medical Diagnosis History of TIA    Referring Provider (PT) Amin    Onset Date/Surgical Date 06/22/21    Hand Dominance Right    Next MD Visit 09/26/21    Prior Therapy No      Precautions   Precautions None      Restrictions   Weight Bearing Restrictions No    Other Position/Activity Restrictions Avoid driving for 1 month                           [facility] Adult PT Treatment/Exercise - 07/15/21 0001       Knee/Hip Exercises: Aerobic   Nustep L4 x17 min      Knee/Hip Exercises: Standing   Heel Raises Both;20 reps    Heel Raises Limitations B toe raise x20 reps    Hip Abduction AROM;Both;15 reps;Knee straight    Forward Step Up Both;15 reps;Hand Hold: 1;Step Height: 6"      Knee/Hip Exercises:  Seated   Long Arc Quad Strengthening;Both;15 reps;2 sets;Weights    Long Arc Quad Weight 3 lbs.    Clamshell with TheraBand Red   x20 reps   Marching Strengthening;Both;20 reps;Weights    Marching Limitations red theraband    Hamstring Curl Strengthening;Both;20 reps;Limitations    Hamstring Limitations red theraband    Sit to Sand 15 reps;without UE support                          PT Long Term Goals - 07/03/21 1312       PT LONG TERM GOAL #1   Title Patient will be independent with her HEP.    Time 6    Period Weeks    Status New    Target Date 08/14/21      PT LONG TERM GOAL #2   Title Patient will be able to walk at least 15 minutes for improved function shopping.    Baseline less than 5 minutes    Time 6    Period Weeks    Status New    Target Date 08/14/21      PT LONG TERM GOAL #3   Title Patient will be able to transfer from sitting to standing without upper extremity assistance.    Time 6    Period Weeks    Status New    Target Date 08/14/21      PT LONG TERM GOAL #4   Title Patient will be able to stand for at least 20 minutes for improved function cooking.    Baseline less than 5 minutes    Time 6     Period Weeks    Status New    Target Date 08/14/21                   Plan - 07/15/21 1113     Clinical Impression Statement Patient presented in clinic with reports of minial R hip soreness. Patient able to tolerate all progression of strengthening with no complaints of pain. Patient required only minimal VCs and tactile cues to promote proper technique. Patient reported min to mod fatigue following therex session. No increased hip soreness reported by end of treatment.    Personal Factors and Comorbidities Comorbidity 1;Comorbidity 2;Transportation    Comorbidities history of TIA, osteoporosis, HTN, asthma    Examination-Activity Limitations Locomotion Level;Transfers;Carry;Stand    Examination-Participation Restrictions Other;Community Activity    Stability/Clinical Decision Making Evolving/Moderate complexity    Rehab Potential Good    PT Frequency 2x / week    PT Duration 6 weeks    PT Treatment/Interventions Functional mobility training;Therapeutic activities;Therapeutic exercise;Balance training;Neuromuscular re-education;Patient/family education;Energy conservation    PT Next Visit Plan recumbent bike, lower extremity strengthening    PT Home Exercise Plan Access Code: [email or web address]  URL: [email or web address]  Date: 07/03/2021  Prepared by: Candi Leash    Exercises  Towel Roll Squeeze - 2 x daily - 7 x weekly - 3 sets - 10 reps  Seated Long Arc Quad - 2 x daily - 7 x weekly - 3 sets - 10 reps  Seated March - 2 x daily - 7 x weekly - 3 sets - 10 reps    Consulted and Agree with Plan of Care Patient             Patient will benefit from skilled therapeutic intervention in order to improve the following deficits and impairments:  Difficulty walking, Decreased endurance, Pain, Decreased activity tolerance,  Decreased balance, Decreased strength  Visit Diagnosis: Muscle weakness (generalized)     Problem List Patient Active Problem List   Diagnosis Date  Noted   TIA (transient ischemic attack) 06/22/2021   Asthma, chronic, moderate persistent, uncomplicated 01/24/2020   Essential hypertension 05/18/2019   Abnormal EKG 05/18/2019   Osteopenia 03/11/2013   Hyperlipidemia 03/11/2013   Hypothyroidism 11/22/2012   Asthma, chronic 11/22/2012    Marvell Fuller, PTA 07/15/2021, 11:16 AM  [facility] [address] [place], [place], [address] Phone: [phone]   Fax:  [phone]  Name: ROISE EMERT MRN: [id number] Date of Birth: [birth year]

## 2021-07-22 ENCOUNTER — Ambulatory Visit: Payer: Medicare Other

## 2021-07-22 ENCOUNTER — Other Ambulatory Visit: Payer: Self-pay

## 2021-07-22 DIAGNOSIS — M6281 Muscle weakness (generalized): Secondary | ICD-10-CM | POA: Diagnosis not present

## 2021-07-22 DIAGNOSIS — G459 Transient cerebral ischemic attack, unspecified: Secondary | ICD-10-CM | POA: Diagnosis not present

## 2021-07-22 NOTE — Therapy (Signed)
[facility] [address] [place], [place], [address] Phone: [phone]   Fax:  [phone]  Physical Therapy Treatment  Patient Details  Name: [name] MRN: [id number] Date of Birth: [birth year] Referring Provider (PT): Nelson Chimes   Encounter Date: 07/22/2021   PT End of Session - 07/22/21 1023     Visit Number 5    Number of Visits 12    Date for PT Re-Evaluation 08/30/21    PT Start Time 1030    PT Stop Time 1110    PT Time Calculation (min) 40 min    Activity Tolerance Patient tolerated treatment well    Behavior During Therapy [facility] for tasks assessed/performed             Past Medical History:  Diagnosis Date   Allergy    seasonal allergies   Asthma    uses inhaler    Colon polyps    GERD (gastroesophageal reflux disease)    with certain foods   Hyperlipidemia    on meds   Hypertension    on meds   Menopausal state    Osteopenia    Rhinitis    Thyroid disease    on meds   Vitamin D deficiency disease     Past Surgical History:  Procedure Laterality Date   CHOLECYSTECTOMY  2005   COLONOSCOPY  2099   CG-F/V-miralax(exc)-TA   TONSILLECTOMY      There were no vitals filed for this visit.   Subjective Assessment - 07/22/21 1022     Subjective Pt arrives for today's treatment denying any pain.    Pertinent History osteoporosis    Limitations Walking    How long can you walk comfortably? about 20 feet    Patient Stated Goals be independent, get stronger, drive her car (MD told her to wait 1 month prior to driving)    Currently in Pain? No/denies    Pain Onset More than a month ago                               [facility] Adult PT Treatment/Exercise - 07/22/21 0001       Knee/Hip Exercises: Aerobic   Nustep Lvl 4 x 15 mins      Knee/Hip Exercises: Standing   Heel Raises Both;20 reps    Heel Raises Limitations B toe raise x20 reps    Hip Abduction Both;Knee straight;20 reps     Abduction Limitations Cues for posture and avoid lean    Hip Extension Both;20 reps   Cues for avoid lean   Lateral Step Up Both;20 reps;Hand Hold: 2;Step Height: 6"    Forward Step Up Both;20 reps;Hand Hold: 1;Step Height: 6"      Knee/Hip Exercises: Seated   Long Arc Quad Strengthening;Both;2 sets;15 reps;Weights    Long Arc Quad Weight 3 lbs.    Ball Squeeze 3 mins    Clamshell with TheraBand Red   3 mins   Marching Strengthening;20 reps;Weights    Marching Weights 3 lbs.    Hamstring Curl Strengthening;Both;20 reps    Hamstring Limitations red tband    Sit to Sand 15 reps;without UE support                          PT Long Term Goals - 07/22/21 1032       PT LONG TERM GOAL #1   Title Patient will  be independent with her HEP.    Time 6    Period Weeks    Status On-going      PT LONG TERM GOAL #2   Title Patient will be able to walk at least 15 minutes for improved function shopping.    Baseline less than 5 minutes; hasn't tried yet 11/28    Time 6    Period Weeks    Status On-going      PT LONG TERM GOAL #3   Title Patient will be able to transfer from sitting to standing without upper extremity assistance.    Baseline 11/28 use UE intermittently    Time 6    Period Weeks    Status On-going      PT LONG TERM GOAL #4   Title Patient will be able to stand for at least 20 minutes for improved function cooking.    Baseline less than 5 minutes; 20 mins 11/28    Time 6    Period Weeks    Status Achieved                   Plan - 07/22/21 1023     Clinical Impression Statement Pt arrives for today's treatment session denying any pain.  Pt able to tolerate addition of reps to numerous exercises throughout treatment without increase in pain.  Pt instructed in standing hip extension with good carryover, minimal cues required for upright posture and to avoid leaning.  Pt denied any pain at completion of today's treatment session.    Personal  Factors and Comorbidities Comorbidity 1;Comorbidity 2;Transportation    Comorbidities history of TIA, osteoporosis, HTN, asthma    Examination-Activity Limitations Locomotion Level;Transfers;Carry;Stand    Examination-Participation Restrictions Other;Community Activity    Stability/Clinical Decision Making Evolving/Moderate complexity    Rehab Potential Good    PT Frequency 2x / week    PT Duration 6 weeks    PT Treatment/Interventions Functional mobility training;Therapeutic activities;Therapeutic exercise;Balance training;Neuromuscular re-education;Patient/family education;Energy conservation    PT Next Visit Plan recumbent bike, lower extremity strengthening    PT Home Exercise Plan Access Code: [email or web address]  URL: [email or web address]  Date: 07/03/2021  Prepared by: Candi Leash    Exercises  Towel Roll Squeeze - 2 x daily - 7 x weekly - 3 sets - 10 reps  Seated Long Arc Quad - 2 x daily - 7 x weekly - 3 sets - 10 reps  Seated March - 2 x daily - 7 x weekly - 3 sets - 10 reps    Consulted and Agree with Plan of Care Patient             Patient will benefit from skilled therapeutic intervention in order to improve the following deficits and impairments:  Difficulty walking, Decreased endurance, Pain, Decreased activity tolerance, Decreased balance, Decreased strength  Visit Diagnosis: Muscle weakness (generalized)     Problem List Patient Active Problem List   Diagnosis Date Noted   TIA (transient ischemic attack) 06/22/2021   Asthma, chronic, moderate persistent, uncomplicated 01/24/2020   Essential hypertension 05/18/2019   Abnormal EKG 05/18/2019   Osteopenia 03/11/2013   Hyperlipidemia 03/11/2013   Hypothyroidism 11/22/2012   Asthma, chronic 11/22/2012    Newman Pies, PTA 07/22/2021, 11:12 AM  [facility] [address] [place], [place], [address] Phone: [phone]   Fax:  [phone]  Name: [name] MRN: [id number] Date of Birth: February 03, 1946

## 2021-07-24 ENCOUNTER — Other Ambulatory Visit: Payer: Self-pay

## 2021-07-24 ENCOUNTER — Ambulatory Visit: Payer: Medicare Other

## 2021-07-24 DIAGNOSIS — G459 Transient cerebral ischemic attack, unspecified: Secondary | ICD-10-CM | POA: Diagnosis not present

## 2021-07-24 DIAGNOSIS — M6281 Muscle weakness (generalized): Secondary | ICD-10-CM

## 2021-07-24 NOTE — Therapy (Signed)
[facility] [address] [place], [place], [address] Phone: [phone]   Fax:  [phone]  Physical Therapy Treatment  Patient Details  Name: [name] MRN: [id number] Date of Birth: April 17, 1946 Referring Provider (PT): Nelson Chimes   Encounter Date: 07/24/2021   PT End of Session - 07/24/21 1042     Visit Number 6    Number of Visits 12    Date for PT Re-Evaluation 08/30/21    PT Start Time 1030    PT Stop Time 1111    PT Time Calculation (min) 41 min    Activity Tolerance Patient tolerated treatment well    Behavior During Therapy [facility] for tasks assessed/performed             Past Medical History:  Diagnosis Date   Allergy    seasonal allergies   Asthma    uses inhaler    Colon polyps    GERD (gastroesophageal reflux disease)    with certain foods   Hyperlipidemia    on meds   Hypertension    on meds   Menopausal state    Osteopenia    Rhinitis    Thyroid disease    on meds   Vitamin D deficiency disease     Past Surgical History:  Procedure Laterality Date   CHOLECYSTECTOMY  2005   COLONOSCOPY  2099   CG-F/V-miralax(exc)-TA   TONSILLECTOMY      There were no vitals filed for this visit.   Subjective Assessment - 07/24/21 1041     Subjective Pt arrives to today's treatment session reporting 2/10 left knee pain that eases with exercises.    Pertinent History osteoporosis    Limitations Walking    How long can you walk comfortably? about 20 feet    Patient Stated Goals be independent, get stronger, drive her car (MD told her to wait 1 month prior to driving)    Currently in Pain? Yes    Pain Score 2     Pain Location Knee    Pain Orientation Left    Pain Onset More than a month ago                               [facility] Adult PT Treatment/Exercise - 07/24/21 0001       Knee/Hip Exercises: Aerobic   Nustep Lvl 4 x 15 mins      Knee/Hip Exercises: Standing   Heel Raises Both;20  reps    Heel Raises Limitations B toe raise x20 reps    Hip Abduction Both;Knee straight;20 reps    Abduction Limitations Cues for avoiding lean    Hip Extension Both;20 reps    Extension Limitations Cues to avoid leaning    Lateral Step Up Both;20 reps;Hand Hold: 2;Step Height: 6"    Forward Step Up Both;20 reps;Hand Hold: 1;Step Height: 6"      Knee/Hip Exercises: Seated   Long Arc Quad Strengthening;Both;2 sets;15 reps;Weights    Long Arc Quad Weight 3 lbs.    Ball Squeeze 20 reps with 3 sec hold    Clamshell with TheraBand Red   20 reps with 3 sec hold   Marching Strengthening;20 reps;Weights    Marching Weights 3 lbs.    Hamstring Curl Strengthening;Both;20 reps    Hamstring Limitations red tband    Sit to Sand 20 reps;without UE support  PT Long Term Goals - 07/22/21 1032       PT LONG TERM GOAL #1   Title Patient will be independent with her HEP.    Time 6    Period Weeks    Status On-going      PT LONG TERM GOAL #2   Title Patient will be able to walk at least 15 minutes for improved function shopping.    Baseline less than 5 minutes; hasn't tried yet 11/28    Time 6    Period Weeks    Status On-going      PT LONG TERM GOAL #3   Title Patient will be able to transfer from sitting to standing without upper extremity assistance.    Baseline 11/28 use UE intermittently    Time 6    Period Weeks    Status On-going      PT LONG TERM GOAL #4   Title Patient will be able to stand for at least 20 minutes for improved function cooking.    Baseline less than 5 minutes; 20 mins 11/28    Time 6    Period Weeks    Status Achieved                   Plan - 07/24/21 1042     Clinical Impression Statement Pt arrives for today's treamtent session reporting 2/10 left knee pain that subsides during treatment session.  Pt requiring several seated rest breaks throughout treatment session due to fatigue, but able to perform all  exercises and reps asked of her.  Pt requiring min cues for posture, but demonstrates good carryover from last treatment session.  Pt denies any pain at completion of today's treatment session.    Personal Factors and Comorbidities Comorbidity 1;Comorbidity 2;Transportation    Comorbidities history of TIA, osteoporosis, HTN, asthma    Examination-Activity Limitations Locomotion Level;Transfers;Carry;Stand    Examination-Participation Restrictions Other;Community Activity    Stability/Clinical Decision Making Evolving/Moderate complexity    Rehab Potential Good    PT Frequency 2x / week    PT Duration 6 weeks    PT Treatment/Interventions Functional mobility training;Therapeutic activities;Therapeutic exercise;Balance training;Neuromuscular re-education;Patient/family education;Energy conservation    PT Next Visit Plan recumbent bike, lower extremity strengthening    PT Home Exercise Plan Access Code: [email or web address]  URL: [email or web address]  Date: 07/03/2021  Prepared by: Candi Leash    Exercises  Towel Roll Squeeze - 2 x daily - 7 x weekly - 3 sets - 10 reps  Seated Long Arc Quad - 2 x daily - 7 x weekly - 3 sets - 10 reps  Seated March - 2 x daily - 7 x weekly - 3 sets - 10 reps    Consulted and Agree with Plan of Care Patient             Patient will benefit from skilled therapeutic intervention in order to improve the following deficits and impairments:  Difficulty walking, Decreased endurance, Pain, Decreased activity tolerance, Decreased balance, Decreased strength  Visit Diagnosis: Muscle weakness (generalized)     Problem List Patient Active Problem List   Diagnosis Date Noted   TIA (transient ischemic attack) 06/22/2021   Asthma, chronic, moderate persistent, uncomplicated 01/24/2020   Essential hypertension 05/18/2019   Abnormal EKG 05/18/2019   Osteopenia 03/11/2013   Hyperlipidemia 03/11/2013   Hypothyroidism 11/22/2012   Asthma, chronic 11/22/2012     Newman Pies, PTA 07/24/2021, 11:12 AM  [facility] [address]  [address] [place], [place], [address] Phone: [phone]   Fax:  [phone]  Name: [name] MRN: [id number] Date of Birth: 01-15-1946

## 2021-07-29 ENCOUNTER — Ambulatory Visit: Payer: Medicare Other | Attending: Internal Medicine | Admitting: Physical Therapy

## 2021-07-29 ENCOUNTER — Other Ambulatory Visit: Payer: Self-pay

## 2021-07-29 ENCOUNTER — Encounter: Payer: Self-pay | Admitting: Physical Therapy

## 2021-07-29 DIAGNOSIS — M6281 Muscle weakness (generalized): Secondary | ICD-10-CM | POA: Diagnosis not present

## 2021-07-29 NOTE — Therapy (Signed)
[facility] [address] [place], [place], [address] Phone: [phone]   Fax:  [phone]  Physical Therapy Treatment  Patient Details  Name: [name] MRN: [id number] Date of Birth: 1946-07-01 Referring Provider (PT): Nelson Chimes   Encounter Date: 07/29/2021   PT End of Session - 07/29/21 1026     Visit Number 7    Number of Visits 12    Date for PT Re-Evaluation 08/30/21    PT Start Time 1031    PT Stop Time 1113    PT Time Calculation (min) 42 min    Activity Tolerance Patient tolerated treatment well    Behavior During Therapy [facility] for tasks assessed/performed             Past Medical History:  Diagnosis Date   Allergy    seasonal allergies   Asthma    uses inhaler    Colon polyps    GERD (gastroesophageal reflux disease)    with certain foods   Hyperlipidemia    on meds   Hypertension    on meds   Menopausal state    Osteopenia    Rhinitis    Thyroid disease    on meds   Vitamin D deficiency disease     Past Surgical History:  Procedure Laterality Date   CHOLECYSTECTOMY  2005   COLONOSCOPY  2099   CG-F/V-miralax(exc)-TA   TONSILLECTOMY      There were no vitals filed for this visit.   Subjective Assessment - 07/29/21 1026     Subjective No new complaints. Feeling better and more confident at home.    Pertinent History osteoporosis    Limitations Walking    How long can you walk comfortably? about 20 feet    Patient Stated Goals be independent, get stronger, drive her car (MD told her to wait 1 month prior to driving)    Currently in Pain? Other (Comment)   No pain assessment provided               [facility] PT Assessment - 07/29/21 0001       Assessment   Medical Diagnosis History of TIA    Referring Provider (PT) Nelson Chimes    Onset Date/Surgical Date 06/22/21    Hand Dominance Right    Next MD Visit 09/26/21    Prior Therapy No      Precautions   Precautions None                            [facility] Adult PT Treatment/Exercise - 07/29/21 0001       Knee/Hip Exercises: Aerobic   Nustep L4 x15 min      Knee/Hip Exercises: Standing   Heel Raises Both;20 reps    Heel Raises Limitations B toe raise x20 reps    Hip Flexion Stengthening;Both;20 reps;Knee bent;Limitations    Hip Flexion Limitations 2#    Hip Extension Stengthening;Both;20 reps;Limitations;Knee straight    Extension Limitations red theraband    Other Standing Knee Exercises sidestepping // bars red theraband x5 reps      Knee/Hip Exercises: Seated   Long Arc Quad Strengthening;Both;3 sets;10 reps;Weights    Long Arc Quad Weight 4 lbs.    Sit to Sand 5 reps;without UE support                 Balance Exercises - 07/29/21 0001       Balance Exercises: Standing   Standing  Eyes Opened Narrow base of support (BOS);Foam/compliant surface;Head turns    Tandem Stance Eyes open;Foam/compliant surface;Intermittent upper extremity support;Time    Rockerboard Anterior/posterior;EO;Intermittent UE support    Partial Tandem Stance Eyes open;Foam/compliant surface;Intermittent upper extremity support;1 rep    Sidestepping Foam/compliant support;Upper extremity support                     PT Long Term Goals - 07/22/21 1032       PT LONG TERM GOAL #1   Title Patient will be independent with her HEP.    Time 6    Period Weeks    Status On-going      PT LONG TERM GOAL #2   Title Patient will be able to walk at least 15 minutes for improved function shopping.    Baseline less than 5 minutes; hasn't tried yet 11/28    Time 6    Period Weeks    Status On-going      PT LONG TERM GOAL #3   Title Patient will be able to transfer from sitting to standing without upper extremity assistance.    Baseline 11/28 use UE intermittently    Time 6    Period Weeks    Status On-going      PT LONG TERM GOAL #4   Title Patient will be able to stand for at least 20 minutes for  improved function cooking.    Baseline less than 5 minutes; 20 mins 11/28    Time 6    Period Weeks    Status Achieved                    Patient will benefit from skilled therapeutic intervention in order to improve the following deficits and impairments:     Visit Diagnosis: Muscle weakness (generalized)     Problem List Patient Active Problem List   Diagnosis Date Noted   TIA (transient ischemic attack) 06/22/2021   Asthma, chronic, moderate persistent, uncomplicated 01/24/2020   Essential hypertension 05/18/2019   Abnormal EKG 05/18/2019   Osteopenia 03/11/2013   Hyperlipidemia 03/11/2013   Hypothyroidism 11/22/2012   Asthma, chronic 11/22/2012    Marvell Fuller, PTA 07/29/2021, 11:18 AM  [facility] [address] [place], [place], [address] Phone: [phone]   Fax:  [phone]  Name: [name] MRN: [id number] Date of Birth: 1946/07/31

## 2021-07-31 ENCOUNTER — Encounter: Payer: Medicare Other | Admitting: Physical Therapy

## 2021-08-02 ENCOUNTER — Ambulatory Visit: Payer: Medicare Other | Admitting: *Deleted

## 2021-08-02 ENCOUNTER — Other Ambulatory Visit: Payer: Self-pay

## 2021-08-02 DIAGNOSIS — M6281 Muscle weakness (generalized): Secondary | ICD-10-CM | POA: Diagnosis not present

## 2021-08-02 NOTE — Therapy (Signed)
[facility] [address] [place], [place], [address] Phone: [phone]   Fax:  [phone]  Physical Therapy Treatment  Patient Details  Name: [name] MRN: [id number] Date of Birth: 01-27-1946 Referring Provider (PT): Nelson Chimes   Encounter Date: 08/02/2021   PT End of Session - 08/02/21 1201     Visit Number 8    Number of Visits 12    Date for PT Re-Evaluation 08/30/21    PT Start Time 1115    PT Stop Time 1202    PT Time Calculation (min) 47 min             Past Medical History:  Diagnosis Date   Allergy    seasonal allergies   Asthma    uses inhaler    Colon polyps    GERD (gastroesophageal reflux disease)    with certain foods   Hyperlipidemia    on meds   Hypertension    on meds   Menopausal state    Osteopenia    Rhinitis    Thyroid disease    on meds   Vitamin D deficiency disease     Past Surgical History:  Procedure Laterality Date   CHOLECYSTECTOMY  2005   COLONOSCOPY  2099   CG-F/V-miralax(exc)-TA   TONSILLECTOMY      There were no vitals filed for this visit.   Subjective Assessment - 08/02/21 1116     Subjective No new complaints. Feeling better and more confident at home.    Pertinent History osteoporosis    Limitations Walking    How long can you walk comfortably? about 20 feet    Patient Stated Goals be independent, get stronger, drive her car (MD told her to wait 1 month prior to driving)                               [facility] Adult PT Treatment/Exercise - 08/02/21 0001       Knee/Hip Exercises: Aerobic   Nustep L4 x15 min      Knee/Hip Exercises: Standing   Heel Raises Both;20 reps    Heel Raises Limitations B toe raise x20 reps    Hip Flexion Stengthening;Both;20 reps;Knee bent;Limitations    Hip Abduction Both;Knee straight;20 reps    Lateral Step Up Both;20 reps;Hand Hold: 2;Step Height: 6"    Forward Step Up Both;20 reps;Hand Hold: 1;Step Height: 6"     Rocker Board 3 minutes      Knee/Hip Exercises: Seated   Long Arc Quad Strengthening;Both;3 sets;10 reps;Weights    Long Arc Quad Weight 4 lbs.    Sit to Sand without UE support;10 reps                 Balance Exercises - 08/02/21 0001       Balance Exercises: Standing   Standing Eyes Opened Narrow base of support (BOS);Foam/compliant surface;Head turns    Tandem Stance Eyes open;Foam/compliant surface;Intermittent upper extremity support;Time    Rockerboard Anterior/posterior;EO;Intermittent UE support;Lateral                     PT Long Term Goals - 07/22/21 1032       PT LONG TERM GOAL #1   Title Patient will be independent with her HEP.    Time 6    Period Weeks    Status On-going      PT LONG TERM GOAL #2   Title  Patient will be able to walk at least 15 minutes for improved function shopping.    Baseline less than 5 minutes; hasn't tried yet 11/28    Time 6    Period Weeks    Status On-going      PT LONG TERM GOAL #3   Title Patient will be able to transfer from sitting to standing without upper extremity assistance.    Baseline 11/28 use UE intermittently    Time 6    Period Weeks    Status On-going      PT LONG TERM GOAL #4   Title Patient will be able to stand for at least 20 minutes for improved function cooking.    Baseline less than 5 minutes; 20 mins 11/28    Time 6    Period Weeks    Status Achieved                   Plan - 08/02/21 1202     Clinical Impression Statement Pt arrived todayfeeling better about strength and balance and is moving with more confidence. Pt did well with Rx and found lateral rockerboard to be the most challenging.[employer]ie [employer] job today    Personal Factors and Comorbidities Comorbidity 1;Comorbidity 2;Transportation    Comorbidities history of TIA, osteoporosis, HTN, asthma    Examination-Activity Limitations Locomotion Level;Transfers;Carry;Stand    Examination-Participation Restrictions  Other;Community Activity    Stability/Clinical Decision Making Evolving/Moderate complexity    Rehab Potential Good    PT Frequency 2x / week    PT Duration 6 weeks    PT Treatment/Interventions Functional mobility training;Therapeutic activities;Therapeutic exercise;Balance training;Neuromuscular re-education;Patient/family education;Energy conservation    PT Next Visit Plan recumbent bike, lower extremity strengthening    Consulted and Agree with Plan of Care Patient             Patient will benefit from skilled therapeutic intervention in order to improve the following deficits and impairments:  Difficulty walking, Decreased endurance, Pain, Decreased activity tolerance, Decreased balance, Decreased strength  Visit Diagnosis: Muscle weakness (generalized)     Problem List Patient Active Problem List   Diagnosis Date Noted   TIA (transient ischemic attack) 06/22/2021   Asthma, chronic, moderate persistent, uncomplicated 01/24/2020   Essential hypertension 05/18/2019   Abnormal EKG 05/18/2019   Osteopenia 03/11/2013   Hyperlipidemia 03/11/2013   Hypothyroidism 11/22/2012   Asthma, chronic 11/22/2012    Khadim Lundberg,CHRIS, PTA 08/02/2021, 12:23 PM  [facility] [address] [place], [place], [address] Phone: [phone]   Fax:  [phone]  Name: BERTICE RISSE MRN: [id number] Date of Birth: [birth year]

## 2021-08-05 ENCOUNTER — Other Ambulatory Visit: Payer: Self-pay

## 2021-08-05 ENCOUNTER — Ambulatory Visit: Payer: Medicare Other

## 2021-08-05 DIAGNOSIS — M6281 Muscle weakness (generalized): Secondary | ICD-10-CM | POA: Diagnosis not present

## 2021-08-05 NOTE — Progress Notes (Signed)
[facility] [address] [place]. [place] [address] [phone]       HOSPITAL FOLLOW UP NOTE  [name] Date of Birth:  Apr 16, 1946 Medical Record Number:  [id number]   Reason for Referral:  hospital stroke follow up    SUBJECTIVE:   CHIEF COMPLAINT:  Chief Complaint  Patient presents with   Cerebrovascular Accident    Rm 2, hospital FU  "get tired easily, no specific concerns"    HPI:   [name] is a 75 y.o. female with history of HTN, HLD who presented on 06/22/2021 with transient right hand weakness and aphasia.  Personally reviewed hospitalization pertinent progress notes, lab work and imaging.  Evaluated by Dr. 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  CTA head/neck unremarkable.  EF 60 to 65%.  LDL 105.  A1c 5.1.  Recommended DAPT for 3 weeks and aspirin alone.  HTN stable.  Initiate atorvastatin 40 mg daily.  No prior stroke history.  PT/OT recommended outpatient PT for RLE weakness and discharged home.   Today, 08/06/2021, patient being seen for hospital follow up unaccompanied. Reports doing well - denies new stroke/TIA symptoms. Continued RLE weakness but gradually improving. Does report chronic mild hip pain but worsened post stroke. Does not use AD for ambulation - denies any recent falls. Does experience fatigue quickly but has been gradually improving. Currently working with PT. Lives in own home but sister has been staying with her. Has been gradually returning back to do house work and cooking but tries not to over due it.  Completed DAPT currently on aspirin and atorvastatin without side effects. Blood pressure today 140/68. Occasionally monitors at home and has been stable.  No further concerns at this time     PERTINENT IMAGING/LABS  Per recent hospitalization CT no acute finding CTA head and neck unremarkable. MRI  small punctate infarct at left PLIC 2D Echo  EF  123456 LDL 105 HgbA1c 5.1    ROS:   14 system review of systems performed and negative with exception of fatigue, weakness, joint pain  PMH:  Past Medical History:  Diagnosis Date   Allergy    seasonal allergies   Asthma    uses inhaler    Colon polyps    GERD (gastroesophageal reflux disease)    with certain foods   Hyperlipidemia    on meds   Hypertension    on meds   Menopausal state    Osteopenia    Rhinitis    Thyroid disease    on meds   Vitamin D deficiency disease     PSH:  Past Surgical History:  Procedure Laterality Date   CHOLECYSTECTOMY  2005   COLONOSCOPY  2099   CG-F/V-miralax(exc)-TA   TONSILLECTOMY      Social History:  Social History   Socioeconomic History   Marital status: Single    Spouse name: Not on file   Number of children: 0   Years of education: 12   Highest education level: 12th grade  Occupational History   Occupation: retired    Fish farm manager: [employer]    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 Never married and no children. Retired from [employer] in the [employer].    Social Determinants of Health   Financial Resource Strain: Not on file  Food Insecurity: Not on file  Transportation Needs: Not on file  Physical Activity: Not on file  Stress: Not on file  Social Connections: Not on file  Intimate Partner Violence: Not on file    Family History:  Family History  Problem Relation Age of Onset   Hyperlipidemia Mother    Hypertension Mother    Thyroid disease Mother    Osteoporosis Mother    Colon polyps Mother    Colon cancer Mother 83   AAA (abdominal aortic aneurysm) Mother    Heart disease Father     Heart attack Father        heavy smoker   Hypertension Sister    Gout Sister    Hypertension Brother    Asthma Brother    Esophageal cancer Neg Hx    Stomach cancer Neg Hx     Medications:   Current Outpatient Medications on File Prior to Visit  Medication Sig Dispense Refill   albuterol (VENTOLIN HFA) 108 (90 Base) MCG/ACT inhaler Inhale 2 puffs into the lungs every 6 (six) hours as needed for wheezing. 18 g 11   alendronate (FOSAMAX) 70 MG tablet Take 1 tablet (70 mg total) by mouth every 7 (seven) days. Take with a full glass of water on an empty stomach. 13 tablet 3   amLODipine (NORVASC) 5 MG tablet Take 1 tablet (5 mg total) by mouth daily. 90 tablet 3   aspirin 81 MG EC tablet Take 1 tablet (81 mg total) by mouth daily. Swallow whole. 60 tablet 0   atorvastatin (LIPITOR) 40 MG tablet Take 1 tablet (40 mg total) by mouth daily. 90 tablet 3   Cholecalciferol (VITAMIN D3) 3000 UNITS TABS Take 2,000 Units by mouth daily.     fluticasone (FLONASE) 50 MCG/ACT nasal spray Use 2 spray(s) in each nostril once daily (Patient taking differently: Place 2 sprays into both nostrils every evening. Use 2 spray(s) in each nostril once daily) 48 g 1   fluticasone-salmeterol (ADVAIR) 100-50 MCG/ACT AEPB Inhale 1 puff into the lungs 2 (two) times daily. 180 each 3   levothyroxine (EUTHYROX) 75 MCG tablet Take 1 tablet (75 mcg total) by mouth daily. 90 tablet 3   lisinopril (ZESTRIL) 20 MG tablet Take 1 tablet (20 mg total) by mouth daily. 90 tablet 3   Multiple Vitamin (MULTIVITAMIN WITH MINERALS) TABS Take 1 tablet by mouth daily.     pantoprazole (PROTONIX) 40 MG tablet Take 1 tablet (40 mg total) by mouth daily. For stomach 30 tablet 11   No current facility-administered medications on file prior to visit.    Allergies:   Allergies  Allergen Reactions   Evista [Raloxifene Hcl] Other (See Comments)    Bone pain, couldn't put weight on right leg/foot   Fosamax [Alendronate Sodium]     Bone  pain   Red Dye    Sulfa Antibiotics       OBJECTIVE:  Physical Exam  Vitals:   08/06/21 1432  BP: 140/68  Pulse: 88  Weight: 133 lb 6.4 oz (60.5 kg)  Height: 5\' 1"  (1.549 m)   Body mass index is 25.21 kg/m. No results found.  Post stroke PHQ 2/9 Depression screen PHQ 2/9 06/27/2021  Decreased Interest 0  Down, Depressed, Hopeless 0  PHQ -  2 Score 0     General: well developed, well nourished, very pleasant elderly Caucasian female seated, in no evident distress Head: head normocephalic and atraumatic.   Neck: supple with no carotid or supraclavicular bruits Cardiovascular: regular rate and rhythm, no murmurs Musculoskeletal: no deformity Skin:  no rash/petichiae Vascular:  Normal pulses all extremities   Neurologic Exam Mental Status: Awake and fully alert.  Fluent speech and language.  Oriented to place and time. Recent and remote memory intact. Attention span, concentration and fund of knowledge appropriate. Mood and affect appropriate.  Cranial Nerves: Fundoscopic exam reveals sharp disc margins. Pupils equal, briskly reactive to light. Extraocular movements full without nystagmus. Visual fields full to confrontation. Hearing intact. Facial sensation intact. Face, tongue, palate moves normally and symmetrically.  Motor: Normal bulk and tone. Normal strength in all tested extremity muscles mild RLE hip flexor weakness Sensory.: intact to touch , pinprick , position and vibratory sensation.  Coordination: Rapid alternating movements normal in all extremities. Finger-to-nose and heel-to-shin performed accurately bilaterally. Gait and Station: Arises from chair without difficulty. Stance is normal. Gait demonstrates normal stride length and slightly decreased RLE step height without use of AD. Unable to perform tandem walk and heel toe  Reflexes: 1+ and symmetric. Toes downgoing.     NIHSS  0 Modified Rankin  2      ASSESSMENT: [name] is a 75 y.o. year old  female with left PLIC small punctate infarct on 06/22/2021 likely secondary to small vessel disease source. Vascular risk factors include HTN, HLD and advanced age.      PLAN:  L PLIC stroke :  Residual deficit: mild RLE weakness and fatigue - gradually improving.  Continue participation with PT for hopeful ongoing recovery  continue aspirin 81 mg daily  and atorvastatin 40 mg daily for secondary stroke prevention.   Discussed secondary stroke prevention measures and importance of close PCP follow up for aggressive stroke risk factor management. I have gone over the pathophysiology of stroke, warning signs and symptoms, risk factors and their management in some detail with instructions to go to the closest emergency room for symptoms of concern. HTN: BP goal <130/90.  Stable on current regimen per PCP HLD: LDL goal <70. Recent LDL 105.  Continue atorvastatin 40 mg daily - plans on repeat lipid panel with PCP in 09/2021    Follow up in 6 months or call earlier if needed   CC:  [place] provider: Dr. Leonie Man PCP: [name], MD    I spent 54 minutes of face-to-face and non-face-to-face time with patient.  This included previsit chart review including review of recent hospitalization, lab review, study review, electronic health record documentation, patient education regarding recent stroke including etiology, secondary stroke prevention measures and importance of managing stroke risk factors, residual deficits and typical recovery time and answered all other questions to patient satisfaction   Frann Rider, AGNP-BC  [facility] [address] [place], [place] [address]  Phone [phone] Fax [phone] Note: This document was prepared with digital dictation and possible smart phrase technology. Any transcriptional errors that result from this process are unintentional.

## 2021-08-05 NOTE — Therapy (Signed)
[facility] [address] [place], [place], [address] Phone: [phone]   Fax:  [phone]  Physical Therapy Treatment  Patient Details  Name: [name] MRN: [id number] Date of Birth: 03-22-1946 Referring Provider (PT): Nelson Chimes   Encounter Date: 08/05/2021   PT End of Session - 08/05/21 0948     Visit Number 9    Number of Visits 12    Date for PT Re-Evaluation 08/30/21    PT Start Time 0945    PT Stop Time 1027    PT Time Calculation (min) 42 min    Activity Tolerance Patient tolerated treatment well    Behavior During Therapy [facility] for tasks assessed/performed             Past Medical History:  Diagnosis Date   Allergy    seasonal allergies   Asthma    uses inhaler    Colon polyps    GERD (gastroesophageal reflux disease)    with certain foods   Hyperlipidemia    on meds   Hypertension    on meds   Menopausal state    Osteopenia    Rhinitis    Thyroid disease    on meds   Vitamin D deficiency disease     Past Surgical History:  Procedure Laterality Date   CHOLECYSTECTOMY  2005   COLONOSCOPY  2099   CG-F/V-miralax(exc)-TA   TONSILLECTOMY      There were no vitals filed for this visit.   Subjective Assessment - 08/05/21 0947     Subjective Patient reports that she feels ok today and has not had any problems since her last appointment.    Pertinent History osteoporosis    Limitations Walking    How long can you walk comfortably? about 20 feet    Patient Stated Goals be independent, get stronger, drive her car (MD told her to wait 1 month prior to driving)    Currently in Pain? No/denies                               [facility] Adult PT Treatment/Exercise - 08/05/21 0001       Knee/Hip Exercises: Aerobic   Nustep L5 x 15 minutes      Knee/Hip Exercises: Seated   Long Arc Quad Strengthening;Both;2 sets;20 reps;Weights    Long Arc Quad Weight 5 lbs.    Sit to Sand without UE  support;10 reps                 Balance Exercises - 08/05/21 0001       Balance Exercises: Standing   Rockerboard Anterior/posterior;EO;Lateral;UE support   2 minutes each   Balance Beam 1 HHA; on foam beam   3 minutes   Sidestepping Foam/compliant support   3 minutes; intermittent UE support   Marching Foam/compliant surface;Static   2 fingertip support; 3 minutes                    PT Long Term Goals - 07/22/21 1032       PT LONG TERM GOAL #1   Title Patient will be independent with her HEP.    Time 6    Period Weeks    Status On-going      PT LONG TERM GOAL #2   Title Patient will be able to walk at least 15 minutes for improved function shopping.    Baseline less than 5 minutes;  hasn't tried yet 11/28    Time 6    Period Weeks    Status On-going      PT LONG TERM GOAL #3   Title Patient will be able to transfer from sitting to standing without upper extremity assistance.    Baseline 11/28 use UE intermittently    Time 6    Period Weeks    Status On-going      PT LONG TERM GOAL #4   Title Patient will be able to stand for at least 20 minutes for improved function cooking.    Baseline less than 5 minutes; 20 mins 11/28    Time 6    Period Weeks    Status Achieved                   Plan - 08/05/21 0948     Clinical Impression Statement Treatment focused on new and familiar interventions for improved lower extremity stability needed for improved safety with her daily activities. She required minimal cuing with today's balance interventions for reduced reliance on her upper extremities for increased demand on her lower extremities. Fatigue was her primary limitation with today's interventions as she required infrequent rest breaks throughout treatment. She reported feeling tired upon the conclusion of treatment. She continues to require skilled physical therapy to address her remaining impairments to return to her prior level of function.     Personal Factors and Comorbidities Comorbidity 1;Comorbidity 2;Transportation    Comorbidities history of TIA, osteoporosis, HTN, asthma    Examination-Activity Limitations Locomotion Level;Transfers;Carry;Stand    Examination-Participation Restrictions Other;Community Activity    Stability/Clinical Decision Making Evolving/Moderate complexity    Rehab Potential Good    PT Frequency 2x / week    PT Duration 6 weeks    PT Treatment/Interventions Functional mobility training;Therapeutic activities;Therapeutic exercise;Balance training;Neuromuscular re-education;Patient/family education;Energy conservation    PT Next Visit Plan recumbent bike, lower extremity strengthening    Consulted and Agree with Plan of Care Patient             Patient will benefit from skilled therapeutic intervention in order to improve the following deficits and impairments:  Difficulty walking, Decreased endurance, Pain, Decreased activity tolerance, Decreased balance, Decreased strength  Visit Diagnosis: Muscle weakness (generalized)     Problem List Patient Active Problem List   Diagnosis Date Noted   TIA (transient ischemic attack) 06/22/2021   Asthma, chronic, moderate persistent, uncomplicated 01/24/2020   Essential hypertension 05/18/2019   Abnormal EKG 05/18/2019   Osteopenia 03/11/2013   Hyperlipidemia 03/11/2013   Hypothyroidism 11/22/2012   Asthma, chronic 11/22/2012    Granville Lewis, PT 08/05/2021, 11:24 AM  [facility] [address] [place], [place], [address] Phone: [phone]   Fax:  [phone]  Name: [name] MRN: [id number] Date of Birth: 01/16/1946

## 2021-08-06 ENCOUNTER — Encounter: Payer: Self-pay | Admitting: Adult Health

## 2021-08-06 ENCOUNTER — Ambulatory Visit (INDEPENDENT_AMBULATORY_CARE_PROVIDER_SITE_OTHER): Payer: Medicare Other | Admitting: Adult Health

## 2021-08-06 VITALS — BP 140/68 | HR 88 | Ht 61.0 in | Wt 133.4 lb

## 2021-08-06 DIAGNOSIS — I10 Essential (primary) hypertension: Secondary | ICD-10-CM | POA: Diagnosis not present

## 2021-08-06 DIAGNOSIS — E785 Hyperlipidemia, unspecified: Secondary | ICD-10-CM | POA: Diagnosis not present

## 2021-08-06 DIAGNOSIS — I639 Cerebral infarction, unspecified: Secondary | ICD-10-CM

## 2021-08-06 NOTE — Patient Instructions (Signed)
Continue to work with therapies for hopeful going recovery  Continue aspirin 81 mg daily  and atorvastatin 40mg  daily  for secondary stroke prevention  Continue to follow up with PCP regarding cholesterol and blood pressure management  Maintain strict control of hypertension with blood pressure goal below 130/90 and cholesterol with LDL cholesterol (bad cholesterol) goal below 70 mg/dL.   Signs of a Stroke? Follow the BEFAST method:  Balance Watch for a sudden loss of balance, trouble with coordination or vertigo Eyes Is there a sudden loss of vision in one or both eyes? Or double vision?  Face: Ask the person to smile. Does one side of the face droop or is it numb?  Arms: Ask the person to raise both arms. Does one arm drift downward? Is there weakness or numbness of a leg? Speech: Ask the person to repeat a simple phrase. Does the speech sound slurred/strange? Is the person confused ? Time: If you observe any of these signs, call 911.     Followup in the future with me in 6 months or call earlier if needed       Thank you for coming to see at [facility]. I hope we have been able to provide you high quality care today.  You may receive a patient satisfaction survey over the next few weeks. We would appreciate your feedback and comments so that we may continue to improve ourselves and the health of our patients.   Stroke Prevention Some medical conditions and lifestyle choices can lead to a higher risk for a stroke. You can help to prevent a stroke by eating healthy foods and exercising. It also helps to not smoke and to manage any health problems you may have. How can this condition affect me? A stroke is an emergency. It should be treated right away. A stroke can lead to brain damage or threaten your life. There is a better chance of surviving and getting better after a stroke if you get medical help right away. What can increase my risk? The following  medical conditions may increase your risk of a stroke: Diseases of the heart and blood vessels (cardiovascular disease). High blood pressure (hypertension). Diabetes. High cholesterol. Sickle cell disease. Problems with blood clotting. Being very overweight. Sleeping problems (obstructivesleep apnea). Other risk factors include: Being older than age 51. A history of blood clots, stroke, or mini-stroke (TIA). Race, ethnic background, or a family history of stroke. Smoking or using tobacco products. Taking birth control pills, especially if you smoke. Heavy alcohol and drug use. Not being active. What actions can I take to prevent this? Manage your health conditions High cholesterol. Eat a healthy diet. If this is not enough to manage your cholesterol, you may need to take medicines. Take medicines as told by your doctor. High blood pressure. Try to keep your blood pressure below 130/80. If your blood pressure cannot be managed through a healthy diet and regular exercise, you may need to take medicines. Take medicines as told by your doctor. Ask your doctor if you should check your blood pressure at home. Have your blood pressure checked every year. Diabetes. Eat a healthy diet and get regular exercise. If your blood sugar (glucose) cannot be managed through diet and exercise, you may need to take medicines. Take medicines as told by your doctor. Talk to your doctor about getting checked for sleeping problems. Signs of a problem can include: Snoring a lot. Feeling very tired. Make sure that you manage  any other conditions you have. Nutrition  Follow instructions from your doctor about what to eat or drink. You may be told to: Eat and drink fewer calories each day. Limit how much salt (sodium) you use to 1,500 milligrams (mg) each day. Use only healthy fats for cooking, such as olive oil, canola oil, and sunflower oil. Eat healthy foods. To do this: Choose foods that are high  in fiber. These include whole grains, and fresh fruits and vegetables. Eat at least 5 servings of fruits and vegetables a day. Try to fill one-half of your plate with fruits and vegetables at each meal. Choose low-fat (lean) proteins. These include low-fat cuts of meat, chicken without skin, fish, tofu, beans, and nuts. Eat low-fat dairy products. Avoid foods that: Are high in salt. Have saturated fat. Have trans fat. Have cholesterol. Are processed or pre-made. Count how many carbohydrates you eat and drink each day. Lifestyle If you drink alcohol: Limit how much you have to: 0-1 drink a day for women who are not pregnant. 0-2 drinks a day for men. Know how much alcohol is in your drink. In the [place] one drink equals one 12 oz bottle of beer ( ), one 5 oz glass of wine ( ), or one 1 oz glass of hard liquor (32mL). Do not smoke or use any products that have nicotine or tobacco. If you need help quitting, ask your doctor. Avoid secondhand smoke. Do not use drugs. Activity  Try to stay at a healthy weight. Get at least 30 minutes of exercise on most days, such as: Fast walking. Biking. Swimming. Medicines Take over-the-counter and prescription medicines only as told by your doctor. Avoid taking birth control pills. Talk to your doctor about the risks of taking birth control pills if: You are over 52 years old. You smoke. You get very bad headaches. You have had a blood clot. Where to find more information American Stroke Association: www.strokeassociation.org Get help right away if: You or a loved one has any signs of a stroke. "BE FAST" is an easy way to remember the warning signs: B - Balance. Dizziness, sudden trouble walking, or loss of balance. E - Eyes. Trouble seeing or a change in how you see. F - Face. Sudden weakness or loss of feeling of the face. The face or eyelid may droop on one side. A - Arms. Weakness or loss of feeling in an arm. This happens all of a  sudden and most often on one side of the body. S - Speech. Sudden trouble speaking, slurred speech, or trouble understanding what people say. T - Time. Time to call emergency services. Write down what time symptoms started. You or a loved one has other signs of a stroke, such as: A sudden, very bad headache with no known cause. Feeling like you may vomit (nausea). Vomiting. A seizure. These symptoms may be an emergency. Get help right away. Call your local emergency services (911 in the [place] Do not wait to see if the symptoms will go away. Do not drive yourself to the hospital. Summary You can help to prevent a stroke by eating healthy, exercising, and not smoking. It also helps to manage any health problems you have. Do not smoke or use any products that contain nicotine or tobacco. Get help right away if you or a loved one has any signs of a stroke. This information is not intended to replace advice given to you by your health care provider. Make sure you discuss any questions  you have with your health care provider. Document Revised: 03/12/2020 Document Reviewed: 03/12/2020 Elsevier Patient Education  2022 [employer].

## 2021-08-07 ENCOUNTER — Other Ambulatory Visit: Payer: Self-pay

## 2021-08-07 ENCOUNTER — Encounter: Payer: Self-pay | Admitting: Physical Therapy

## 2021-08-07 ENCOUNTER — Ambulatory Visit: Payer: Medicare Other | Admitting: Physical Therapy

## 2021-08-07 DIAGNOSIS — M6281 Muscle weakness (generalized): Secondary | ICD-10-CM | POA: Diagnosis not present

## 2021-08-07 NOTE — Progress Notes (Signed)
I agree with the above plan 

## 2021-08-07 NOTE — Therapy (Addendum)
[facility] [address] [place], [place], [address] Phone: [phone]   Fax:  [phone]  Physical Therapy Treatment  Patient Details  Name: DEKLYN TRACHTENBERG MRN: [id number] Date of Birth: Dec 30, 1945 Referring Provider (PT): Nelson Chimes   Encounter Date: 08/07/2021   PT End of Session - 08/07/21 1044     Visit Number 10    Number of Visits 12    Date for PT Re-Evaluation 08/30/21    PT Start Time 1032    PT Stop Time 1113    PT Time Calculation (min) 41 min    Activity Tolerance Patient tolerated treatment well    Behavior During Therapy [facility] for tasks assessed/performed             Past Medical History:  Diagnosis Date   Allergy    seasonal allergies   Asthma    uses inhaler    Colon polyps    GERD (gastroesophageal reflux disease)    with certain foods   Hyperlipidemia    on meds   Hypertension    on meds   Menopausal state    Osteopenia    Rhinitis    Thyroid disease    on meds   Vitamin D deficiency disease     Past Surgical History:  Procedure Laterality Date   CHOLECYSTECTOMY  2005   COLONOSCOPY  2099   CG-F/V-miralax(exc)-TA   TONSILLECTOMY      There were no vitals filed for this visit.   Subjective Assessment - 08/07/21 1043     Subjective Patient reports that she feels ok today and has not had any problems since her last appointment.    Pertinent History osteoporosis    Limitations Walking    How long can you walk comfortably? about 20 feet    Patient Stated Goals be independent, get stronger, drive her car (MD told her to wait 1 month prior to driving)    Currently in Pain? No/denies                [facility] PT Assessment - 08/07/21 0001       Assessment   Medical Diagnosis History of TIA    Referring Provider (PT) Nelson Chimes    Onset Date/Surgical Date 06/22/21    Hand Dominance Right    Next MD Visit 09/26/21    Prior Therapy No      Precautions   Precautions None                            [facility] Adult PT Treatment/Exercise - 08/07/21 0001       Knee/Hip Exercises: Aerobic   Nustep L4 x 19 minutes                 Balance Exercises - 08/07/21 0001       Balance Exercises: Standing   Standing Eyes Opened Narrow base of support (BOS);Head turns;Foam/compliant surface   and trunk rotation   Standing Eyes Closed Narrow base of support (BOS);Foam/compliant surface    Standing, One Foot on a Step Eyes open;Trunk rotation;Head turns;6 inch    Step Ups Forward;6 inch;UE support 1    [facility] Rotation Foam/compliant surface;R/L    Marching Foam/compliant surface;Dynamic;15 reps    Lift / Chop Both;15 reps;Limitations    Lift / Chop Limitations airex    Other Standing Exercises mini squat on airex x10 reps    Other Standing Exercises Comments B D2 on airex  PT Long Term Goals - 07/22/21 1032       PT LONG TERM GOAL #1   Title Patient will be independent with her HEP.    Time 6    Period Weeks    Status On-going      PT LONG TERM GOAL #2   Title Patient will be able to walk at least 15 minutes for improved function shopping.    Baseline less than 5 minutes; hasn't tried yet 11/28    Time 6    Period Weeks    Status On-going      PT LONG TERM GOAL #3   Title Patient will be able to transfer from sitting to standing without upper extremity assistance.    Baseline 11/28 use UE intermittently    Time 6    Period Weeks    Status On-going      PT LONG TERM GOAL #4   Title Patient will be able to stand for at least 20 minutes for improved function cooking.    Baseline less than 5 minutes; 20 mins 11/28    Time 6    Period Weeks    Status Achieved                   Plan - 08/07/21 1135     Clinical Impression Statement Patient presented in clinic with only complaints being of instability with making turns. Patient does admit to possibly walking too fast with turns and was cautioned to take  her time. Patient progressed to more dyanmic balance activities with limited UE support along with head and trunk turns. Patient fatigued by end of treatment. Patient denies any yard work.    Personal Factors and Comorbidities Comorbidity 1;Comorbidity 2;Transportation    Comorbidities history of TIA, osteoporosis, HTN, asthma    Examination-Activity Limitations Locomotion Level;Transfers;Carry;Stand    Examination-Participation Restrictions Other;Community Activity    Stability/Clinical Decision Making Evolving/Moderate complexity    Rehab Potential Good    PT Frequency 2x / week    PT Duration 6 weeks    PT Treatment/Interventions Functional mobility training;Therapeutic activities;Therapeutic exercise;Balance training;Neuromuscular re-education;Patient/family education;Energy conservation    PT Next Visit Plan recumbent bike, lower extremity strengthening    PT Home Exercise Plan Access Code: [email or web address]  URL: [email or web address]  Date: 07/03/2021  Prepared by: Candi Leash    Exercises  Towel Roll Squeeze - 2 x daily - 7 x weekly - 3 sets - 10 reps  Seated Long Arc Quad - 2 x daily - 7 x weekly - 3 sets - 10 reps  Seated March - 2 x daily - 7 x weekly - 3 sets - 10 reps    Consulted and Agree with Plan of Care Patient             Patient will benefit from skilled therapeutic intervention in order to improve the following deficits and impairments:  Difficulty walking, Decreased endurance, Pain, Decreased activity tolerance, Decreased balance, Decreased strength  Visit Diagnosis: Muscle weakness (generalized)     Problem List Patient Active Problem List   Diagnosis Date Noted   TIA (transient ischemic attack) 06/22/2021   Asthma, chronic, moderate persistent, uncomplicated 01/24/2020   Essential hypertension 05/18/2019   Abnormal EKG 05/18/2019   Osteopenia 03/11/2013   Hyperlipidemia 03/11/2013   Hypothyroidism 11/22/2012   Asthma, chronic 11/22/2012     Marvell Fuller, PTA 08/07/2021, 11:45 AM  [facility] [address] [place], [place], [address] Phone: [phone]  Fax:  [phone]  Name: ANVI MANGAL MRN: [id number] Date of Birth: 13-May-1946  Progress Note Reporting Period 07/03/21 to 08/07/21  See note below for Objective Data and Assessment of Progress/Goals.    Patient is making good progress toward her goals for physical therapy. Recommend that she continue with her current plan of care to safely return to her prior level of function.   Candi Leash, PT, DPT

## 2021-08-12 ENCOUNTER — Ambulatory Visit: Payer: Medicare Other | Admitting: *Deleted

## 2021-08-12 ENCOUNTER — Other Ambulatory Visit: Payer: Self-pay

## 2021-08-12 DIAGNOSIS — M6281 Muscle weakness (generalized): Secondary | ICD-10-CM

## 2021-08-12 NOTE — Therapy (Signed)
[facility] [address] [place], [place], [address] Phone: [phone]   Fax:  [phone]  Physical Therapy Treatment  Patient Details  Name: [name] MRN: [id number] Date of Birth: February 03, 1946 Referring Provider (PT): Nelson Chimes   Encounter Date: 08/12/2021   PT End of Session - 08/12/21 1041     Visit Number 11    Number of Visits 12    Date for PT Re-Evaluation 08/30/21    PT Start Time 1030    PT Stop Time 1120    PT Time Calculation (min) 50 min             Past Medical History:  Diagnosis Date   Allergy    seasonal allergies   Asthma    uses inhaler    Colon polyps    GERD (gastroesophageal reflux disease)    with certain foods   Hyperlipidemia    on meds   Hypertension    on meds   Menopausal state    Osteopenia    Rhinitis    Thyroid disease    on meds   Vitamin D deficiency disease     Past Surgical History:  Procedure Laterality Date   CHOLECYSTECTOMY  2005   COLONOSCOPY  2099   CG-F/V-miralax(exc)-TA   TONSILLECTOMY      There were no vitals filed for this visit.   Subjective Assessment - 08/12/21 1031     Subjective Patient reports that she feels ok today and has not had any problems since her last appointment.Doing okay today    Pertinent History osteoporosis    Limitations Walking    How long can you walk comfortably? about 20 feet    Patient Stated Goals be independent, get stronger, drive her car (MD told her to wait 1 month prior to driving)    Currently in Pain? No/denies                               [facility] Adult PT Treatment/Exercise - 08/12/21 0001       Knee/Hip Exercises: Aerobic   Nustep L5 x 15 minutes      Knee/Hip Exercises: Seated   Long Arc Quad Strengthening;Both;3 sets;10 reps    Long Arc Quad Weight 5 lbs.                 Balance Exercises - 08/12/21 0001       Balance Exercises: Standing   Standing Eyes Opened Narrow base of  support (BOS);Foam/compliant surface;Cognitive challenge   reaching and pointing at called out numbers on wall   Standing Eyes Closed Narrow base of support (BOS);Foam/compliant surface    Tandem Stance Eyes open;Foam/compliant surface;Intermittent upper extremity support;Time    SLS 2 reps;30 secs    Standing, One Foot on a Step Eyes open;Trunk rotation;Head turns;6 inch    Rockerboard Anterior/posterior;EO;Lateral;UE support   2 minutes each   Step Ups Forward;6 inch;UE support 1    Balance Beam 1 HHA; on foam beam side stepping x 3 mins   3 minutes   Marching Foam/compliant surface;Dynamic;15 reps                     PT Long Term Goals - 07/22/21 1032       PT LONG TERM GOAL #1   Title Patient will be independent with her HEP.    Time 6    Period Weeks  Status On-going      PT LONG TERM GOAL #2   Title Patient will be able to walk at least 15 minutes for improved function shopping.    Baseline less than 5 minutes; hasn't tried yet 11/28    Time 6    Period Weeks    Status On-going      PT LONG TERM GOAL #3   Title Patient will be able to transfer from sitting to standing without upper extremity assistance.    Baseline 11/28 use UE intermittently    Time 6    Period Weeks    Status On-going      PT LONG TERM GOAL #4   Title Patient will be able to stand for at least 20 minutes for improved function cooking.    Baseline less than 5 minutes; 20 mins 11/28    Time 6    Period Weeks    Status Achieved                   Plan - 08/12/21 1327     Clinical Impression Statement Pt arrived today doing very well and was able to perform all therex and balance act's without complaint. Side stepping and tandem stance on balance beam was the most challenging. DC after next visit    Personal Factors and Comorbidities Comorbidity 1;Comorbidity 2;Transportation    Comorbidities history of TIA, osteoporosis, HTN, asthma    Examination-Activity Limitations  Locomotion Level;Transfers;Carry;Stand    Examination-Participation Restrictions Other;Community Activity    Stability/Clinical Decision Making Evolving/Moderate complexity    Rehab Potential Good    PT Frequency 2x / week    PT Duration 6 weeks    PT Treatment/Interventions Functional mobility training;Therapeutic activities;Therapeutic exercise;Balance training;Neuromuscular re-education;Patient/family education;Energy conservation    PT Next Visit Plan DC after next visit             Patient will benefit from skilled therapeutic intervention in order to improve the following deficits and impairments:  Difficulty walking, Decreased endurance, Pain, Decreased activity tolerance, Decreased balance, Decreased strength  Visit Diagnosis: Muscle weakness (generalized)     Problem List Patient Active Problem List   Diagnosis Date Noted   TIA (transient ischemic attack) 06/22/2021   Asthma, chronic, moderate persistent, uncomplicated 01/24/2020   Essential hypertension 05/18/2019   Abnormal EKG 05/18/2019   Osteopenia 03/11/2013   Hyperlipidemia 03/11/2013   Hypothyroidism 11/22/2012   Asthma, chronic 11/22/2012    Jencarlos Nicolson,CHRIS, PTA 08/12/2021, 1:33 PM  [facility] [address] [place], [place], [address] Phone: [phone]   Fax:  [phone]  Name: [name] MRN: [id number] Date of Birth: 09-22-1945

## 2021-08-14 ENCOUNTER — Other Ambulatory Visit: Payer: Self-pay

## 2021-08-14 ENCOUNTER — Ambulatory Visit: Payer: Medicare Other

## 2021-08-14 DIAGNOSIS — M6281 Muscle weakness (generalized): Secondary | ICD-10-CM | POA: Diagnosis not present

## 2021-08-14 NOTE — Therapy (Addendum)
[facility] [place], [place], [address] Phone: [phone]   Fax:  [phone]  Physical Therapy Treatment  Patient Details  Name: [name] MRN: [id number] Date of Birth: 04/12/1946 Referring Provider (PT): Reesa Chew   Encounter Date: 08/14/2021   PT End of Session - 08/14/21 1030     Visit Number 12    Number of Visits 12    Date for PT Re-Evaluation 08/30/21    PT Start Time 1030    PT Stop Time 1112    PT Time Calculation (min) 42 min             Past Medical History:  Diagnosis Date   Allergy    seasonal allergies   Asthma    uses inhaler    Colon polyps    GERD (gastroesophageal reflux disease)    with certain foods   Hyperlipidemia    on meds   Hypertension    on meds   Menopausal state    Osteopenia    Rhinitis    Thyroid disease    on meds   Vitamin D deficiency disease     Past Surgical History:  Procedure Laterality Date   CHOLECYSTECTOMY  2005   COLONOSCOPY  2099   CG-F/V-miralax(exc)-TA   TONSILLECTOMY      There were no vitals filed for this visit.   Subjective Assessment - 08/14/21 1030     Pertinent History osteoporosis    Limitations Walking    How long can you walk comfortably? about 20 feet    Patient Stated Goals be independent, get stronger, drive her car (MD told her to wait 1 month prior to driving)                               [facility] Adult PT Treatment/Exercise - 08/14/21 0001       Knee/Hip Exercises: Aerobic   Nustep Lvl 5 x 15 mins      Knee/Hip Exercises: Seated   Long Arc Quad Strengthening;Both;3 sets;10 reps    Long Arc Quad Weight 5 lbs.                 Balance Exercises - 08/14/21 0001       Balance Exercises: Standing   Rockerboard Anterior/posterior;EO   3 mins   Step Ups Forward;6 inch;UE support 1   BLE 20 reps   Tandem Gait Forward;Upper extremity support   single UE support, 36f x 6   Sidestepping  Foam/compliant support   3 mins intermittent UE support   Marching Foam/compliant surface;Upper extremity assist 1;Static   2 mins   Heel Raises Both;20 reps   Airex, 1 UE support   Toe Raise Both;20 reps   Airex, 1 UE support   Sit to Stand Foam/compliant surface;Without upper extremity support   15 reps   Other Standing Exercises mini squat on airex x 20 reps                     PT Long Term Goals - 08/14/21 1032       PT LONG TERM GOAL #1   Title Patient will be independent with her HEP.    Time 6    Period Weeks    Status Achieved      PT LONG TERM GOAL #2   Title Patient will be able to walk at least 15 minutes for improved function shopping.  Baseline less than 5 minutes; hasn't tried yet 11/28; 08/14/21: has gone shopping    Time 6    Period Weeks    Status Achieved      PT LONG TERM GOAL #3   Title Patient will be able to transfer from sitting to standing without upper extremity assistance.    Baseline 11/28 use UE intermittently: 08/14/21: achieved    Time 6    Period Weeks    Status Achieved      PT LONG TERM GOAL #4   Title Patient will be able to stand for at least 20 minutes for improved function cooking.    Baseline less than 5 minutes; 20 mins 11/28    Time 6    Period Weeks    Status Achieved                   Plan - 08/14/21 1031     Clinical Impression Statement Pt arrives for today's treatment session denying any pain.  Pt states that she would like to concentrate on balance activities today prior to discharge.  Pt able to perform all balance activities with no UE support or intermittent single UE support.  Pt challenged by sit to stands on Airex pad, but able to complete all reps asked of her without use of UE support.  Pt has met all of her goals set for her at this time.  Pt denies any pain at completion of today's treatment session.  Pt feels that she is ready for discharge at this time.    Personal Factors and Comorbidities  Comorbidity 1;Comorbidity 2;Transportation    Comorbidities history of TIA, osteoporosis, HTN, asthma    Examination-Activity Limitations Locomotion Level;Transfers;Carry;Stand    Examination-Participation Restrictions Other;Community Activity    Stability/Clinical Decision Making Evolving/Moderate complexity    Rehab Potential Good    PT Frequency 2x / week    PT Duration 6 weeks    PT Treatment/Interventions Functional mobility training;Therapeutic activities;Therapeutic exercise;Balance training;Neuromuscular re-education;Patient/family education;Energy conservation    PT Next Visit Plan DC after next visit             Patient will benefit from skilled therapeutic intervention in order to improve the following deficits and impairments:  Difficulty walking, Decreased endurance, Pain, Decreased activity tolerance, Decreased balance, Decreased strength  Visit Diagnosis: Muscle weakness (generalized)     Problem List Patient Active Problem List   Diagnosis Date Noted   TIA (transient ischemic attack) 06/22/2021   Asthma, chronic, moderate persistent, uncomplicated 54/00/8676   Essential hypertension 05/18/2019   Abnormal EKG 05/18/2019   Osteopenia 03/11/2013   Hyperlipidemia 03/11/2013   Hypothyroidism 11/22/2012   Asthma, chronic 11/22/2012    Kathrynn Ducking, PTA 08/14/2021, 11:11 AM  [facility] [place], [place], [address] Phone: [phone]   Fax:  [phone]  Name: [name] MRN: [id number] Date of Birth: [birth year]  PHYSICAL THERAPY DISCHARGE SUMMARY  Visits from Start of Care: 12  Current functional level related to goals / functional outcomes: Patient is being discharged at this time as she has met all of her goals for physical therapy and she feels comfortable being discharged at this time.    Remaining deficits: None   Education / Equipment: HEP   Patient agrees to discharge.  Patient goals were met. Patient is being discharged due to meeting the stated rehab goals.

## 2021-09-02 ENCOUNTER — Ambulatory Visit (INDEPENDENT_AMBULATORY_CARE_PROVIDER_SITE_OTHER): Payer: Medicare Other

## 2021-09-02 VITALS — Ht 61.0 in | Wt 135.0 lb

## 2021-09-02 DIAGNOSIS — Z0000 Encounter for general adult medical examination without abnormal findings: Secondary | ICD-10-CM | POA: Diagnosis not present

## 2021-09-02 NOTE — Progress Notes (Signed)
Subjective:   [name] is a 76 y.o. female who presents for Medicare Annual (Subsequent) preventive examination.  Virtual Visit via Telephone Note  I connected with  [name] on 09/02/21 at  8:15 AM EST by telephone and verified that I am speaking with the correct person using two identifiers.  Location: Patient: Home Provider: [facility] Persons participating in the virtual visit: patient/Nurse Health Advisor   I discussed the limitations, risks, security and privacy concerns of performing an evaluation and management service by telephone and the availability of in person appointments. The patient expressed understanding and agreed to proceed.  Interactive audio and video telecommunications were attempted between this nurse and patient, however failed, due to patient having technical difficulties OR patient did not have access to video capability.  We continued and completed visit with audio only.  Some vital signs may be absent or patient reported.   [name] E Jennifer Negrette, LPN   Review of Systems     Cardiac Risk Factors include: advanced age (>77men, >85 women);dyslipidemia;hypertension;sedentary lifestyle;Other (see comment), Risk factor comments: hx of TIA     Objective:    Today's Vitals   09/02/21 0814  Weight: 135 lb (61.2 kg)  Height: 5\' 1"  (1.549 m)   Body mass index is 25.51 kg/m.  Advanced Directives 09/02/2021 07/03/2021 06/22/2021 08/31/2020 08/09/2020 03/10/2019 10/26/2017  Does Patient Have a Medical Advance Directive? No No No No No No No  Would patient like information on creating a medical advance directive? No - Patient declined - No - Patient declined No - Patient declined - Yes (MAU/Ambulatory/Procedural Areas - Information given) No - Patient declined    Current Medications (verified) Outpatient Encounter Medications as of 09/02/2021  Medication Sig   albuterol (VENTOLIN HFA) 108 (90 Base) MCG/ACT inhaler Inhale 2 puffs into the lungs every 6 (six) hours as  needed for wheezing.   alendronate (FOSAMAX) 70 MG tablet Take 1 tablet (70 mg total) by mouth every 7 (seven) days. Take with a full glass of water on an empty stomach.   amLODipine (NORVASC) 5 MG tablet Take 1 tablet (5 mg total) by mouth daily.   atorvastatin (LIPITOR) 40 MG tablet Take 1 tablet (40 mg total) by mouth daily.   Cholecalciferol (VITAMIN D3) 3000 UNITS TABS Take 2,000 Units by mouth daily.   fluticasone (FLONASE) 50 MCG/ACT nasal spray Use 2 spray(s) in each nostril once daily (Patient taking differently: Place 2 sprays into both nostrils every evening. Use 2 spray(s) in each nostril once daily)   fluticasone-salmeterol (ADVAIR) 100-50 MCG/ACT AEPB Inhale 1 puff into the lungs 2 (two) times daily.   levothyroxine (EUTHYROX) 75 MCG tablet Take 1 tablet (75 mcg total) by mouth daily.   lisinopril (ZESTRIL) 20 MG tablet Take 1 tablet (20 mg total) by mouth daily.   Multiple Vitamin (MULTIVITAMIN WITH MINERALS) TABS Take 1 tablet by mouth daily.   pantoprazole (PROTONIX) 40 MG tablet Take 1 tablet (40 mg total) by mouth daily. For stomach   No facility-administered encounter medications on file as of 09/02/2021.    Allergies (verified) Evista [raloxifene hcl], Fosamax [alendronate sodium], Red dye, and Sulfa antibiotics   History: Past Medical History:  Diagnosis Date   Allergy    seasonal allergies   Asthma    uses inhaler    Colon polyps    GERD (gastroesophageal reflux disease)    with certain foods   Hyperlipidemia    on meds   Hypertension    on meds  Menopausal state    Osteopenia    Rhinitis    Thyroid disease    on meds   Vitamin D deficiency disease    Past Surgical History:  Procedure Laterality Date   CHOLECYSTECTOMY  2005   COLONOSCOPY  2099   CG-F/V-miralax(exc)-TA   TONSILLECTOMY     Family History  Problem Relation Age of Onset   Hyperlipidemia Mother    Hypertension Mother    Thyroid disease Mother    Osteoporosis Mother    Colon polyps  Mother    Colon cancer Mother [id number]   AAA (abdominal aortic aneurysm) Mother    Heart disease Father    Heart attack Father        heavy smoker   Hypertension Sister    Gout Sister    Hypertension Brother    Asthma Brother    Esophageal cancer Neg Hx    Stomach cancer Neg Hx    Social History   Socioeconomic History   Marital status: Single    Spouse name: Not on file   Number of children: 0   Years of education: 12   Highest education level: 12th grade  Occupational History   Occupation: retired    Associate Professormployer: [employer]    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 Never married and no children. Retired from [employer] in the [employer]/Retirement department.    Social Determinants of Health   Financial Resource Strain: Low Risk    Difficulty of Paying Living Expenses: Not very hard  Food Insecurity: No Food Insecurity   Worried About Programme researcher, broadcasting/film/videounning Out of Food in the Last Year: Never true   Ran Out of Food in the Last Year: Never true  Transportation Needs: No Transportation Needs   Lack of Transportation (Medical): No   Lack of Transportation (Non-Medical): No  Physical Activity: Insufficiently Active   Days of Exercise per Week: 2 days   Minutes of Exercise per Session: 30 min  Stress: No Stress Concern Present   Feeling of Stress : Not at all  Social Connections: Socially Isolated   Frequency of Communication with Friends and Family: More than three times a week   Frequency of Social Gatherings with Friends and Family: More than three times a week   Attends Religious Services: Never   Database administratorActive Member of Clubs or Organizations: No   Attends Engineer, structuralClub or Organization Meetings: Never   Marital  Status: Never married    Tobacco Counseling Counseling given: Not Answered   Clinical Intake:  Pre-visit preparation completed: Yes  Pain : No/denies pain     BMI - recorded: 25.51 Nutritional Status: BMI 25 -29 Overweight Nutritional Risks: None Diabetes: No  How often do you need to have someone help you when you read instructions, pamphlets, or other written materials from your doctor or pharmacy?: 1 - Never  Diabetic? no  Interpreter Needed?: No  Information entered by :: Ricci Paff, LPN   Activities of Daily Living In your present state of health, do you have any difficulty performing the following activities: 09/02/2021 06/23/2021  Hearing? N N  Vision? N N  Difficulty concentrating or making decisions? Alpha GulaY N  Comment since stroke, takes longer to think of things -  Walking or climbing stairs? Y N  Comment avoids stairs, feels unstable -  Dressing or bathing? N N  Doing errands, shopping? N N  Preparing Food and eating ? N -  Using the Toilet? N -  In the past six months, have you accidently leaked urine? Y -  Comment mild - wears pads for protection -  Do you have problems with loss of bowel control? N -  Managing your Medications? N -  Managing your Finances? N -  Housekeeping or managing your Housekeeping? N -  Some recent data might be hidden    Patient Care Team: Mechele Claude, MD as PCP - General (Family Medicine) Laqueta Linden, MD (Inactive) as PCP - Cardiology (Cardiology) Ernesto Rutherford, MD as Consulting Physician (Ophthalmology) Iva Boop, MD as Consulting Physician (Gastroenterology)  Indicate any recent Medical Services you may have received from other than [facility] providers in the past year (date may be approximate).     Assessment:   This is a routine wellness examination for Judah.  Hearing/Vision screen Hearing Screening - Comments:: Denies hearing difficulties  Vision Screening - Comments:: Wears rx glasses - up to date  with annual eye exams with [facility]  Dietary issues and exercise activities discussed: Current Exercise Habits: Home exercise routine, Type of exercise: walking;stretching, Time (Minutes): 30, Frequency (Times/Week): 2, Weekly Exercise (Minutes/Week): 60, Intensity: Mild, Exercise limited by: neurologic condition(s)   Goals Addressed             This Visit's Progress    Exercise 3x per week (30 min per time)   On track    Walk for 30 minutes at least 3 times per week.        Depression Screen PHQ 2/9 Scores 09/02/2021 06/27/2021 03/27/2021 09/24/2020 08/31/2020 08/29/2020 03/12/2020  PHQ - 2 Score 0 0 0 0 0 0 0    Fall Risk Fall Risk  09/02/2021 06/27/2021 03/27/2021 09/24/2020 08/31/2020  Falls in the past year? 0 0 0 0 0  Number falls in past yr: 0 - - - -  Injury with Fall? 0 - - - -  Risk for fall due to : Other (Comment) - - No Fall Risks -  Risk for fall due to: Comment hx of TIA - - - -  Follow up Education provided;Falls prevention discussed - - Falls evaluation completed -    FALL RISK PREVENTION PERTAINING TO THE HOME:  Any stairs in or around the home? Yes  If so, are there any without handrails? No  Home free of loose throw rugs in walkways, pet beds, electrical cords, etc? Yes  Adequate lighting in your home to reduce risk of falls? Yes   ASSISTIVE DEVICES UTILIZED TO PREVENT FALLS:  Life alert? No  Use of a cane, walker or w/c? No  Grab bars in the bathroom? Yes  Shower chair or bench in shower? No  Elevated toilet seat or a handicapped toilet? Yes   TIMED UP AND GO:  Was the test performed? No . Telephonic visit  Cognitive Function: MMSE - Mini Mental State Exam 10/26/2017 07/02/2016 03/09/2015  Orientation to time 5 5 5   Orientation to Place 5 - 5  Registration 3 3 3   Attention/ Calculation 5 5 5   Recall 3 2 3   Language- name 2 objects 2 2 2   Language- repeat 1 1 1   Language- follow 3 step command 3 3 3   Language- read &  follow direction 1 1 1   Write a  sentence 1 1 1   Copy design 1 0 0  Total score 30 - 29     6CIT Screen 09/02/2021 08/31/2020 03/10/2019  What Year? 0 points 0 points 0 points  What month? 0 points 0 points 0 points  What time? 0 points 0 points 0 points  Count back from 20 0 points 0 points 0 points  Months in reverse 0 points 0 points 0 points  Repeat phrase 0 points 2 points 0 points  Total Score 0 2 0    Immunizations Immunization History  Administered Date(s) Administered   Fluad Quad(high Dose 65+) 06/03/2019, 05/22/2020, 05/30/2021   Influenza, High Dose Seasonal PF 06/16/2017, 06/04/2018   Influenza,inj,Quad PF,6+ Mos 06/10/2013, 06/05/2014, 06/05/2015, 05/26/2016   PFIZER(Purple Top)SARS-COV-2 Vaccination 11/30/2019, 12/23/2019, 07/23/2020   Pneumococcal Conjugate-13 12/26/2014   Pneumococcal Polysaccharide-23 11/22/2012   Td 09/30/2007   Tdap 12/23/2017   Zoster Recombinat (Shingrix) 03/27/2021    TDAP status: Up to date  Flu Vaccine status: Up to date  Pneumococcal vaccine status: Up to date  Covid-19 vaccine status: Completed vaccines  Qualifies for Shingles Vaccine? Yes   Zostavax completed Yes   Shingrix Completed?: No.    Education has been provided regarding the importance of this vaccine. Patient has been advised to call insurance company to determine out of pocket expense if they have not yet received this vaccine. Advised may also receive vaccine at local pharmacy or Health Dept. Verbalized acceptance and understanding.  Screening Tests Health Maintenance  Topic Date Due   COVID-19 Vaccine (4 - Booster for Pfizer series) 09/17/2020   Zoster Vaccines- Shingrix (2 of 2) 05/22/2021   MAMMOGRAM  11/21/2021   DEXA SCAN  05/21/2022   TETANUS/TDAP  12/24/2027   Pneumonia Vaccine 53+ Years old  Completed   INFLUENZA VACCINE  Completed   Hepatitis C Screening  Completed   HPV VACCINES  Aged Out   COLONOSCOPY (Pts 45-17yrs Insurance coverage will need to be confirmed)  Discontinued     Health Maintenance  Health Maintenance Due  Topic Date Due   COVID-19 Vaccine (4 - Booster for Pfizer series) 09/17/2020   Zoster Vaccines- Shingrix (2 of 2) 05/22/2021    Colorectal cancer screening: No longer required.   Mammogram status: Completed 11/21/2020. Repeat every year  Bone Density status: Completed 05/21/2020. Results reflect: Bone density results: OSTEOPENIA. Repeat every 2 years.  Lung Cancer Screening: (Low Dose CT Chest recommended if Age 46-80 years, 30 pack-year currently smoking OR have quit w/in 15years.) does not qualify.   Additional Screening:  Hepatitis C Screening: does qualify; Completed 08/29/2020  Vision Screening: Recommended annual ophthalmology exams for early detection of glaucoma and other disorders of the eye. Is the patient up to date with their annual eye exam?  Yes  Who is the provider or what is the name of the office in which the patient attends annual eye exams? [facility] If pt is not established with a provider, would they like to be referred to a provider to establish care? No .   Dental Screening: Recommended annual dental exams for proper oral hygiene  Community Resource Referral / Chronic Care Management: CRR required this visit?  No   CCM required this visit?  No      Plan:     I have personally reviewed and noted the following in the patients chart:   Medical and social history Use of alcohol, tobacco or illicit drugs  Current  medications and supplements including opioid prescriptions.  Functional ability and status Nutritional status Physical activity Advanced directives List of other physicians Hospitalizations, surgeries, and ER visits in previous 12 months Vitals Screenings to include cognitive, depression, and falls Referrals and appointments  In addition, I have reviewed and discussed with patient certain preventive protocols, quality metrics, and best practice recommendations. A written personalized  care plan for preventive services as well as general preventive health recommendations were provided to patient.     [place] Constablemy E Jeris Roser, LPN   4/5/40981/04/2022   Nurse Notes: None

## 2021-09-02 NOTE — Patient Instructions (Signed)
[name] , Thank you for taking time to come for your Medicare Wellness Visit. I appreciate your ongoing commitment to your health goals. Please review the following plan we discussed and let me know if I can assist you in the future.   Screening recommendations/referrals: Colonoscopy: Done 10/09/2020 - no repeat Mammogram: Done 11/21/2020 - Repeat annually  Bone Density: Done 05/21/2020 - Repeat every 2 years  Recommended yearly ophthalmology/optometry visit for glaucoma screening and checkup Recommended yearly dental visit for hygiene and checkup  Vaccinations: Influenza vaccine: Done 05/30/2021 - Repeat annually Pneumococcal vaccine: Done 11/22/2012 & 12/26/2014 Tdap vaccine: Done 12/23/2017 - Repeat in 10 years Shingles vaccine: Done 03/27/2021 - get second dose 09/26/2021   Covid-19:Done 11/30/2019, 12/23/2019, & 07/23/2020  Advanced directives: Advance directive discussed with you today. Even though you declined this today, please call our office should you change your mind, and we can give you the proper paperwork for you to fill out.   Conditions/risks identified: Aim for 30 minutes of exercise or brisk walking each day, drink 6-8 glasses of water and eat lots of fruits and vegetables.   Next appointment: Follow up in one year for your annual wellness visit    Preventive Care 65 Years and Older, Female Preventive care refers to lifestyle choices and visits with your health care provider that can promote health and wellness. What does preventive care include? A yearly physical exam. This is also called an annual well check. Dental exams once or twice a year. Routine eye exams. Ask your health care provider how often you should have your eyes checked. Personal lifestyle choices, including: Daily care of your teeth and gums. Regular physical activity. Eating a healthy diet. Avoiding tobacco and drug use. Limiting alcohol use. Practicing safe sex. Taking low-dose aspirin every day. Taking  vitamin and mineral supplements as recommended by your health care provider. What happens during an annual well check? The services and screenings done by your health care provider during your annual well check will depend on your age, overall health, lifestyle risk factors, and family history of disease. Counseling  Your health care provider may ask you questions about your: Alcohol use. Tobacco use. Drug use. Emotional well-being. Home and relationship well-being. Sexual activity. Eating habits. History of falls. Memory and ability to understand (cognition). Work and work Astronomer. Reproductive health. Screening  You may have the following tests or measurements: Height, weight, and BMI. Blood pressure. Lipid and cholesterol levels. These may be checked every 5 years, or more frequently if you are over 70 years old. Skin check. Lung cancer screening. You may have this screening every year starting at age 32 if you have a 30-pack-year history of smoking and currently smoke or have quit within the past 15 years. Fecal occult blood test (FOBT) of the stool. You may have this test every year starting at age 64. Flexible sigmoidoscopy or colonoscopy. You may have a sigmoidoscopy every 5 years or a colonoscopy every 10 years starting at age 9. Hepatitis C blood test. Hepatitis B blood test. Sexually transmitted disease (STD) testing. Diabetes screening. This is done by checking your blood sugar (glucose) after you have not eaten for a while (fasting). You may have this done every 1-3 years. Bone density scan. This is done to screen for osteoporosis. You may have this done starting at age 27. Mammogram. This may be done every 1-2 years. Talk to your health care provider about how often you should have regular mammograms. Talk with your health  care provider about your test results, treatment options, and if necessary, the need for more tests. Vaccines  Your health care provider may  recommend certain vaccines, such as: Influenza vaccine. This is recommended every year. Tetanus, diphtheria, and acellular pertussis (Tdap, Td) vaccine. You may need a Td booster every 10 years. Zoster vaccine. You may need this after age 78. Pneumococcal 13-valent conjugate (PCV13) vaccine. One dose is recommended after age 78. Pneumococcal polysaccharide (PPSV23) vaccine. One dose is recommended after age 78. Talk to your health care provider about which screenings and vaccines you need and how often you need them. This information is not intended to replace advice given to you by your health care provider. Make sure you discuss any questions you have with your health care provider. Document Released: 09/07/2015 Document Revised: 04/30/2016 Document Reviewed: 06/12/2015 Elsevier Interactive Patient Education  2017 [employer]  Fall Prevention in the Home Falls can cause injuries. They can happen to people of all ages. There are many things you can do to make your home safe and to help prevent falls. What can I do on the outside of my home? Regularly fix the edges of walkways and driveways and fix any cracks. Remove anything that might make you trip as you walk through a door, such as a raised step or threshold. Trim any bushes or trees on the path to your home. Use bright outdoor lighting. Clear any walking paths of anything that might make someone trip, such as rocks or tools. Regularly check to see if handrails are loose or broken. Make sure that both sides of any steps have handrails. Any raised decks and porches should have guardrails on the edges. Have any leaves, snow, or ice cleared regularly. Use sand or salt on walking paths during winter. Clean up any spills in your garage right away. This includes oil or grease spills. What can I do in the bathroom? Use night lights. Install grab bars by the toilet and in the tub and shower. Do not use towel bars as grab bars. Use non-skid  mats or decals in the tub or shower. If you need to sit down in the shower, use a plastic, non-slip stool. Keep the floor dry. Clean up any water that spills on the floor as soon as it happens. Remove soap buildup in the tub or shower regularly. Attach bath mats securely with double-sided non-slip rug tape. Do not have throw rugs and other things on the floor that can make you trip. What can I do in the bedroom? Use night lights. Make sure that you have a light by your bed that is easy to reach. Do not use any sheets or blankets that are too big for your bed. They should not hang down onto the floor. Have a firm chair that has side arms. You can use this for support while you get dressed. Do not have throw rugs and other things on the floor that can make you trip. What can I do in the kitchen? Clean up any spills right away. Avoid walking on wet floors. Keep items that you use a lot in easy-to-reach places. If you need to reach something above you, use a strong step stool that has a grab bar. Keep electrical cords out of the way. Do not use floor polish or wax that makes floors slippery. If you must use wax, use non-skid floor wax. Do not have throw rugs and other things on the floor that can make you trip. What can  I do with my stairs? Do not leave any items on the stairs. Make sure that there are handrails on both sides of the stairs and use them. Fix handrails that are broken or loose. Make sure that handrails are as long as the stairways. Check any carpeting to make sure that it is firmly attached to the stairs. Fix any carpet that is loose or worn. Avoid having throw rugs at the top or bottom of the stairs. If you do have throw rugs, attach them to the floor with carpet tape. Make sure that you have a light switch at the top of the stairs and the bottom of the stairs. If you do not have them, ask someone to add them for you. What else can I do to help prevent falls? Wear shoes  that: Do not have high heels. Have rubber bottoms. Are comfortable and fit you well. Are closed at the toe. Do not wear sandals. If you use a stepladder: Make sure that it is fully opened. Do not climb a closed stepladder. Make sure that both sides of the stepladder are locked into place. Ask someone to hold it for you, if possible. Clearly mark and make sure that you can see: Any grab bars or handrails. First and last steps. Where the edge of each step is. Use tools that help you move around (mobility aids) if they are needed. These include: Canes. Walkers. Scooters. Crutches. Turn on the lights when you go into a dark area. Replace any light bulbs as soon as they burn out. Set up your furniture so you have a clear path. Avoid moving your furniture around. If any of your floors are uneven, fix them. If there are any pets around you, be aware of where they are. Review your medicines with your doctor. Some medicines can make you feel dizzy. This can increase your chance of falling. Ask your doctor what other things that you can do to help prevent falls. This information is not intended to replace advice given to you by your health care provider. Make sure you discuss any questions you have with your health care provider. Document Released: 06/07/2009 Document Revised: 01/17/2016 Document Reviewed: 09/15/2014 Elsevier Interactive Patient Education  2017 [employer].

## 2021-09-26 ENCOUNTER — Encounter: Payer: Self-pay | Admitting: Family Medicine

## 2021-09-26 ENCOUNTER — Ambulatory Visit (INDEPENDENT_AMBULATORY_CARE_PROVIDER_SITE_OTHER): Payer: Medicare Other | Admitting: Family Medicine

## 2021-09-26 VITALS — BP 132/71 | Temp 98.2°F | Ht 61.0 in | Wt 130.8 lb

## 2021-09-26 DIAGNOSIS — I10 Essential (primary) hypertension: Secondary | ICD-10-CM | POA: Diagnosis not present

## 2021-09-26 DIAGNOSIS — E039 Hypothyroidism, unspecified: Secondary | ICD-10-CM

## 2021-09-26 DIAGNOSIS — Z23 Encounter for immunization: Secondary | ICD-10-CM

## 2021-09-26 DIAGNOSIS — E782 Mixed hyperlipidemia: Secondary | ICD-10-CM | POA: Diagnosis not present

## 2021-09-26 LAB — LIPID PANEL
Chol/HDL Ratio: 2 ratio (ref 0.0–4.4)
Cholesterol, Total: 129 mg/dL (ref 100–199)
HDL: 65 mg/dL (ref >39)
LDL Chol Calc (NIH): 49 mg/dL (ref 0–99)
Triglycerides: 77 mg/dL (ref 0–149)
VLDL Cholesterol Cal: 15 mg/dL (ref 5–40)

## 2021-09-26 LAB — CMP14+EGFR
ALT: 17 IU/L (ref 0–32)
AST: 17 IU/L (ref 0–40)
Albumin/Globulin Ratio: 1.5 (ref 1.2–2.2)
Albumin: 4.1 g/dL (ref 3.7–4.7)
Alkaline Phosphatase: 77 IU/L (ref 44–121)
BUN/Creatinine Ratio: 9 — ABNORMAL LOW (ref 12–28)
BUN: 8 mg/dL (ref 8–27)
Bilirubin Total: 0.7 mg/dL (ref 0.0–1.2)
CO2: 25 mmol/L (ref 20–29)
Calcium: 10.5 mg/dL — ABNORMAL HIGH (ref 8.7–10.3)
Chloride: 104 mmol/L (ref 96–106)
Creatinine, Ser: 0.85 mg/dL (ref 0.57–1.00)
Globulin, Total: 2.8 g/dL (ref 1.5–4.5)
Glucose: 93 mg/dL (ref 70–99)
Potassium: 4.8 mmol/L (ref 3.5–5.2)
Sodium: 139 mmol/L (ref 134–144)
Total Protein: 6.9 g/dL (ref 6.0–8.5)
eGFR: 71 mL/min/{1.73_m2} (ref >59)

## 2021-09-26 LAB — CBC WITH DIFFERENTIAL/PLATELET
Basophils Absolute: 0 10*3/uL (ref 0.0–0.2)
Basos: 1 %
EOS (ABSOLUTE): 0.2 10*3/uL (ref 0.0–0.4)
Eos: 4 %
Hematocrit: 38.5 % (ref 34.0–46.6)
Hemoglobin: 13 g/dL (ref 11.1–15.9)
Immature Grans (Abs): 0 10*3/uL (ref 0.0–0.1)
Immature Granulocytes: 0 %
Lymphocytes Absolute: 0.9 10*3/uL (ref 0.7–3.1)
Lymphs: 17 %
MCH: 30.3 pg (ref 26.6–33.0)
MCHC: 33.8 g/dL (ref 31.5–35.7)
MCV: 90 fL (ref 79–97)
Monocytes Absolute: 0.4 10*3/uL (ref 0.1–0.9)
Monocytes: 8 %
Neutrophils Absolute: 3.5 10*3/uL (ref 1.4–7.0)
Neutrophils: 70 %
Platelets: 275 10*3/uL (ref 150–450)
RBC: 4.29 x10E6/uL (ref 3.77–5.28)
RDW: 12.8 % (ref 11.7–15.4)
WBC: 5 10*3/uL (ref 3.4–10.8)

## 2021-09-26 LAB — TSH+FREE T4
Free T4: 1.79 ng/dL — ABNORMAL HIGH (ref 0.82–1.77)
TSH: 0.021 u[IU]/mL — ABNORMAL LOW (ref 0.450–4.500)

## 2021-09-26 MED ORDER — PANTOPRAZOLE SODIUM 40 MG PO TBEC: 40.0000 mg | DELAYED_RELEASE_TABLET | Freq: Every day | ORAL | 3 refills | Status: DC

## 2021-09-26 NOTE — Progress Notes (Signed)
Subjective:  Patient ID: [name], female    DOB: 1946/07/26  Age: 76 y.o. MRN: [id number]  CC: Medical Management of Chronic Issues   HPI MYCHAEL SOOTS presents for  follow-up of hypertension. Patient has no history of headache chest pain or shortness of breath or recent cough. Patient also denies symptoms of TIA such as focal numbness or weakness. Patient denies side effects from medication. States taking it regularly.   follow-up on  thyroid. The patient has a history of hypothyroidism for many years. It has been stable recently. Pt. denies any change in  voice, loss of hair, heat or cold intolerance. Energy level has been adequate to good. Patient denies constipation and diarrhea. No myxedema. Medication is as noted below. Verified that pt is taking it daily on an empty stomach. Well tolerated  Since CVA has felt weaker, Grip decreasaed bilaterally. LEft leg feels weaker. PT helped a lot.    History Rhylynn has a past medical history of Allergy, Asthma, Colon polyps, GERD (gastroesophageal reflux disease), Hyperlipidemia, Hypertension, Menopausal state, Osteopenia, Rhinitis, Thyroid disease, and Vitamin D deficiency disease.   She has a past surgical history that includes Tonsillectomy; Cholecystectomy (2005); and Colonoscopy (2099).   Her family history includes AAA (abdominal aortic aneurysm) in her mother; Asthma in her brother; Colon cancer (age of onset: 70) in her mother; Colon polyps in her mother; Gout in her sister; Heart attack in her father; Heart disease in her father; Hyperlipidemia in her mother; Hypertension in her brother, mother, and sister; Osteoporosis in her mother; Thyroid disease in her mother.She reports that she has never smoked. She has never used smokeless tobacco. She reports that she does not drink alcohol and does not use drugs.  Current Outpatient Medications on File Prior to Visit  Medication Sig Dispense Refill   albuterol (VENTOLIN HFA) 108 (90 Base)  MCG/ACT inhaler Inhale 2 puffs into the lungs every 6 (six) hours as needed for wheezing. 18 g 11   alendronate (FOSAMAX) 70 MG tablet Take 1 tablet (70 mg total) by mouth every 7 (seven) days. Take with a full glass of water on an empty stomach. 13 tablet 3   amLODipine (NORVASC) 5 MG tablet Take 1 tablet (5 mg total) by mouth daily. 90 tablet 3   atorvastatin (LIPITOR) 40 MG tablet Take 1 tablet (40 mg total) by mouth daily. 90 tablet 3   Cholecalciferol (VITAMIN D3) 3000 UNITS TABS Take 2,000 Units by mouth daily.     fluticasone (FLONASE) 50 MCG/ACT nasal spray Use 2 spray(s) in each nostril once daily (Patient taking differently: Place 2 sprays into both nostrils every evening. Use 2 spray(s) in each nostril once daily) 48 g 1   fluticasone-salmeterol (ADVAIR) 100-50 MCG/ACT AEPB Inhale 1 puff into the lungs 2 (two) times daily. 180 each 3   levothyroxine (EUTHYROX) 75 MCG tablet Take 1 tablet (75 mcg total) by mouth daily. 90 tablet 3   lisinopril (ZESTRIL) 20 MG tablet Take 1 tablet (20 mg total) by mouth daily. 90 tablet 3   Multiple Vitamin (MULTIVITAMIN WITH MINERALS) TABS Take 1 tablet by mouth daily.     No current facility-administered medications on file prior to visit.    ROS Review of Systems  Constitutional: Negative.   HENT: Negative.    Eyes:  Negative for visual disturbance.  Respiratory:  Negative for shortness of breath.   Cardiovascular:  Negative for chest pain.  Gastrointestinal:  Negative for abdominal pain.  Musculoskeletal:  Negative  for arthralgias and gait problem.  Neurological:  Negative for dizziness and numbness.   Objective:  BP 132/71    Temp 98.2 F (36.8 C)    Ht _0  (1.549 m)    Wt 130 lb 12.8 oz (59.3 kg)    BMI 24.71 kg/m   BP Readings from Last 3 Encounters:  09/26/21 132/71  08/06/21 140/68  07/03/21 (!) 149/74    Wt Readings from Last 3 Encounters:  09/26/21 130 lb 12.8 oz (59.3 kg)  09/02/21 135 lb (61.2 kg)  08/06/21 133 lb 6.4 oz  (60.5 kg)     Physical Exam Constitutional:      General: She is not in acute distress.    Appearance: She is well-developed.  HENT:     Head: Normocephalic and atraumatic.  Eyes:     Conjunctiva/sclera: Conjunctivae normal.     Pupils: Pupils are equal, round, and reactive to light.  Neck:     Thyroid: No thyromegaly.  Cardiovascular:     Rate and Rhythm: Normal rate and regular rhythm.     Heart sounds: Normal heart sounds. No murmur heard. Pulmonary:     Effort: Pulmonary effort is normal. No respiratory distress.     Breath sounds: Normal breath sounds. No wheezing or rales.  Abdominal:     General: Bowel sounds are normal. There is no distension.     Palpations: Abdomen is soft.     Tenderness: There is no abdominal tenderness.  Musculoskeletal:        General: Normal range of motion.     Cervical back: Normal range of motion and neck supple.  Lymphadenopathy:     Cervical: No cervical adenopathy.  Skin:    General: Skin is warm and dry.  Neurological:     Mental Status: She is alert and oriented to person, place, and time.  Psychiatric:        Behavior: Behavior normal.        Thought Content: Thought content normal.        Judgment: Judgment normal.      Assessment & Plan:   Jordyn was seen today for medical management of chronic issues.  Diagnoses and all orders for this visit:  Hypothyroidism, unspecified type -     TSH + free T4  Mixed hyperlipidemia -     Lipid panel  Essential hypertension -     CBC with Differential/Platelet -     CMP14+EGFR  Other orders -     pantoprazole (PROTONIX) 40 MG tablet; Take 1 tablet (40 mg total) by mouth daily. For stomach   Allergies as of 09/26/2021       Reactions   Evista [raloxifene Hcl] Other (See Comments)   Bone pain, couldn't put weight on right leg/foot   Fosamax [alendronate Sodium]    Bone pain   Red Dye    Sulfa Antibiotics         Medication List        Accurate as of September 26, 2021   8:56 AM. If you have any questions, ask your nurse or doctor.          albuterol 108 (90 Base) MCG/ACT inhaler Commonly known as: VENTOLIN HFA Inhale 2 puffs into the lungs every 6 (six) hours as needed for wheezing.   alendronate 70 MG tablet Commonly known as: FOSAMAX Take 1 tablet (70 mg total) by mouth every 7 (seven) days. Take with a full glass of water on an empty stomach.  amLODipine 5 MG tablet Commonly known as: NORVASC Take 1 tablet (5 mg total) by mouth daily.   atorvastatin 40 MG tablet Commonly known as: LIPITOR Take 1 tablet (40 mg total) by mouth daily.   fluticasone 50 MCG/ACT nasal spray Commonly known as: FLONASE Use 2 spray(s) in each nostril once daily What changed:  how much to take how to take this when to take this   fluticasone-salmeterol 100-50 MCG/ACT Aepb Commonly known as: ADVAIR Inhale 1 puff into the lungs 2 (two) times daily.   levothyroxine 75 MCG tablet Commonly known as: Euthyrox Take 1 tablet (75 mcg total) by mouth daily.   lisinopril 20 MG tablet Commonly known as: ZESTRIL Take 1 tablet (20 mg total) by mouth daily.   multivitamin with minerals Tabs tablet Take 1 tablet by mouth daily.   pantoprazole 40 MG tablet Commonly known as: PROTONIX Take 1 tablet (40 mg total) by mouth daily. For stomach   Vitamin D3 75 MCG (3000 UT) Tabs Take 2,000 Units by mouth daily.        Meds ordered this encounter  Medications   pantoprazole (PROTONIX) 40 MG tablet    Sig: Take 1 tablet (40 mg total) by mouth daily. For stomach    Dispense:  90 tablet    Refill:  3      Follow-up: Return in about 6 months (around 03/26/2022).  [name], M.D.

## 2021-09-26 NOTE — Addendum Note (Signed)
Addended by: Diamantina Monks on: 09/26/2021 04:39 PM   Modules accepted: Orders

## 2021-09-30 ENCOUNTER — Other Ambulatory Visit: Payer: Self-pay | Admitting: Family Medicine

## 2021-09-30 DIAGNOSIS — E039 Hypothyroidism, unspecified: Secondary | ICD-10-CM

## 2021-09-30 MED ORDER — LEVOTHYROXINE SODIUM 50 MCG PO TABS: 50.0000 ug | ORAL_TABLET | Freq: Every day | ORAL | 1 refills | Status: DC

## 2021-09-30 MED ORDER — LEVOTHYROXINE SODIUM 50 MCG PO TABS: 75.0000 ug | ORAL_TABLET | Freq: Every day | ORAL | 1 refills | Status: DC

## 2022-02-10 ENCOUNTER — Encounter: Payer: Self-pay | Admitting: Adult Health

## 2022-02-10 ENCOUNTER — Ambulatory Visit (INDEPENDENT_AMBULATORY_CARE_PROVIDER_SITE_OTHER): Payer: Medicare Other | Admitting: Adult Health

## 2022-02-10 VITALS — BP 154/91 | HR 81 | Ht 61.0 in | Wt 137.5 lb

## 2022-02-10 DIAGNOSIS — I639 Cerebral infarction, unspecified: Secondary | ICD-10-CM

## 2022-02-10 NOTE — Progress Notes (Signed)
[facility] [address] [place]. [place] [address] [phone]       STROKE FOLLOW UP NOTE  [name] Date of Birth:  Nov 17, 1945 Medical Record Number:  [id number]   Reason for Referral: stroke follow up    SUBJECTIVE:   CHIEF COMPLAINT:  Chief Complaint  Patient presents with   Follow-up    Pt reports feeling okay still sometimes feels tired. No questions or concerns. Pt reports some right arm pain.  Room 2, alone.     HPI:   Update 02/10/2022 JM: Patient returns for 65-month stroke follow-up unaccompanied.  Overall stable without new stroke/TIA symptoms. Denies any residual right leg weakness - had great benefit during therapy.  Ambulates without assistive device, denies any recent falls.  Does report occasional mild right arm pain on top of forearm, denies elbow or wrist pain. Does have chronic neck stiffness especially in the morning. Pain present off/on since her stroke.  Also reports increased fatigue especially since synthroid dose decreased back in February.  Mentions occasional nocturnal cramping RLE since her stroke.   Compliant on aspirin and atorvastatin, denies side effects.  Blood pressure today 154/91. Occasionally monitors at home and typically 130s/80s.  Has f/u with PCP in August.  No further concerns at this time      History provided for reference purposes only Initial visit 08/06/2021 JM: Patient being seen for hospital follow up unaccompanied. Reports doing well - denies new stroke/TIA symptoms. Continued RLE weakness but gradually improving. Does report chronic mild hip pain but worsened post stroke. Does not use AD for ambulation - denies any recent falls. Does experience fatigue quickly but has been gradually improving. Currently working with PT. Lives in own home but sister has been staying with her. Has been gradually returning back to do house work and cooking but tries not to over due it.  Completed DAPT currently on  aspirin and atorvastatin without side effects. Blood pressure today 140/68. Occasionally monitors at home and has been stable.  No further concerns at this time  Stroke admission 06/22/2021 [name] is a 76 y.o. female with history of HTN, HLD who presented on 06/22/2021 with transient right hand weakness and aphasia.  Personally reviewed hospitalization pertinent progress notes, lab work and imaging.  Evaluated by Dr. 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  CTA head/neck unremarkable.  EF 60 to 65%.  LDL 105.  A1c 5.1.  Recommended DAPT for 3 weeks and aspirin alone.  HTN stable.  Initiate atorvastatin 40 mg daily.  No prior stroke history.  PT/OT recommended outpatient PT for RLE weakness and discharged home.     PERTINENT IMAGING/LABS  Per recent hospitalization CT no acute finding CTA head and neck unremarkable. MRI  small punctate infarct at left PLIC 2D Echo  EF 60-65% LDL 105 HgbA1c 5.1    ROS:   14 system review of systems performed and negative with exception of those listed in HPI  PMH:  Past Medical History:  Diagnosis Date   Allergy    seasonal allergies   Asthma    uses inhaler    Colon polyps    GERD (gastroesophageal reflux disease)    with certain foods   Hyperlipidemia    on meds   Hypertension    on meds   Menopausal state    Osteopenia    Rhinitis    Thyroid disease    on meds  Vitamin D deficiency disease     PSH:  Past Surgical History:  Procedure Laterality Date   CHOLECYSTECTOMY  2005   COLONOSCOPY  2099   CG-F/V-miralax(exc)-TA   TONSILLECTOMY      Social History:  Social History   Socioeconomic History   Marital status: Single    Spouse name: Not on file   Number of children: 0   Years of education: 12   Highest education level: 12th grade  Occupational History   Occupation: retired    Associate Professor: [employer]    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 Never married and no children. Retired from [employer] in the [employer].    Social Determinants of Health   Financial Resource Strain: Low Risk  (09/02/2021)   Overall Financial Resource Strain (CARDIA)    Difficulty of Paying Living Expenses: Not very hard  Food Insecurity: No Food Insecurity (09/02/2021)   Hunger Vital Sign    Worried About Running Out of Food in the Last Year: Never true    Ran Out of Food in the Last Year: Never true  Transportation Needs: No Transportation Needs (09/02/2021)   PRAPARE - Administrator, Civil Service (Medical): No    Lack of Transportation (Non-Medical): No  Physical Activity: Insufficiently Active (09/02/2021)   Exercise Vital Sign    Days of Exercise per Week: 2 days    Minutes of Exercise per Session: 30 min  Stress: No Stress Concern Present (09/02/2021)   [employer] of Occupational Health - Occupational Stress Questionnaire    Feeling of Stress : Not at all  Social Connections: Socially Isolated (09/02/2021)   Social Connection and Isolation Panel [NHANES]    Frequency of Communication with Friends and Family: More than three times a week    Frequency of Social Gatherings with Friends and Family: More than three times a week    Attends Religious Services: Never    Database administrator or Organizations: No    Attends Banker Meetings: Never    Marital Status: Never married  Intimate Partner Violence: Not At Risk (09/02/2021)   Humiliation, Afraid, Rape, and Kick questionnaire    Fear of Current or Ex-Partner: No    Emotionally Abused: No    Physically Abused: No    Sexually Abused: No    Family History:  Family History   Problem Relation Age of Onset   Hyperlipidemia Mother    Hypertension Mother    Thyroid disease Mother    Osteoporosis Mother    Colon polyps Mother    Colon cancer Mother 73   AAA (abdominal aortic aneurysm) Mother    Heart disease Father    Heart attack Father        heavy smoker   Hypertension Sister    Gout Sister    Hypertension Brother    Asthma Brother    Esophageal cancer Neg Hx    Stomach cancer Neg Hx     Medications:   Current Outpatient Medications on File Prior to Visit  Medication Sig Dispense Refill   albuterol (VENTOLIN HFA) 108 (90 Base) MCG/ACT inhaler Inhale 2  puffs into the lungs every 6 (six) hours as needed for wheezing. 18 g 11   alendronate (FOSAMAX) 70 MG tablet Take 1 tablet (70 mg total) by mouth every 7 (seven) days. Take with a full glass of water on an empty stomach. 13 tablet 3   amLODipine (NORVASC) 5 MG tablet Take 1 tablet (5 mg total) by mouth daily. 90 tablet 3   atorvastatin (LIPITOR) 40 MG tablet Take 1 tablet (40 mg total) by mouth daily. 90 tablet 3   Cholecalciferol (VITAMIN D3) 3000 UNITS TABS Take 2,000 Units by mouth daily.     fluticasone (FLONASE) 50 MCG/ACT nasal spray Use 2 spray(s) in each nostril once daily (Patient taking differently: Place 2 sprays into both nostrils every evening. Use 2 spray(s) in each nostril once daily) 48 g 1   fluticasone-salmeterol (ADVAIR) 100-50 MCG/ACT AEPB Inhale 1 puff into the lungs 2 (two) times daily. 180 each 3   levothyroxine (EUTHYROX) 50 MCG tablet Take 1 tablet (50 mcg total) by mouth daily. Correction from the previous scrip. Dose is one daily, not 1.5 daily 90 tablet 1   lisinopril (ZESTRIL) 20 MG tablet Take 1 tablet (20 mg total) by mouth daily. 90 tablet 3   Multiple Vitamin (MULTIVITAMIN WITH MINERALS) TABS Take 1 tablet by mouth daily.     pantoprazole (PROTONIX) 40 MG tablet Take 1 tablet (40 mg total) by mouth daily. For stomach 90 tablet 3   No current facility-administered  medications on file prior to visit.    Allergies:   Allergies  Allergen Reactions   Evista [Raloxifene Hcl] Other (See Comments)    Bone pain, couldn't put weight on right leg/foot   Fosamax [Alendronate Sodium]     Bone pain   Red Dye    Sulfa Antibiotics       OBJECTIVE:  Physical Exam  Vitals:   02/10/22 1039  BP: (!) 154/91  Pulse: 81  Weight: 137 lb 8 oz (62.4 kg)  Height: 5\' 1"  (1.549 m)    Body mass index is 25.98 kg/m. No results found.   General: well developed, well nourished, very pleasant elderly Caucasian female seated, in no evident distress Head: head normocephalic and atraumatic.   Neck: supple with no carotid or supraclavicular bruits Cardiovascular: regular rate and rhythm, no murmurs Musculoskeletal: no deformity Skin:  no rash/petichiae Vascular:  Normal pulses all extremities   Neurologic Exam Mental Status: Awake and fully alert.  Fluent speech and language.  Oriented to place and time. Recent and remote memory intact. Attention span, concentration and fund of knowledge appropriate. Mood and affect appropriate.  Cranial Nerves: Pupils equal, briskly reactive to light. Extraocular movements full without nystagmus. Visual fields full to confrontation. Hearing intact. Facial sensation intact. Face, tongue, palate moves normally and symmetrically.  Motor: Normal bulk and tone. Normal strength in all tested extremity muscles  Sensory.: intact to touch , pinprick , position and vibratory sensation.  Coordination: Rapid alternating movements normal in all extremities. Finger-to-nose and heel-to-shin performed accurately bilaterally. Gait and Station: Arises from chair without difficulty. Stance is normal. Gait demonstrates slightly stiffened RLE initially but gradually improved to normal stride length and step height although mild imbalance without use of assistive device. Unable to perform tandem walk and heel toe  Reflexes: 1+ and symmetric. Toes  downgoing.         ASSESSMENT: [name] is a 76 y.o. year old female with left PLIC small punctate infarct on 06/22/2021 likely secondary to small  vessel disease source. Vascular risk factors include HTN, HLD and advanced age.      PLAN:  L PLIC stroke :  Has made great recovery without any evidence of residual right-sided weakness. Does have right forearm pain, not overly bothersome, advised to continue to monitor for now but to f/u with PCP if pain should worsen. Does have occasional RLE cramping -discussed use of magnesium as instructed on label for possible benefit as well as ensuring adequate water intake -if symptoms persist, advised her to follow-up with PCP to ensure no other underlying etiologies continue aspirin 81 mg daily  and atorvastatin 40 mg daily for secondary stroke prevention.   Discussed secondary stroke prevention measures and importance of close PCP follow up for aggressive stroke risk factor management including BP goal<130/90, and HLD with LDL goal<70 LDL 49 (09/2021), A1c 5.1 (81/2751) I have gone over the pathophysiology of stroke, warning signs and symptoms, risk factors and their management in some detail with instructions to go to the closest emergency room for symptoms of concern.    Doing well from stroke standpoint without further recommendations and risk factors are managed by PCP. She may follow up PRN, as usual for our patients who are strictly being followed for stroke. If any new neurological issues should arise, request PCP place referral for evaluation by one of our neurologists. Thank you.     CC:  PCP: Mechele Claude, MD    I spent 31 minutes of face-to-face and non-face-to-face time with patient.  This included previsit chart review, lab review, study review, electronic health record documentation, patient education regarding prior stroke, other symptom concerns as noted above, secondary stroke prevention measures and importance of managing  stroke risk factors and answered all other questions to patient satisfaction   Ihor Austin, [facility]  [facility] [address] [place], [place] [address]  Phone [phone] Fax [phone] Note: This document was prepared with digital dictation and possible smart phrase technology. Any transcriptional errors that result from this process are unintentional.

## 2022-02-10 NOTE — Patient Instructions (Addendum)
As you have been stable from a stroke standpoint, can follow-up on an as-needed basis.   Recommendations:  Continue aspirin 81 mg daily  and atorvastatin for secondary stroke prevention  You can try over the counter magnesium supplement to see if this helps with your cramps, also make sure you are drinking at least 64 oz of water per day. If symptoms persist, would recommend you further discuss with your PCP  Continue to follow up with PCP regarding cholesterol and blood pressure management  Maintain strict control of hypertension with blood pressure goal below 130/90 and cholesterol with LDL cholesterol (bad cholesterol) goal below 70 mg/dL.   Signs of a Stroke? Follow the BEFAST method:  Balance Watch for a sudden loss of balance, trouble with coordination or vertigo Eyes Is there a sudden loss of vision in one or both eyes? Or double vision?  Face: Ask the person to smile. Does one side of the face droop or is it numb?  Arms: Ask the person to raise both arms. Does one arm drift downward? Is there weakness or numbness of a leg? Speech: Ask the person to repeat a simple phrase. Does the speech sound slurred/strange? Is the person confused ? Time: If you observe any of these signs, call 911.      Thank you for coming to see [place] at [facility]. I hope we have been able to provide you high quality care today.  You may receive a patient satisfaction survey over the next few weeks. We would appreciate your feedback and comments so that we may continue to improve ourselves and the health of our patients.

## 2022-03-26 ENCOUNTER — Encounter: Payer: Self-pay | Admitting: Family Medicine

## 2022-03-26 ENCOUNTER — Ambulatory Visit (INDEPENDENT_AMBULATORY_CARE_PROVIDER_SITE_OTHER): Payer: Medicare Other | Admitting: Family Medicine

## 2022-03-26 VITALS — BP 133/67 | HR 77 | Temp 98.1°F | Ht 61.0 in | Wt 137.6 lb

## 2022-03-26 DIAGNOSIS — I639 Cerebral infarction, unspecified: Secondary | ICD-10-CM | POA: Diagnosis not present

## 2022-03-26 DIAGNOSIS — I10 Essential (primary) hypertension: Secondary | ICD-10-CM

## 2022-03-26 DIAGNOSIS — E782 Mixed hyperlipidemia: Secondary | ICD-10-CM

## 2022-03-26 DIAGNOSIS — E039 Hypothyroidism, unspecified: Secondary | ICD-10-CM

## 2022-03-26 MED ORDER — ALENDRONATE SODIUM 70 MG PO TABS: 70.0000 mg | ORAL_TABLET | ORAL | 3 refills | Status: DC

## 2022-03-26 MED ORDER — LISINOPRIL 20 MG PO TABS: 20.0000 mg | ORAL_TABLET | Freq: Every day | ORAL | 3 refills | Status: DC

## 2022-03-26 MED ORDER — ATORVASTATIN CALCIUM 40 MG PO TABS: 40.0000 mg | ORAL_TABLET | Freq: Every day | ORAL | 3 refills | Status: DC

## 2022-03-26 MED ORDER — FLUTICASONE-SALMETEROL 100-50 MCG/ACT IN AEPB: 1.0000 | INHALATION_SPRAY | Freq: Two times a day (BID) | RESPIRATORY_TRACT | 3 refills | Status: DC

## 2022-03-26 MED ORDER — AMLODIPINE BESYLATE 5 MG PO TABS: 5.0000 mg | ORAL_TABLET | Freq: Every day | ORAL | 3 refills | Status: DC

## 2022-03-26 NOTE — Progress Notes (Signed)
Subjective:  Patient ID: [name], female    DOB: 11/04/1945  Age: 76 y.o. MRN: [id number]  CC: Medical Management of Chronic Issues   HPI [name] presents for  presents for  follow-up of hypertension. Patient has no history of headache chest pain or shortness of breath or recent cough. Patient also denies symptoms of TIA such as focal numbness or weakness. Patient denies side effects from medication. States taking it regularly.  Energy poor since decrease of thyroid med. OTherwise denies sx including hair loss, constipation & xs cold feeling.     03/26/2022    8:25 AM 03/26/2022    8:17 AM 09/26/2021    8:46 AM  Depression screen PHQ 2/9  Decreased Interest 0 0 0  Down, Depressed, Hopeless 0 0 0  PHQ - 2 Score 0 0 0  Altered sleeping 0  1  Tired, decreased energy 2  1  Change in appetite 1  0  Feeling bad or failure about yourself  0  0  Trouble concentrating 0  0  Moving slowly or fidgety/restless 0  0  Suicidal thoughts 0  0  PHQ-9 Score 3  2  Difficult doing work/chores Not difficult at all  Not difficult at all    History [name] has a past medical history of Allergy, Asthma, Colon polyps, GERD (gastroesophageal reflux disease), Hyperlipidemia, Hypertension, Menopausal state, Osteopenia, Rhinitis, Thyroid disease, and Vitamin D deficiency disease.   [name] has a past surgical history that includes Tonsillectomy; Cholecystectomy (2005); and Colonoscopy (2099).   Her family history includes AAA (abdominal aortic aneurysm) in her mother; Asthma in her brother; Colon cancer (age of onset: 57) in her mother; Colon polyps in her mother; Gout in her sister; Heart attack in her father; Heart disease in her father; Hyperlipidemia in her mother; Hypertension in her brother, mother, and sister; Osteoporosis in her mother; Thyroid disease in her mother.[name] reports that [name] has never smoked. [name] has never used smokeless tobacco. [name] reports that [name] does not drink alcohol and does not  use drugs.    ROS Review of Systems  Constitutional: Negative.   HENT: Negative.    Eyes:  Negative for visual disturbance.  Respiratory:  Negative for shortness of breath.   Cardiovascular:  Negative for chest pain.  Gastrointestinal:  Negative for abdominal pain.  Musculoskeletal:  Negative for arthralgias.    Objective:  BP 133/67   Pulse 77   Temp 98.1 F (36.7 C)   Ht 5' 1" (1.549 m)   Wt 137 lb 9.6 oz (62.4 kg)   SpO2 97%   BMI 26.00 kg/m   BP Readings from Last 3 Encounters:  03/26/22 133/67  02/10/22 (!) 154/91  09/26/21 132/71    Wt Readings from Last 3 Encounters:  03/26/22 137 lb 9.6 oz (62.4 kg)  02/10/22 137 lb 8 oz (62.4 kg)  09/26/21 130 lb 12.8 oz (59.3 kg)     Physical Exam Constitutional:      General: [name] is not in acute distress.    Appearance: [name] is well-developed.  Cardiovascular:     Rate and Rhythm: Normal rate and regular rhythm.  Pulmonary:     Breath sounds: Normal breath sounds.  Abdominal:     Palpations: Abdomen is soft. There is no mass.     Tenderness: There is no abdominal tenderness.  Musculoskeletal:        General: Normal range of motion.  Skin:    General: Skin is warm and  dry.  Neurological:     Mental Status: [name] is alert and oriented to person, place, and time.       Assessment & Plan:   [name] was seen today for medical management of chronic issues.  Diagnoses and all orders for this visit:  Hypothyroidism, unspecified type -     TSH + free T4  Mixed hyperlipidemia -     Lipid panel  Essential hypertension -     CBC with Differential/Platelet -     CMP14+EGFR -     amLODipine (NORVASC) 5 MG tablet; Take 1 tablet (5 mg total) by mouth daily. -     lisinopril (ZESTRIL) 20 MG tablet; Take 1 tablet (20 mg total) by mouth daily.  Other orders -     alendronate (FOSAMAX) 70 MG tablet; Take 1 tablet (70 mg total) by mouth every 7 (seven) days. Take with a full glass of water on an empty stomach. -      atorvastatin (LIPITOR) 40 MG tablet; Take 1 tablet (40 mg total) by mouth daily. -     fluticasone-salmeterol (ADVAIR) 100-50 MCG/ACT AEPB; Inhale 1 puff into the lungs 2 (two) times daily.       I am having Jennifer P. Cheramie "[name]" maintain her multivitamin with minerals, Vitamin D3, albuterol, fluticasone, pantoprazole, levothyroxine, aspirin EC, alendronate, amLODipine, lisinopril, atorvastatin, and fluticasone-salmeterol.  Allergies as of 03/26/2022       Reactions   Evista [raloxifene Hcl] Other (See Comments)   Bone pain, couldn't put weight on right leg/foot   Fosamax [alendronate Sodium]    Bone pain   Red Dye    Sulfa Antibiotics         Medication List        Accurate as of March 26, 2022  8:52 AM. If you have any questions, ask your nurse or doctor.          albuterol 108 (90 Base) MCG/ACT inhaler Commonly known as: VENTOLIN HFA Inhale 2 puffs into the lungs every 6 (six) hours as needed for wheezing.   alendronate 70 MG tablet Commonly known as: FOSAMAX Take 1 tablet (70 mg total) by mouth every 7 (seven) days. Take with a full glass of water on an empty stomach.   amLODipine 5 MG tablet Commonly known as: NORVASC Take 1 tablet (5 mg total) by mouth daily.   aspirin EC 81 MG tablet Take 81 mg by mouth daily. Swallow whole.   atorvastatin 40 MG tablet Commonly known as: LIPITOR Take 1 tablet (40 mg total) by mouth daily.   fluticasone 50 MCG/ACT nasal spray Commonly known as: FLONASE Use 2 spray(s) in each nostril once daily What changed:  how much to take how to take this when to take this   fluticasone-salmeterol 100-50 MCG/ACT Aepb Commonly known as: ADVAIR Inhale 1 puff into the lungs 2 (two) times daily.   levothyroxine 50 MCG tablet Commonly known as: Euthyrox Take 1 tablet (50 mcg total) by mouth daily. Correction from the previous scrip. Dose is one daily, not 1.5 daily   lisinopril 20 MG tablet Commonly known as: ZESTRIL Take 1  tablet (20 mg total) by mouth daily.   multivitamin with minerals Tabs tablet Take 1 tablet by mouth daily.   pantoprazole 40 MG tablet Commonly known as: PROTONIX Take 1 tablet (40 mg total) by mouth daily. For stomach   Vitamin D3 75 MCG (3000 UT) Tabs Take 2,000 Units by mouth daily.  Follow-up: Return in about 6 months (around 09/26/2022).  [name], M.D.

## 2022-03-27 LAB — CBC WITH DIFFERENTIAL/PLATELET
Basophils Absolute: 0.1 10*3/uL (ref 0.0–0.2)
Basos: 1 %
EOS (ABSOLUTE): 0.1 10*3/uL (ref 0.0–0.4)
Eos: 2 %
Hematocrit: 39.3 % (ref 34.0–46.6)
Hemoglobin: 12.9 g/dL (ref 11.1–15.9)
Immature Grans (Abs): 0 10*3/uL (ref 0.0–0.1)
Immature Granulocytes: 0 %
Lymphocytes Absolute: 0.8 10*3/uL (ref 0.7–3.1)
Lymphs: 15 %
MCH: 30.4 pg (ref 26.6–33.0)
MCHC: 32.8 g/dL (ref 31.5–35.7)
MCV: 93 fL (ref 79–97)
Monocytes Absolute: 0.3 10*3/uL (ref 0.1–0.9)
Monocytes: 6 %
Neutrophils Absolute: 3.9 10*3/uL (ref 1.4–7.0)
Neutrophils: 76 %
Platelets: 268 10*3/uL (ref 150–450)
RBC: 4.24 x10E6/uL (ref 3.77–5.28)
RDW: 12.8 % (ref 11.7–15.4)
WBC: 5.2 10*3/uL (ref 3.4–10.8)

## 2022-03-27 LAB — CMP14+EGFR
ALT: 14 IU/L (ref 0–32)
AST: 14 IU/L (ref 0–40)
Albumin/Globulin Ratio: 1.5 (ref 1.2–2.2)
Albumin: 4.1 g/dL (ref 3.8–4.8)
Alkaline Phosphatase: 74 IU/L (ref 44–121)
BUN/Creatinine Ratio: 9 — ABNORMAL LOW (ref 12–28)
BUN: 8 mg/dL (ref 8–27)
Bilirubin Total: 0.8 mg/dL (ref 0.0–1.2)
CO2: 21 mmol/L (ref 20–29)
Calcium: 10.1 mg/dL (ref 8.7–10.3)
Chloride: 103 mmol/L (ref 96–106)
Creatinine, Ser: 0.89 mg/dL (ref 0.57–1.00)
Globulin, Total: 2.8 g/dL (ref 1.5–4.5)
Glucose: 89 mg/dL (ref 70–99)
Potassium: 4.3 mmol/L (ref 3.5–5.2)
Sodium: 138 mmol/L (ref 134–144)
Total Protein: 6.9 g/dL (ref 6.0–8.5)
eGFR: 67 mL/min/{1.73_m2} (ref >59)

## 2022-03-27 LAB — LIPID PANEL
Chol/HDL Ratio: 1.8 ratio (ref 0.0–4.4)
Cholesterol, Total: 125 mg/dL (ref 100–199)
HDL: 69 mg/dL (ref >39)
LDL Chol Calc (NIH): 39 mg/dL (ref 0–99)
Triglycerides: 87 mg/dL (ref 0–149)
VLDL Cholesterol Cal: 17 mg/dL (ref 5–40)

## 2022-03-27 LAB — TSH+FREE T4
Free T4: 1.29 ng/dL (ref 0.82–1.77)
TSH: 3.55 u[IU]/mL (ref 0.450–4.500)

## 2022-03-27 NOTE — Progress Notes (Signed)
Hello Katerina,  Your lab result is normal and/or stable.Some minor variations that are not significant are commonly marked abnormal, but do not represent any medical problem for you.  Best regards, Gavyn Zoss, M.D.

## 2022-03-31 ENCOUNTER — Other Ambulatory Visit: Payer: Self-pay | Admitting: Family Medicine

## 2022-03-31 DIAGNOSIS — E039 Hypothyroidism, unspecified: Secondary | ICD-10-CM

## 2022-04-29 ENCOUNTER — Other Ambulatory Visit: Payer: Self-pay | Admitting: Family Medicine

## 2022-04-29 DIAGNOSIS — Z1231 Encounter for screening mammogram for malignant neoplasm of breast: Secondary | ICD-10-CM

## 2022-05-14 ENCOUNTER — Ambulatory Visit
Admission: RE | Admit: 2022-05-14 | Discharge: 2022-05-14 | Disposition: A | Discharge status: Home / Self Care | Payer: Medicare Other | Source: Ambulatory Visit | Attending: Family Medicine | Admitting: Family Medicine

## 2022-05-14 DIAGNOSIS — Z1231 Encounter for screening mammogram for malignant neoplasm of breast: Secondary | ICD-10-CM | POA: Diagnosis not present

## 2022-05-22 ENCOUNTER — Ambulatory Visit (INDEPENDENT_AMBULATORY_CARE_PROVIDER_SITE_OTHER): Payer: Medicare Other

## 2022-05-22 DIAGNOSIS — Z23 Encounter for immunization: Secondary | ICD-10-CM | POA: Diagnosis not present

## 2022-06-11 ENCOUNTER — Other Ambulatory Visit: Payer: Self-pay | Admitting: Family Medicine

## 2022-06-11 DIAGNOSIS — J4540 Moderate persistent asthma, uncomplicated: Secondary | ICD-10-CM

## 2022-09-02 NOTE — Patient Instructions (Signed)
[name] , Thank you for taking time to come for your Medicare Wellness Visit. I appreciate your ongoing commitment to your health goals. Please review the following plan we discussed and let me know if I can assist you in the future.   These are the goals we discussed:  Goals      AWV     08/31/2020 AWV Goal: Exercise for General Health  Patient will verbalize understanding of the benefits of increased physical activity: Exercising regularly is important. It will improve your overall fitness, flexibility, and endurance. Regular exercise also will improve your overall health. It can help you control your weight, reduce stress, and improve your bone density. Over the next year, patient will increase physical activity as tolerated with a goal of at least 150 minutes of moderate physical activity per week.  You can tell that you are exercising at a moderate intensity if your heart starts beating faster and you start breathing faster but can still hold a conversation. Moderate-intensity exercise ideas include: Walking 1 mile (1.6 km) in about 15 minutes Biking Hiking Golfing Dancing Water aerobics Patient will verbalize understanding of everyday activities that increase physical activity by providing examples like the following: Yard work, such as: Insurance underwriter Gardening Washing windows or floors Patient will be able to explain general safety guidelines for exercising:  Before you start a new exercise program, talk with your health care provider. Do not exercise so much that you hurt yourself, feel dizzy, or get very short of breath. Wear comfortable clothes and wear shoes with good support. Drink plenty of water while you exercise to prevent dehydration or heat stroke. Work out until your breathing and your heartbeat get faster.      DIET - INCREASE WATER INTAKE     6 to 8 glasses a day      Exercise 3x per week (30 min per time)     Walk for 30 minutes at least 3 times per week.         This is a list of the screening recommended for you and due dates:  Health Maintenance  Topic Date Due   COVID-19 Vaccine (4 - 2023-24 season) 04/25/2022   DEXA scan (bone density measurement)  05/21/2022   Medicare Annual Wellness Visit  09/02/2022   Mammogram  05/15/2023   DTaP/Tdap/Td vaccine (3 - Td or Tdap) 12/24/2027   Pneumonia Vaccine  Completed   Flu Shot  Completed   Hepatitis C Screening: USPSTF Recommendation to screen - Ages 18-79 yo.  Completed   Zoster (Shingles) Vaccine  Completed   HPV Vaccine  Aged Out   Colon Cancer Screening  Discontinued    Advanced directives: Forms are available if you choose in the future to pursue completion.  This is recommended in order to make sure that your health wishes are honored in the event that you are unable to verbalize them to the provider.    Conditions/risks identified: Aim for 30 minutes of exercise or brisk walking, 6-8 glasses of water, and 5 servings of fruits and vegetables each day.   Next appointment: Follow up in one year for your annual wellness visit    Preventive Care 65 Years and Older, Female Preventive care refers to lifestyle choices and visits with your health care provider that can promote health and wellness. What does preventive care include? A yearly physical exam. This is also called an annual  well check. Dental exams once or twice a year. Routine eye exams. Ask your health care provider how often you should have your eyes checked. Personal lifestyle choices, including: Daily care of your teeth and gums. Regular physical activity. Eating a healthy diet. Avoiding tobacco and drug use. Limiting alcohol use. Practicing safe sex. Taking low-dose aspirin every day. Taking vitamin and mineral supplements as recommended by your health care provider. What happens during an annual well check? The services  and screenings done by your health care provider during your annual well check will depend on your age, overall health, lifestyle risk factors, and family history of disease. Counseling  Your health care provider may ask you questions about your: Alcohol use. Tobacco use. Drug use. Emotional well-being. Home and relationship well-being. Sexual activity. Eating habits. History of falls. Memory and ability to understand (cognition). Work and work Statistician. Reproductive health. Screening  You may have the following tests or measurements: Height, weight, and BMI. Blood pressure. Lipid and cholesterol levels. These may be checked every 5 years, or more frequently if you are over 66 years old. Skin check. Lung cancer screening. You may have this screening every year starting at age 35 if you have a 30-pack-year history of smoking and currently smoke or have quit within the past 15 years. Fecal occult blood test (FOBT) of the stool. You may have this test every year starting at age 66. Flexible sigmoidoscopy or colonoscopy. You may have a sigmoidoscopy every 5 years or a colonoscopy every 10 years starting at age 26. Hepatitis C blood test. Hepatitis B blood test. Sexually transmitted disease (STD) testing. Diabetes screening. This is done by checking your blood sugar (glucose) after you have not eaten for a while (fasting). You may have this done every 1-3 years. Bone density scan. This is done to screen for osteoporosis. You may have this done starting at age 13. Mammogram. This may be done every 1-2 years. Talk to your health care provider about how often you should have regular mammograms. Talk with your health care provider about your test results, treatment options, and if necessary, the need for more tests. Vaccines  Your health care provider may recommend certain vaccines, such as: Influenza vaccine. This is recommended every year. Tetanus, diphtheria, and acellular pertussis  (Tdap, Td) vaccine. You may need a Td booster every 10 years. Zoster vaccine. You may need this after age 3. Pneumococcal 13-valent conjugate (PCV13) vaccine. One dose is recommended after age 9. Pneumococcal polysaccharide (PPSV23) vaccine. One dose is recommended after age 92. Talk to your health care provider about which screenings and vaccines you need and how often you need them. This information is not intended to replace advice given to you by your health care provider. Make sure you discuss any questions you have with your health care provider. Document Released: 09/07/2015 Document Revised: 04/30/2016 Document Reviewed: 06/12/2015 Elsevier Interactive Patient Education  2017 [employer] Prevention in the Home Falls can cause injuries. They can happen to people of all ages. There are many things you can do to make your home safe and to help prevent falls. What can I do on the outside of my home? Regularly fix the edges of walkways and driveways and fix any cracks. Remove anything that might make you trip as you walk through a door, such as a raised step or threshold. Trim any bushes or trees on the path to your home. Use bright outdoor lighting. Clear any walking paths of anything that  might make someone trip, such as rocks or tools. Regularly check to see if handrails are loose or broken. Make sure that both sides of any steps have handrails. Any raised decks and porches should have guardrails on the edges. Have any leaves, snow, or ice cleared regularly. Use sand or salt on walking paths during winter. Clean up any spills in your garage right away. This includes oil or grease spills. What can I do in the bathroom? Use night lights. Install grab bars by the toilet and in the tub and shower. Do not use towel bars as grab bars. Use non-skid mats or decals in the tub or shower. If you need to sit down in the shower, use a plastic, non-slip stool. Keep the floor dry. Clean  up any water that spills on the floor as soon as it happens. Remove soap buildup in the tub or shower regularly. Attach bath mats securely with double-sided non-slip rug tape. Do not have throw rugs and other things on the floor that can make you trip. What can I do in the bedroom? Use night lights. Make sure that you have a light by your bed that is easy to reach. Do not use any sheets or blankets that are too big for your bed. They should not hang down onto the floor. Have a firm chair that has side arms. You can use this for support while you get dressed. Do not have throw rugs and other things on the floor that can make you trip. What can I do in the kitchen? Clean up any spills right away. Avoid walking on wet floors. Keep items that you use a lot in easy-to-reach places. If you need to reach something above you, use a strong step stool that has a grab bar. Keep electrical cords out of the way. Do not use floor polish or wax that makes floors slippery. If you must use wax, use non-skid floor wax. Do not have throw rugs and other things on the floor that can make you trip. What can I do with my stairs? Do not leave any items on the stairs. Make sure that there are handrails on both sides of the stairs and use them. Fix handrails that are broken or loose. Make sure that handrails are as long as the stairways. Check any carpeting to make sure that it is firmly attached to the stairs. Fix any carpet that is loose or worn. Avoid having throw rugs at the top or bottom of the stairs. If you do have throw rugs, attach them to the floor with carpet tape. Make sure that you have a light switch at the top of the stairs and the bottom of the stairs. If you do not have them, ask someone to add them for you. What else can I do to help prevent falls? Wear shoes that: Do not have high heels. Have rubber bottoms. Are comfortable and fit you well. Are closed at the toe. Do not wear sandals. If you  use a stepladder: Make sure that it is fully opened. Do not climb a closed stepladder. Make sure that both sides of the stepladder are locked into place. Ask someone to hold it for you, if possible. Clearly mark and make sure that you can see: Any grab bars or handrails. First and last steps. Where the edge of each step is. Use tools that help you move around (mobility aids) if they are needed. These include: Canes. Walkers. Scooters. Crutches. Turn on the lights  when you go into a dark area. Replace any light bulbs as soon as they burn out. Set up your furniture so you have a clear path. Avoid moving your furniture around. If any of your floors are uneven, fix them. If there are any pets around you, be aware of where they are. Review your medicines with your doctor. Some medicines can make you feel dizzy. This can increase your chance of falling. Ask your doctor what other things that you can do to help prevent falls. This information is not intended to replace advice given to you by your health care provider. Make sure you discuss any questions you have with your health care provider. Document Released: 06/07/2009 Document Revised: 01/17/2016 Document Reviewed: 09/15/2014 Elsevier Interactive Patient Education  2017 [employer].

## 2022-09-02 NOTE — Progress Notes (Unsigned)
Subjective:   [name] is a 77 y.o. female who presents for Medicare Annual (Subsequent) preventive examination.  Review of Systems    ***       Objective:    There were no vitals filed for this visit. There is no height or weight on file to calculate BMI.     09/02/2021    8:20 AM 07/03/2021    1:17 PM 06/22/2021   12:33 PM 08/31/2020    8:29 AM 08/09/2020    5:22 PM 03/10/2019    2:10 PM 10/26/2017    3:32 PM  Advanced Directives  Does Patient Have a Medical Advance Directive? No No No No No No No  Would patient like information on creating a medical advance directive? No - Patient declined  No - Patient declined No - Patient declined  Yes (MAU/Ambulatory/Procedural Areas - Information given) No - Patient declined    Current Medications (verified) Outpatient Encounter Medications as of 09/03/2022  Medication Sig   albuterol (VENTOLIN HFA) 108 (90 Base) MCG/ACT inhaler Inhale 2 puffs into the lungs every 6 (six) hours as needed for wheezing.   alendronate (FOSAMAX) 70 MG tablet Take 1 tablet (70 mg total) by mouth every 7 (seven) days. Take with a full glass of water on an empty stomach.   amLODipine (NORVASC) 5 MG tablet Take 1 tablet (5 mg total) by mouth daily.   aspirin EC 81 MG tablet Take 81 mg by mouth daily. Swallow whole.   atorvastatin (LIPITOR) 40 MG tablet Take 1 tablet (40 mg total) by mouth daily.   Cholecalciferol (VITAMIN D3) 3000 UNITS TABS Take 2,000 Units by mouth daily.   fluticasone (FLONASE) 50 MCG/ACT nasal spray Use 2 spray(s) in each nostril once daily   fluticasone-salmeterol (ADVAIR) 100-50 MCG/ACT AEPB Inhale 1 puff into the lungs 2 (two) times daily.   levothyroxine (SYNTHROID) 50 MCG tablet Take 1 tablet by mouth once daily   lisinopril (ZESTRIL) 20 MG tablet Take 1 tablet (20 mg total) by mouth daily.   Multiple Vitamin (MULTIVITAMIN WITH MINERALS) TABS Take 1 tablet by mouth daily.   pantoprazole (PROTONIX) 40 MG tablet Take 1 tablet (40 mg  total) by mouth daily. For stomach   No facility-administered encounter medications on file as of 09/03/2022.    Allergies (verified) Evista [raloxifene hcl], Fosamax [alendronate sodium], Red dye, and Sulfa antibiotics   History: Past Medical History:  Diagnosis Date   Allergy    seasonal allergies   Asthma    uses inhaler    Colon polyps    GERD (gastroesophageal reflux disease)    with certain foods   Hyperlipidemia    on meds   Hypertension    on meds   Menopausal state    Osteopenia    Rhinitis    Thyroid disease    on meds   Vitamin D deficiency disease    Past Surgical History:  Procedure Laterality Date   CHOLECYSTECTOMY  2005   COLONOSCOPY  2099   CG-F/V-miralax(exc)-TA   TONSILLECTOMY     Family History  Problem Relation Age of Onset   Hyperlipidemia Mother    Hypertension Mother    Thyroid disease Mother    Osteoporosis Mother    Colon polyps Mother    Colon cancer Mother 56   AAA (abdominal aortic aneurysm) Mother    Heart disease Father    Heart attack Father        heavy smoker   Hypertension Sister  Gout Sister    Hypertension Brother    Asthma Brother    Esophageal cancer Neg Hx    Stomach cancer Neg Hx    Breast cancer Neg Hx    Social History   Socioeconomic History   Marital status: Single    Spouse name: Not on file   Number of children: 0   Years of education: 12   Highest education level: 12th grade  Occupational History   Occupation: retired    Associate Professor: [employer]    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 Never married and no children. Retired from [employer] in the [employer].    Social Determinants of Health    Financial Resource Strain: Low Risk  (09/02/2021)   Overall Financial Resource Strain (CARDIA)    Difficulty of Paying Living Expenses: Not very hard  Food Insecurity: No Food Insecurity (09/02/2021)   Hunger Vital Sign    Worried About Running Out of Food in the Last Year: Never true    Ran Out of Food in the Last Year: Never true  Transportation Needs: No Transportation Needs (09/02/2021)   PRAPARE - Administrator, Civil Service (Medical): No    Lack of Transportation (Non-Medical): No  Physical Activity: Insufficiently Active (09/02/2021)   Exercise Vital Sign    Days of Exercise per Week: 2 days    Minutes of Exercise per Session: 30 min  Stress: No Stress Concern Present (09/02/2021)   [employer] of Occupational Health - Occupational Stress Questionnaire    Feeling of Stress : Not at all  Social Connections: Socially Isolated (09/02/2021)   Social Connection and Isolation Panel [NHANES]    Frequency of Communication with Friends and Family: More than three times a week    Frequency of Social Gatherings with Friends and Family: More than three times a week    Attends Religious Services: Never    Database administrator or Organizations: No    Attends Banker Meetings: Never    Marital Status: Never married    Tobacco Counseling Counseling given: Not Answered   Clinical Intake:                 Diabetic?No         Activities of Daily Living     No data to display          Patient Care Team: Mechele Claude, MD as PCP - General (Family Medicine) Laqueta Linden, MD (Inactive) as PCP - Cardiology (Cardiology) Ernesto Rutherford, MD as Consulting Physician (Ophthalmology) Iva Boop, MD as Consulting Physician (Gastroenterology)  Indicate any recent Medical Services you may have received from other than [facility] providers in the past year (date may be approximate).     Assessment:   This is a routine wellness examination for  [place].  Hearing/Vision screen No results found.  Dietary issues and exercise activities discussed:     Goals Addressed   None    Depression Screen    03/26/2022    8:25 AM 03/26/2022    8:17 AM 09/26/2021    8:46 AM 09/26/2021    8:34 AM  09/02/2021    8:18 AM 06/27/2021    9:14 AM 03/27/2021    9:59 AM  PHQ 2/9 Scores  PHQ - 2 Score 0 0 0 0 0 0 0  PHQ- 9 Score 3  2        Fall Risk    03/26/2022    8:17 AM 09/26/2021    8:34 AM 09/02/2021    8:21 AM 06/27/2021    9:14 AM 03/27/2021    9:59 AM  Fall Risk   Falls in the past year? 0 0 0 0 0  Number falls in past yr:   0    Injury with Fall?   0    Risk for fall due to :   Other (Comment)    Risk for fall due to: Comment   hx of TIA    Follow up   Education provided;Falls prevention discussed      FALL RISK PREVENTION PERTAINING TO THE HOME:  Any stairs in or around the home? {YES/NO:21197} If so, are there any without handrails? {YES/NO:21197} Home free of loose throw rugs in walkways, pet beds, electrical cords, etc? {YES/NO:21197} Adequate lighting in your home to reduce risk of falls? {YES/NO:21197}  ASSISTIVE DEVICES UTILIZED TO PREVENT FALLS:  Life alert? {YES/NO:21197} Use of a cane, walker or w/c? {YES/NO:21197} Grab bars in the bathroom? {YES/NO:21197} Shower chair or bench in shower? {YES/NO:21197} Elevated toilet seat or a handicapped toilet? {YES/NO:21197}  TIMED UP AND GO:  Was the test performed? {YES/NO:21197}.  Length of time to ambulate 10 feet: *** sec.   {Appearance of WNUU:7253664}  Cognitive Function:    10/26/2017    3:36 PM 07/02/2016    2:57 PM 03/09/2015    9:53 AM  MMSE - Mini Mental State Exam  Orientation to time 5 5 5   Orientation to Place 5  5  Registration 3 3 3   Attention/ Calculation 5 5 5   Recall 3 2 3   Language- name 2 objects 2 2 2   Language- repeat 1 1 1   Language- follow 3 step command 3 3 3   Language- read & follow direction 1 1 1   Write a sentence 1 1 1   Copy design 1 0 0   Total score 30  29        09/02/2021    8:25 AM 08/31/2020    8:30 AM 03/10/2019    2:12 PM  6CIT Screen  What Year? 0 points 0 points 0 points  What month? 0 points 0 points 0 points  What time? 0 points 0 points 0 points  Count back from 20 0 points 0 points 0 points  Months in reverse 0 points 0 points 0 points  Repeat phrase 0 points 2 points 0 points  Total Score 0 points 2 points 0 points    Immunizations Immunization History  Administered Date(s) Administered   Fluad Quad(high Dose 65+) 06/03/2019, 05/22/2020, 05/30/2021, 05/22/2022   Influenza, High Dose Seasonal PF 06/16/2017, 06/04/2018   Influenza,inj,Quad PF,6+ Mos 06/10/2013, 06/05/2014, 06/05/2015, 05/26/2016   PFIZER(Purple Top)SARS-COV-2 Vaccination 11/30/2019, 12/23/2019, 07/23/2020   Pneumococcal Conjugate-13 12/26/2014   Pneumococcal Polysaccharide-23 11/22/2012   Td 09/30/2007   Tdap 12/23/2017   Zoster Recombinat (Shingrix) 03/27/2021, 09/26/2021    TDAP status: Up to date  Flu Vaccine status: Up to date  Pneumococcal vaccine status: Up to date  Covid-19 vaccine status: Information provided on how to obtain vaccines.   Qualifies for Shingles Vaccine? Yes   Zostavax completed No   Shingrix  Completed?: Yes  Screening Tests Health Maintenance  Topic Date Due   COVID-19 Vaccine (4 - 2023-24 season) 04/25/2022   DEXA SCAN  05/21/2022   Medicare Annual Wellness (AWV)  09/02/2022   MAMMOGRAM  05/15/2023   DTaP/Tdap/Td (3 - Td or Tdap) 12/24/2027   Pneumonia Vaccine 79+ Years old  Completed   INFLUENZA VACCINE  Completed   Hepatitis C Screening  Completed   Zoster Vaccines- Shingrix  Completed   HPV VACCINES  Aged Out   COLONOSCOPY (Pts 45-72yrs Insurance coverage will need to be confirmed)  Discontinued    Health Maintenance  Health Maintenance Due  Topic Date Due   COVID-19 Vaccine (4 - 2023-24 season) 04/25/2022   DEXA SCAN  05/21/2022   Medicare Annual Wellness (AWV)  09/02/2022     Colorectal cancer screening: No longer required.   Mammogram status: Completed 05/14/22. Repeat every year  Bone Density status: Completed 05/21/20. Results reflect: Bone density results: OSTEOPENIA. Repeat every 2 years.  Lung Cancer Screening: (Low Dose CT Chest recommended if Age 21-80 years, 30 pack-year currently smoking OR have quit w/in 15years.) does not qualify.   Lung Cancer Screening Referral: n/a   Additional Screening:  Hepatitis C Screening: does qualify; Completed 08/29/20  Vision Screening: Recommended annual ophthalmology exams for early detection of glaucoma and other disorders of the eye. Is the patient up to date with their annual eye exam?  {YES/NO:21197} Who is the provider or what is the name of the office in which the patient attends annual eye exams? *** If pt is not established with a provider, would they like to be referred to a provider to establish care? {YES/NO:21197}.   Dental Screening: Recommended annual dental exams for proper oral hygiene  Community Resource Referral / Chronic Care Management: CRR required this visit?  {YES/NO:21197}  CCM required this visit?  {YES/NO:21197}     Plan:     I have personally reviewed and noted the following in the patient's chart:   Medical and social history Use of alcohol, tobacco or illicit drugs  Current medications and supplements including opioid prescriptions. {Opioid Prescriptions:[phone]} Functional ability and status Nutritional status Physical activity Advanced directives List of other physicians Hospitalizations, surgeries, and ER visits in previous 12 months Vitals Screenings to include cognitive, depression, and falls Referrals and appointments  In addition, I have reviewed and discussed with patient certain preventive protocols, quality metrics, and best practice recommendations. A written personalized care plan for preventive services as well as general preventive health  recommendations were provided to patient.     Kandis Fantasia [place], [place]   10/28/4654   Nurse Notes: ***

## 2022-09-03 ENCOUNTER — Ambulatory Visit (INDEPENDENT_AMBULATORY_CARE_PROVIDER_SITE_OTHER): Payer: Medicare Other

## 2022-09-03 VITALS — Ht 61.0 in | Wt 140.0 lb

## 2022-09-03 DIAGNOSIS — Z0000 Encounter for general adult medical examination without abnormal findings: Secondary | ICD-10-CM | POA: Diagnosis not present

## 2022-09-25 ENCOUNTER — Ambulatory Visit (INDEPENDENT_AMBULATORY_CARE_PROVIDER_SITE_OTHER): Payer: Medicare Other | Admitting: Family Medicine

## 2022-09-25 ENCOUNTER — Encounter: Payer: Self-pay | Admitting: Family Medicine

## 2022-09-25 VITALS — BP 133/69 | HR 81 | Temp 98.4°F | Ht 61.0 in | Wt 142.6 lb

## 2022-09-25 DIAGNOSIS — I10 Essential (primary) hypertension: Secondary | ICD-10-CM

## 2022-09-25 DIAGNOSIS — I6930 Unspecified sequelae of cerebral infarction: Secondary | ICD-10-CM

## 2022-09-25 DIAGNOSIS — E039 Hypothyroidism, unspecified: Secondary | ICD-10-CM | POA: Diagnosis not present

## 2022-09-25 DIAGNOSIS — E782 Mixed hyperlipidemia: Secondary | ICD-10-CM

## 2022-09-25 MED ORDER — PANTOPRAZOLE SODIUM 40 MG PO TBEC: 40.0000 mg | DELAYED_RELEASE_TABLET | Freq: Every day | ORAL | 3 refills | Status: DC

## 2022-09-25 NOTE — Progress Notes (Signed)
Stroke follow up - still weak  Subjective:  Patient ID: [name], female    DOB: [birth year]  Age: 77 y.o. MRN: [id number]  CC: Medical Management of Chronic Issues   HPI [name] presents for stroke follow up. Still right side is weak. Gets around, but slow.    follow-up on  thyroid. The patient has a history of hypothyroidism for many years. It has been stable recently. Pt. denies any change in  voice, loss of hair, heat or cold intolerance. Energy level has been adequate to good. Patient denies constipation and diarrhea. No myxedema. Medication is as noted below. Verified that pt is taking it daily on an empty stomach. Well tolerated.  Patient in for follow-up of GERD. Currently asymptomatic taking  PPI daily. There is no chest pain or heartburn. No hematemesis and no melena. No dysphagia or choking. Onset is remote. Progression is stable. Complicating factors, none.      09/25/2022    8:27 AM 09/03/2022   11:21 AM 03/26/2022    8:25 AM  Depression screen PHQ 2/9  Decreased Interest 0 0 0  Down, Depressed, Hopeless 0 0 0  PHQ - 2 Score 0 0 0  Altered sleeping   0  Tired, decreased energy   2  Change in appetite   1  Feeling bad or failure about yourself    0  Trouble concentrating   0  Moving slowly or fidgety/restless   0  Suicidal thoughts   0  PHQ-9 Score   3  Difficult doing work/chores   Not difficult at all    History Teriah has a past medical history of Allergy, Asthma, Colon polyps, GERD (gastroesophageal reflux disease), Hyperlipidemia, Hypertension, Menopausal state, Osteopenia, Rhinitis, Thyroid disease, and Vitamin D deficiency disease.   She has a past surgical history that includes Tonsillectomy; Cholecystectomy (2005); and Colonoscopy (2099).   Her family history includes AAA (abdominal aortic aneurysm) in her mother; Asthma in her brother; Colon cancer (age of onset: 46) in her mother; Colon polyps in her mother; Gout in her sister; Heart attack in her  father; Heart disease in her father; Hyperlipidemia in her mother; Hypertension in her brother, mother, and sister; Osteoporosis in her mother; Thyroid disease in her mother.She reports that she has never smoked. She has never used smokeless tobacco. She reports that she does not drink alcohol and does not use drugs.    ROS Review of Systems  Constitutional: Negative.   HENT: Negative.    Eyes:  Negative for visual disturbance.  Respiratory:  Negative for shortness of breath.   Cardiovascular:  Negative for chest pain.  Gastrointestinal:  Negative for abdominal pain.  Musculoskeletal:  Negative for arthralgias.    Objective:  BP 133/69   Pulse 81   Temp 98.4 F (36.9 C)   Ht 5\' 1"  (1.549 m)   Wt 142 lb 9.6 oz (64.7 kg)   SpO2 98%   BMI 26.94 kg/m   BP Readings from Last 3 Encounters:  09/25/22 133/69  03/26/22 133/67  02/10/22 (!) 154/91    Wt Readings from Last 3 Encounters:  09/25/22 142 lb 9.6 oz (64.7 kg)  09/03/22 140 lb (63.5 kg)  03/26/22 137 lb 9.6 oz (62.4 kg)     Physical Exam Constitutional:      General: She is not in acute distress.    Appearance: She is well-developed.  Cardiovascular:     Rate and Rhythm: Normal rate and regular rhythm.  Pulmonary:  Breath sounds: Normal breath sounds.  Musculoskeletal:        General: Normal range of motion.  Skin:    General: Skin is warm and dry.  Neurological:     Mental Status: She is alert and oriented to person, place, and time.       Assessment & Plan:   Liseth was seen today for medical management of chronic issues.  Diagnoses and all orders for this visit:  Hypothyroidism, unspecified type -     TSH + free T4  Mixed hyperlipidemia -     Lipid panel  Essential hypertension -     CBC with Differential/Platelet -     CMP14+EGFR  Late effect of cerebrovascular accident (CVA)  Other orders -     pantoprazole (PROTONIX) 40 MG tablet; Take 1 tablet (40 mg total) by mouth daily. For  stomach       I am having [name] "[name]" maintain her multivitamin with minerals, Vitamin D3, albuterol, aspirin EC, alendronate, amLODipine, lisinopril, atorvastatin, fluticasone-salmeterol, levothyroxine, fluticasone, and pantoprazole.  Allergies as of 09/25/2022       Reactions   Evista [raloxifene Hcl] Other (See Comments)   Bone pain, couldn't put weight on right leg/foot   Fosamax [alendronate Sodium]    Bone pain   Red Dye    Sulfa Antibiotics         Medication List        Accurate as of September 25, 2022  9:11 AM. If you have any questions, ask your nurse or doctor.          albuterol 108 (90 Base) MCG/ACT inhaler Commonly known as: VENTOLIN HFA Inhale 2 puffs into the lungs every 6 (six) hours as needed for wheezing.   alendronate 70 MG tablet Commonly known as: FOSAMAX Take 1 tablet (70 mg total) by mouth every 7 (seven) days. Take with a full glass of water on an empty stomach.   amLODipine 5 MG tablet Commonly known as: NORVASC Take 1 tablet (5 mg total) by mouth daily.   aspirin EC 81 MG tablet Take 81 mg by mouth daily. Swallow whole.   atorvastatin 40 MG tablet Commonly known as: LIPITOR Take 1 tablet (40 mg total) by mouth daily.   fluticasone 50 MCG/ACT nasal spray Commonly known as: FLONASE Use 2 spray(s) in each nostril once daily   fluticasone-salmeterol 100-50 MCG/ACT Aepb Commonly known as: ADVAIR Inhale 1 puff into the lungs 2 (two) times daily.   levothyroxine 50 MCG tablet Commonly known as: SYNTHROID Take 1 tablet by mouth once daily   lisinopril 20 MG tablet Commonly known as: ZESTRIL Take 1 tablet (20 mg total) by mouth daily.   multivitamin with minerals Tabs tablet Take 1 tablet by mouth daily.   pantoprazole 40 MG tablet Commonly known as: PROTONIX Take 1 tablet (40 mg total) by mouth daily. For stomach   Vitamin D3 75 MCG (3000 UT) Tabs Take 2,000 Units by mouth daily.         Follow-up: Return  in about 6 months (around 03/26/2023).  [name], M.D.

## 2022-09-26 LAB — CBC WITH DIFFERENTIAL/PLATELET
Basophils Absolute: 0.1 10*3/uL (ref 0.0–0.2)
Basos: 1 %
EOS (ABSOLUTE): 0.3 10*3/uL (ref 0.0–0.4)
Eos: 5 %
Hematocrit: 40 % (ref 34.0–46.6)
Hemoglobin: 13.1 g/dL (ref 11.1–15.9)
Immature Grans (Abs): 0 10*3/uL (ref 0.0–0.1)
Immature Granulocytes: 0 %
Lymphocytes Absolute: 0.7 10*3/uL (ref 0.7–3.1)
Lymphs: 11 %
MCH: 30.3 pg (ref 26.6–33.0)
MCHC: 32.8 g/dL (ref 31.5–35.7)
MCV: 92 fL (ref 79–97)
Monocytes Absolute: 0.5 10*3/uL (ref 0.1–0.9)
Monocytes: 8 %
Neutrophils Absolute: 4.7 10*3/uL (ref 1.4–7.0)
Neutrophils: 75 %
Platelets: 326 10*3/uL (ref 150–450)
RBC: 4.33 x10E6/uL (ref 3.77–5.28)
RDW: 12.3 % (ref 11.7–15.4)
WBC: 6.3 10*3/uL (ref 3.4–10.8)

## 2022-09-26 LAB — LIPID PANEL
Chol/HDL Ratio: 2.1 ratio (ref 0.0–4.4)
Cholesterol, Total: 148 mg/dL (ref 100–199)
HDL: 69 mg/dL (ref >39)
LDL Chol Calc (NIH): 63 mg/dL (ref 0–99)
Triglycerides: 88 mg/dL (ref 0–149)
VLDL Cholesterol Cal: 16 mg/dL (ref 5–40)

## 2022-09-26 LAB — CMP14+EGFR
ALT: 14 IU/L (ref 0–32)
AST: 19 IU/L (ref 0–40)
Albumin/Globulin Ratio: 1.4 (ref 1.2–2.2)
Albumin: 4.1 g/dL (ref 3.8–4.8)
Alkaline Phosphatase: 84 IU/L (ref 44–121)
BUN/Creatinine Ratio: 9 — ABNORMAL LOW (ref 12–28)
BUN: 8 mg/dL (ref 8–27)
Bilirubin Total: 0.9 mg/dL (ref 0.0–1.2)
CO2: 20 mmol/L (ref 20–29)
Calcium: 10.5 mg/dL — ABNORMAL HIGH (ref 8.7–10.3)
Chloride: 102 mmol/L (ref 96–106)
Creatinine, Ser: 0.92 mg/dL (ref 0.57–1.00)
Globulin, Total: 3 g/dL (ref 1.5–4.5)
Glucose: 94 mg/dL (ref 70–99)
Potassium: 5.1 mmol/L (ref 3.5–5.2)
Sodium: 139 mmol/L (ref 134–144)
Total Protein: 7.1 g/dL (ref 6.0–8.5)
eGFR: 64 mL/min/{1.73_m2} (ref >59)

## 2022-09-26 LAB — TSH+FREE T4
Free T4: 1.38 ng/dL (ref 0.82–1.77)
TSH: 4.55 u[IU]/mL — ABNORMAL HIGH (ref 0.450–4.500)

## 2022-09-28 ENCOUNTER — Other Ambulatory Visit: Payer: Self-pay | Admitting: Family Medicine

## 2022-09-28 DIAGNOSIS — E039 Hypothyroidism, unspecified: Secondary | ICD-10-CM

## 2022-09-28 MED ORDER — LEVOTHYROXINE SODIUM 75 MCG PO TABS: 75.0000 ug | ORAL_TABLET | Freq: Every day | ORAL | 1 refills | Status: DC

## 2022-09-29 ENCOUNTER — Other Ambulatory Visit: Payer: Self-pay | Admitting: *Deleted

## 2022-09-29 DIAGNOSIS — E039 Hypothyroidism, unspecified: Secondary | ICD-10-CM

## 2022-11-25 ENCOUNTER — Other Ambulatory Visit (INDEPENDENT_AMBULATORY_CARE_PROVIDER_SITE_OTHER): Payer: Medicare Other

## 2022-11-25 DIAGNOSIS — E039 Hypothyroidism, unspecified: Secondary | ICD-10-CM

## 2022-11-26 LAB — TSH+FREE T4
Free T4: 1.83 ng/dL — ABNORMAL HIGH (ref 0.82–1.77)
TSH: 0.164 u[IU]/mL — ABNORMAL LOW (ref 0.450–4.500)

## 2022-12-01 ENCOUNTER — Ambulatory Visit (INDEPENDENT_AMBULATORY_CARE_PROVIDER_SITE_OTHER): Payer: Medicare Other | Admitting: Family Medicine

## 2022-12-01 ENCOUNTER — Encounter: Payer: Self-pay | Admitting: Family Medicine

## 2022-12-01 VITALS — BP 134/70 | HR 90 | Temp 98.0°F | Ht 61.0 in | Wt 143.2 lb

## 2022-12-01 DIAGNOSIS — E039 Hypothyroidism, unspecified: Secondary | ICD-10-CM | POA: Diagnosis not present

## 2022-12-01 DIAGNOSIS — I10 Essential (primary) hypertension: Secondary | ICD-10-CM | POA: Diagnosis not present

## 2022-12-01 NOTE — Progress Notes (Signed)
Subjective:  Patient ID: [name], female    DOB: 07/09/1946  Age: 77 y.o. MRN: [id number]  CC: Medical Management of Chronic Issues   HPI THEORY LUCIUS presents for recheck of thyroid. Feels much better at the 75 mcg dose. Denies adverse effects including palpitations, hot flashes, diarrhea, etc. Had blood work last week..    presents for  follow-up of hypertension. Patient has no history of headache chest pain or shortness of breath or recent cough. Patient also denies symptoms of TIA such as focal numbness or weakness. Patient denies side effects from medication. States taking it regularly.      12/01/2022   10:11 AM 09/25/2022    8:27 AM 09/03/2022   11:21 AM  Depression screen PHQ 2/9  Decreased Interest 0 0 0  Down, Depressed, Hopeless 0 0 0  PHQ - 2 Score 0 0 0    History [name] has a past medical history of Allergy, Asthma, Colon polyps, GERD (gastroesophageal reflux disease), Hyperlipidemia, Hypertension, Menopausal state, Osteopenia, Rhinitis, Thyroid disease, and Vitamin D deficiency disease.   She has a past surgical history that includes Tonsillectomy; Cholecystectomy (2005); and Colonoscopy (2099).   Her family history includes AAA (abdominal aortic aneurysm) in her mother; Asthma in her brother; Colon cancer (age of onset: 20) in her mother; Colon polyps in her mother; Gout in her sister; Heart attack in her father; Heart disease in her father; Hyperlipidemia in her mother; Hypertension in her brother, mother, and sister; Osteoporosis in her mother; Thyroid disease in her mother.She reports that she has never smoked. She has never used smokeless tobacco. She reports that she does not drink alcohol and does not use drugs.    ROS Review of Systems  Constitutional: Negative.   HENT: Negative.    Eyes:  Negative for visual disturbance.  Respiratory:  Negative for shortness of breath.   Cardiovascular:  Negative for chest pain.  Gastrointestinal:  Negative for  abdominal pain.  Musculoskeletal:  Negative for arthralgias.    Objective:  BP 134/70   Pulse 90   Temp 98 F (36.7 C)   Ht 5\' 1"  (1.549 m)   Wt 143 lb 3.2 oz (65 kg)   SpO2 98%   BMI 27.06 kg/m   BP Readings from Last 3 Encounters:  12/01/22 134/70  09/25/22 133/69  03/26/22 133/67    Wt Readings from Last 3 Encounters:  12/01/22 143 lb 3.2 oz (65 kg)  09/25/22 142 lb 9.6 oz (64.7 kg)  09/03/22 140 lb (63.5 kg)     Physical Exam Constitutional:      General: She is not in acute distress.    Appearance: She is well-developed.  Cardiovascular:     Rate and Rhythm: Normal rate and regular rhythm.  Pulmonary:     Breath sounds: Normal breath sounds.  Musculoskeletal:        General: Normal range of motion.  Skin:    General: Skin is warm and dry.  Neurological:     Mental Status: She is alert and oriented to person, place, and time.       Assessment & Plan:   [name] was seen today for medical management of chronic issues.  Diagnoses and all orders for this visit:  Hypothyroidism, unspecified type  Essential hypertension       I am having [name] "[name]" maintain her multivitamin with minerals, Vitamin D3, albuterol, aspirin EC, alendronate, amLODipine, lisinopril, atorvastatin, fluticasone-salmeterol, fluticasone, pantoprazole, and levothyroxine.  Allergies as  of 12/01/2022       Reactions   Evista [raloxifene Hcl] Other (See Comments)   Bone pain, couldn't put weight on right leg/foot   Fosamax [alendronate Sodium]    Bone pain   Red Dye    Sulfa Antibiotics         Medication List        Accurate as of December 01, 2022 10:46 AM. If you have any questions, ask your nurse or doctor.          albuterol 108 (90 Base) MCG/ACT inhaler Commonly known as: VENTOLIN HFA Inhale 2 puffs into the lungs every 6 (six) hours as needed for wheezing.   alendronate 70 MG tablet Commonly known as: FOSAMAX Take 1 tablet (70 mg total) by mouth  every 7 (seven) days. Take with a full glass of water on an empty stomach.   amLODipine 5 MG tablet Commonly known as: NORVASC Take 1 tablet (5 mg total) by mouth daily.   aspirin EC 81 MG tablet Take 81 mg by mouth daily. Swallow whole.   atorvastatin 40 MG tablet Commonly known as: LIPITOR Take 1 tablet (40 mg total) by mouth daily.   fluticasone 50 MCG/ACT nasal spray Commonly known as: FLONASE Use 2 spray(s) in each nostril once daily   fluticasone-salmeterol 100-50 MCG/ACT Aepb Commonly known as: ADVAIR Inhale 1 puff into the lungs 2 (two) times daily.   levothyroxine 75 MCG tablet Commonly known as: SYNTHROID Take 1 tablet (75 mcg total) by mouth daily.   lisinopril 20 MG tablet Commonly known as: ZESTRIL Take 1 tablet (20 mg total) by mouth daily.   multivitamin with minerals Tabs tablet Take 1 tablet by mouth daily.   pantoprazole 40 MG tablet Commonly known as: PROTONIX Take 1 tablet (40 mg total) by mouth daily. For stomach   Vitamin D3 75 MCG (3000 UT) Tabs Generic drug: Cholecalciferol Take 2,000 Units by mouth daily.         Follow-up: Return in about 6 months (around 06/02/2023) for hypertension, Hypothyroidism.  Mechele Claude, M.D.

## 2023-03-15 ENCOUNTER — Other Ambulatory Visit: Payer: Self-pay | Admitting: Family Medicine

## 2023-03-15 DIAGNOSIS — E039 Hypothyroidism, unspecified: Secondary | ICD-10-CM

## 2023-03-17 NOTE — Telephone Encounter (Signed)
Patient has appointment 08/05 1 refill given.

## 2023-03-30 ENCOUNTER — Encounter: Payer: Self-pay | Admitting: Family Medicine

## 2023-03-30 ENCOUNTER — Other Ambulatory Visit: Payer: Self-pay | Admitting: Family Medicine

## 2023-03-30 ENCOUNTER — Inpatient Hospital Stay: Admission: RE | Admit: 2023-03-30 | Payer: Medicare Other | Source: Ambulatory Visit

## 2023-03-30 ENCOUNTER — Ambulatory Visit (INDEPENDENT_AMBULATORY_CARE_PROVIDER_SITE_OTHER): Payer: Medicare Other | Admitting: Family Medicine

## 2023-03-30 VITALS — BP 142/72 | HR 99 | Temp 98.2°F | Ht 61.0 in | Wt 144.2 lb

## 2023-03-30 DIAGNOSIS — E039 Hypothyroidism, unspecified: Secondary | ICD-10-CM | POA: Diagnosis not present

## 2023-03-30 DIAGNOSIS — N6321 Unspecified lump in the left breast, upper outer quadrant: Secondary | ICD-10-CM

## 2023-03-30 DIAGNOSIS — E782 Mixed hyperlipidemia: Secondary | ICD-10-CM

## 2023-03-30 DIAGNOSIS — J4530 Mild persistent asthma, uncomplicated: Secondary | ICD-10-CM | POA: Diagnosis not present

## 2023-03-30 DIAGNOSIS — Z1231 Encounter for screening mammogram for malignant neoplasm of breast: Secondary | ICD-10-CM

## 2023-03-30 DIAGNOSIS — J4540 Moderate persistent asthma, uncomplicated: Secondary | ICD-10-CM | POA: Diagnosis not present

## 2023-03-30 DIAGNOSIS — I10 Essential (primary) hypertension: Secondary | ICD-10-CM

## 2023-03-30 LAB — LIPID PANEL

## 2023-03-30 LAB — CMP14+EGFR

## 2023-03-30 LAB — CBC WITH DIFFERENTIAL/PLATELET
Basophils Absolute: 0 10*3/uL (ref 0.0–0.2)
Basos: 1 %
EOS (ABSOLUTE): 0.2 10*3/uL (ref 0.0–0.4)
Eos: 3 %
Hematocrit: 39 % (ref 34.0–46.6)
Hemoglobin: 12.7 g/dL (ref 11.1–15.9)
Immature Grans (Abs): 0 10*3/uL (ref 0.0–0.1)
Immature Granulocytes: 0 %
Lymphocytes Absolute: 0.8 10*3/uL (ref 0.7–3.1)
Lymphs: 14 %
MCH: 29 pg (ref 26.6–33.0)
MCHC: 32.6 g/dL (ref 31.5–35.7)
MCV: 89 fL (ref 79–97)
Monocytes Absolute: 0.4 10*3/uL (ref 0.1–0.9)
Monocytes: 7 %
Neutrophils Absolute: 4.6 10*3/uL (ref 1.4–7.0)
Neutrophils: 75 %
Platelets: 323 10*3/uL (ref 150–450)
RBC: 4.38 x10E6/uL (ref 3.77–5.28)
RDW: 12.9 % (ref 11.7–15.4)
WBC: 6.1 10*3/uL (ref 3.4–10.8)

## 2023-03-30 LAB — TSH+FREE T4

## 2023-03-30 MED ORDER — LEVOTHYROXINE SODIUM 75 MCG PO TABS: 75.0000 ug | ORAL_TABLET | Freq: Every day | ORAL | 0 refills | Status: DC

## 2023-03-30 MED ORDER — FLUTICASONE-SALMETEROL 100-50 MCG/ACT IN AEPB: 1.0000 | INHALATION_SPRAY | Freq: Two times a day (BID) | RESPIRATORY_TRACT | 3 refills | Status: DC

## 2023-03-30 MED ORDER — ALENDRONATE SODIUM 70 MG PO TABS: 70.0000 mg | ORAL_TABLET | ORAL | 3 refills | Status: DC

## 2023-03-30 MED ORDER — ATORVASTATIN CALCIUM 40 MG PO TABS: 40.0000 mg | ORAL_TABLET | Freq: Every day | ORAL | 3 refills | Status: DC

## 2023-03-30 MED ORDER — AMLODIPINE BESYLATE 5 MG PO TABS: 5.0000 mg | ORAL_TABLET | Freq: Every day | ORAL | 3 refills | Status: DC

## 2023-03-30 MED ORDER — FLUTICASONE PROPIONATE 50 MCG/ACT NA SUSP
NASAL | 3 refills | Status: DC
Start: 2023-03-30 — End: 2024-01-29

## 2023-03-30 MED ORDER — LISINOPRIL 20 MG PO TABS
20.0000 mg | ORAL_TABLET | Freq: Every day | ORAL | 3 refills | Status: DC
Start: 2023-03-30 — End: 2023-04-30

## 2023-03-30 NOTE — Progress Notes (Signed)
Subjective:  Patient ID: [name], female    DOB: [birth year]  Age: 77 y.o. MRN: [id number]  CC: Medical Management of Chronic Issues   HPI [name] presents for  follow-up of hypertension. Patient has no history of headache chest pain or shortness of breath or recent cough. Patient also denies symptoms of TIA such as focal numbness or weakness. Patient denies side effects from medication. States taking it regularly. Bit of breathing problems  Hot weather has made for a little bit of dyspnea due to being in and out a lot.   History [name] has a past medical history of Allergy, Asthma, Colon polyps, GERD (gastroesophageal reflux disease), Hyperlipidemia, Hypertension, Menopausal state, Osteopenia, Rhinitis, Thyroid disease, and Vitamin D deficiency disease.   She has a past surgical history that includes Tonsillectomy; Cholecystectomy (2005); and Colonoscopy (2099).   Her family history includes AAA (abdominal aortic aneurysm) in her mother; Asthma in her brother; Colon cancer (age of onset: 43) in her mother; Colon polyps in her mother; Gout in her sister; Heart attack in her father; Heart disease in her father; Hyperlipidemia in her mother; Hypertension in her brother, mother, and sister; Osteoporosis in her mother; Thyroid disease in her mother.She reports that she has never smoked. She has never used smokeless tobacco. She reports that she does not drink alcohol and does not use drugs.  Current Outpatient Medications on File Prior to Visit  Medication Sig Dispense Refill   albuterol (VENTOLIN HFA) 108 (90 Base) MCG/ACT inhaler Inhale 2 puffs into the lungs every 6 (six) hours as needed for wheezing. 18 g 11   aspirin EC 81 MG tablet Take 81 mg by mouth daily. Swallow whole.     Cholecalciferol (VITAMIN D3) 3000 UNITS TABS Take 2,000 Units by mouth daily.     Multiple Vitamin (MULTIVITAMIN WITH MINERALS) TABS Take 1 tablet by mouth daily.     pantoprazole (PROTONIX) 40 MG tablet  Take 1 tablet (40 mg total) by mouth daily. For stomach 90 tablet 3   No current facility-administered medications on file prior to visit.    ROS Review of Systems  Constitutional: Negative.   HENT: Negative.    Eyes:  Negative for visual disturbance.  Respiratory:  Negative for shortness of breath.   Cardiovascular:  Negative for chest pain.  Gastrointestinal:  Negative for abdominal pain.  Musculoskeletal:  Negative for arthralgias.    Objective:  BP (!) 142/72   Pulse 99   Temp 98.2 F (36.8 C)   Ht 5\' 1"  (1.549 m)   Wt 144 lb 3.2 oz (65.4 kg)   SpO2 97%   BMI 27.25 kg/m   BP Readings from Last 3 Encounters:  03/30/23 (!) 142/72  12/01/22 134/70  09/25/22 133/69    Wt Readings from Last 3 Encounters:  03/30/23 144 lb 3.2 oz (65.4 kg)  12/01/22 143 lb 3.2 oz (65 kg)  09/25/22 142 lb 9.6 oz (64.7 kg)     Physical Exam Constitutional:      General: She is not in acute distress.    Appearance: She is well-developed.  Cardiovascular:     Rate and Rhythm: Normal rate and regular rhythm.  Pulmonary:     Breath sounds: Normal breath sounds.  Chest:       Comments: Hard lump attached to chest wall at periphery of left breast, superiorly Musculoskeletal:        General: Normal range of motion.  Skin:    General: Skin is warm  and dry.  Neurological:     Mental Status: She is alert and oriented to person, place, and time.       Assessment & Plan:   Jennifer "[name]" was seen today for medical management of chronic issues.  Diagnoses and all orders for this visit:  Hypothyroidism, unspecified type -     TSH + free T4 -     levothyroxine (SYNTHROID) 75 MCG tablet; Take 1 tablet (75 mcg total) by mouth daily.  Essential hypertension -     CBC with Differential/Platelet -     CMP14+EGFR -     amLODipine (NORVASC) 5 MG tablet; Take 1 tablet (5 mg total) by mouth daily. -     lisinopril (ZESTRIL) 20 MG tablet; Take 1 tablet (20 mg total) by mouth  daily.  Mixed hyperlipidemia -     Lipid panel  Mild persistent chronic asthma without complication  Asthma, chronic, moderate persistent, uncomplicated -     fluticasone (FLONASE) 50 MCG/ACT nasal spray; Use 2 spray(s) in each nostril once daily  Mass of upper outer quadrant of left breast -     US BREAST COMPLETE UNI LEFT INC AXILLA; Future  Other orders -     atorvastatin (LIPITOR) 40 MG tablet; Take 1 tablet (40 mg total) by mouth daily. -     fluticasone-salmeterol (ADVAIR) 100-50 MCG/ACT AEPB; Inhale 1 puff into the lungs 2 (two) times daily. -     alendronate (FOSAMAX) 70 MG tablet; Take 1 tablet (70 mg total) by mouth every 7 (seven) days. Take with a full glass of water on an empty stomach.   Allergies as of 03/30/2023       Reactions   Evista [raloxifene Hcl] Other (See Comments)   Bone pain, couldn't put weight on right leg/foot   Fosamax [alendronate Sodium]    Bone pain   Red Dye    Sulfa Antibiotics         Medication List        Accurate as of March 30, 2023  9:00 AM. If you have any questions, ask your nurse or doctor.          albuterol 108 (90 Base) MCG/ACT inhaler Commonly known as: VENTOLIN HFA Inhale 2 puffs into the lungs every 6 (six) hours as needed for wheezing.   alendronate 70 MG tablet Commonly known as: FOSAMAX Take 1 tablet (70 mg total) by mouth every 7 (seven) days. Take with a full glass of water on an empty stomach.   amLODipine 5 MG tablet Commonly known as: NORVASC Take 1 tablet (5 mg total) by mouth daily.   aspirin EC 81 MG tablet Take 81 mg by mouth daily. Swallow whole.   atorvastatin 40 MG tablet Commonly known as: LIPITOR Take 1 tablet (40 mg total) by mouth daily.   fluticasone 50 MCG/ACT nasal spray Commonly known as: FLONASE Use 2 spray(s) in each nostril once daily   fluticasone-salmeterol 100-50 MCG/ACT Aepb Commonly known as: ADVAIR Inhale 1 puff into the lungs 2 (two) times daily.   levothyroxine 75  MCG tablet Commonly known as: SYNTHROID Take 1 tablet (75 mcg total) by mouth daily.   lisinopril 20 MG tablet Commonly known as: ZESTRIL Take 1 tablet (20 mg total) by mouth daily.   multivitamin with minerals Tabs tablet Take 1 tablet by mouth daily.   pantoprazole 40 MG tablet Commonly known as: PROTONIX Take 1 tablet (40 mg total) by mouth daily. For stomach   Vitamin D3 75  MCG (3000 UT) Tabs Take 2,000 Units by mouth daily.        Meds ordered this encounter  Medications   amLODipine (NORVASC) 5 MG tablet    Sig: Take 1 tablet (5 mg total) by mouth daily.    Dispense:  90 tablet    Refill:  3   atorvastatin (LIPITOR) 40 MG tablet    Sig: Take 1 tablet (40 mg total) by mouth daily.    Dispense:  90 tablet    Refill:  3   fluticasone-salmeterol (ADVAIR) 100-50 MCG/ACT AEPB    Sig: Inhale 1 puff into the lungs 2 (two) times daily.    Dispense:  180 each    Refill:  3   levothyroxine (SYNTHROID) 75 MCG tablet    Sig: Take 1 tablet (75 mcg total) by mouth daily.    Dispense:  90 tablet    Refill:  0   lisinopril (ZESTRIL) 20 MG tablet    Sig: Take 1 tablet (20 mg total) by mouth daily.    Dispense:  90 tablet    Refill:  3   alendronate (FOSAMAX) 70 MG tablet    Sig: Take 1 tablet (70 mg total) by mouth every 7 (seven) days. Take with a full glass of water on an empty stomach.    Dispense:  13 tablet    Refill:  3   fluticasone (FLONASE) 50 MCG/ACT nasal spray    Sig: Use 2 spray(s) in each nostril once daily    Dispense:  48 g    Refill:  3      Follow-up: Return in about 1 month (around 04/30/2023). For review of breast lump. Six months for routine care  Mechele Claude, M.D.

## 2023-03-31 NOTE — Progress Notes (Signed)
Hello [name],  Your lab result is normal and/or stable.Some minor variations that are not significant are commonly marked abnormal, but do not represent any medical problem for you.  Best regards, [name], M.D.

## 2023-04-03 ENCOUNTER — Other Ambulatory Visit: Payer: Self-pay

## 2023-04-03 DIAGNOSIS — Z780 Asymptomatic menopausal state: Secondary | ICD-10-CM

## 2023-04-10 ENCOUNTER — Encounter: Payer: Self-pay | Admitting: Family Medicine

## 2023-04-10 ENCOUNTER — Other Ambulatory Visit: Payer: Self-pay

## 2023-04-10 DIAGNOSIS — N6320 Unspecified lump in the left breast, unspecified quadrant: Secondary | ICD-10-CM

## 2023-04-13 ENCOUNTER — Other Ambulatory Visit (HOSPITAL_COMMUNITY): Payer: Self-pay | Admitting: Family Medicine

## 2023-04-13 DIAGNOSIS — N63 Unspecified lump in unspecified breast: Secondary | ICD-10-CM

## 2023-04-30 ENCOUNTER — Other Ambulatory Visit: Payer: Medicare Other

## 2023-04-30 ENCOUNTER — Ambulatory Visit (INDEPENDENT_AMBULATORY_CARE_PROVIDER_SITE_OTHER): Payer: Medicare Other | Admitting: Family Medicine

## 2023-04-30 ENCOUNTER — Encounter: Payer: Self-pay | Admitting: Family Medicine

## 2023-04-30 VITALS — BP 146/67 | HR 94 | Temp 97.7°F | Ht 61.0 in | Wt 146.4 lb

## 2023-04-30 DIAGNOSIS — I10 Essential (primary) hypertension: Secondary | ICD-10-CM

## 2023-04-30 MED ORDER — LISINOPRIL-HYDROCHLOROTHIAZIDE 20-12.5 MG PO TABS: 1.0000 | ORAL_TABLET | Freq: Every day | ORAL | 3 refills | Status: DC

## 2023-04-30 NOTE — Progress Notes (Signed)
Subjective:  Patient ID: [name], female    DOB: 1945/10/05  Age: 77 y.o. MRN: [id number]  CC: Follow-up and Hypothyroidism   HPI [name] presents for  follow-up of hypertension. Patient has no history of headache chest pain or shortness of breath or recent cough. Patient also denies symptoms of TIA such as focal numbness or weakness. Patient denies side effects from medication. States taking it regularly.   History [name] has a past medical history of Allergy, Asthma, Colon polyps, GERD (gastroesophageal reflux disease), Hyperlipidemia, Hypertension, Menopausal state, Osteopenia, Rhinitis, Thyroid disease, and Vitamin D deficiency disease.   She has a past surgical history that includes Tonsillectomy; Cholecystectomy (2005); and Colonoscopy (2099).   Her family history includes AAA (abdominal aortic aneurysm) in her mother; Asthma in her brother; Colon cancer (age of onset: 14) in her mother; Colon polyps in her mother; Gout in her sister; Heart attack in her father; Heart disease in her father; Hyperlipidemia in her mother; Hypertension in her brother, mother, and sister; Osteoporosis in her mother; Thyroid disease in her mother.She reports that she has never smoked. She has never used smokeless tobacco. She reports that she does not drink alcohol and does not use drugs.  Current Outpatient Medications on File Prior to Visit  Medication Sig Dispense Refill   albuterol (VENTOLIN HFA) 108 (90 Base) MCG/ACT inhaler Inhale 2 puffs into the lungs every 6 (six) hours as needed for wheezing. 18 g 11   alendronate (FOSAMAX) 70 MG tablet Take 1 tablet (70 mg total) by mouth every 7 (seven) days. Take with a full glass of water on an empty stomach. 13 tablet 3   amLODipine (NORVASC) 5 MG tablet Take 1 tablet (5 mg total) by mouth daily. 90 tablet 3   aspirin EC 81 MG tablet Take 81 mg by mouth daily. Swallow whole.     atorvastatin (LIPITOR) 40 MG tablet Take 1 tablet (40 mg total) by  mouth daily. 90 tablet 3   Cholecalciferol (VITAMIN D3) 3000 UNITS TABS Take 2,000 Units by mouth daily.     fluticasone (FLONASE) 50 MCG/ACT nasal spray Use 2 spray(s) in each nostril once daily 48 g 3   fluticasone-salmeterol (ADVAIR) 100-50 MCG/ACT AEPB Inhale 1 puff into the lungs 2 (two) times daily. 180 each 3   levothyroxine (SYNTHROID) 75 MCG tablet Take 1 tablet (75 mcg total) by mouth daily. 90 tablet 0   Multiple Vitamin (MULTIVITAMIN WITH MINERALS) TABS Take 1 tablet by mouth daily.     pantoprazole (PROTONIX) 40 MG tablet Take 1 tablet (40 mg total) by mouth daily. For stomach 90 tablet 3   No current facility-administered medications on file prior to visit.    ROS Review of Systems  Constitutional: Negative.   HENT: Negative.    Eyes:  Negative for visual disturbance.  Respiratory:  Negative for shortness of breath.   Cardiovascular:  Negative for chest pain.  Gastrointestinal:  Negative for abdominal pain.  Musculoskeletal:  Negative for arthralgias.    Objective:  BP (!) 146/67   Pulse 94   Temp 97.7 F (36.5 C)   Ht 5\' 1"  (1.549 m)   Wt 146 lb 6.4 oz (66.4 kg)   SpO2 98%   BMI 27.66 kg/m   BP Readings from Last 3 Encounters:  04/30/23 (!) 146/67  03/30/23 (!) 142/72  12/01/22 134/70    Wt Readings from Last 3 Encounters:  04/30/23 146 lb 6.4 oz (66.4 kg)  03/30/23 144  lb 3.2 oz (65.4 kg)  12/01/22 143 lb 3.2 oz (65 kg)     Physical Exam Constitutional:      General: She is not in acute distress.    Appearance: She is well-developed.  Cardiovascular:     Rate and Rhythm: Normal rate and regular rhythm.  Pulmonary:     Breath sounds: Normal breath sounds.  Musculoskeletal:        General: Normal range of motion.  Skin:    General: Skin is warm and dry.  Neurological:     Mental Status: She is alert and oriented to person, place, and time.       Assessment & Plan:   Jennifer "[name]" was seen today for follow-up and  hypothyroidism.  Diagnoses and all orders for this visit:  Essential hypertension  Other orders -     lisinopril-hydrochlorothiazide (ZESTORETIC) 20-12.5 MG tablet; Take 1 tablet by mouth daily.   Allergies as of 04/30/2023       Reactions   Evista [raloxifene Hcl] Other (See Comments)   Bone pain, couldn't put weight on right leg/foot   Fosamax [alendronate Sodium]    Bone pain   Red Dye #40 (allura Red)    Sulfa Antibiotics         Medication List        Accurate as of April 30, 2023 11:59 PM. If you have any questions, ask your nurse or doctor.          STOP taking these medications    lisinopril 20 MG tablet Commonly known as: ZESTRIL Stopped by: Arjan Strohm       TAKE these medications    albuterol 108 (90 Base) MCG/ACT inhaler Commonly known as: VENTOLIN HFA Inhale 2 puffs into the lungs every 6 (six) hours as needed for wheezing.   alendronate 70 MG tablet Commonly known as: FOSAMAX Take 1 tablet (70 mg total) by mouth every 7 (seven) days. Take with a full glass of water on an empty stomach.   amLODipine 5 MG tablet Commonly known as: NORVASC Take 1 tablet (5 mg total) by mouth daily.   aspirin EC 81 MG tablet Take 81 mg by mouth daily. Swallow whole.   atorvastatin 40 MG tablet Commonly known as: LIPITOR Take 1 tablet (40 mg total) by mouth daily.   fluticasone 50 MCG/ACT nasal spray Commonly known as: FLONASE Use 2 spray(s) in each nostril once daily   fluticasone-salmeterol 100-50 MCG/ACT Aepb Commonly known as: ADVAIR Inhale 1 puff into the lungs 2 (two) times daily.   levothyroxine 75 MCG tablet Commonly known as: SYNTHROID Take 1 tablet (75 mcg total) by mouth daily.   lisinopril-hydrochlorothiazide 20-12.5 MG tablet Commonly known as: Zestoretic Take 1 tablet by mouth daily. Started by: Broadus John Zaley Talley   multivitamin with minerals Tabs tablet Take 1 tablet by mouth daily.   pantoprazole 40 MG tablet Commonly known as:  PROTONIX Take 1 tablet (40 mg total) by mouth daily. For stomach   Vitamin D3 75 MCG (3000 UT) Tabs Take 2,000 Units by mouth daily.        Meds ordered this encounter  Medications   lisinopril-hydrochlorothiazide (ZESTORETIC) 20-12.5 MG tablet    Sig: Take 1 tablet by mouth daily.    Dispense:  90 tablet    Refill:  3    DEXA today. Reminded to take fosamax and calcium & vitamin D  Follow-up: Return in about 3 months (around 07/30/2023) for hypertension.  Mechele Claude, M.D.

## 2023-05-12 ENCOUNTER — Ambulatory Visit (HOSPITAL_COMMUNITY)
Admission: RE | Admit: 2023-05-12 | Discharge: 2023-05-12 | Disposition: A | Discharge status: Home / Self Care | Payer: Medicare Other | Source: Ambulatory Visit | Attending: Family Medicine | Admitting: Family Medicine

## 2023-05-12 DIAGNOSIS — N6320 Unspecified lump in the left breast, unspecified quadrant: Secondary | ICD-10-CM

## 2023-05-12 DIAGNOSIS — N6321 Unspecified lump in the left breast, upper outer quadrant: Secondary | ICD-10-CM | POA: Diagnosis not present

## 2023-05-12 DIAGNOSIS — Z1239 Encounter for other screening for malignant neoplasm of breast: Secondary | ICD-10-CM | POA: Insufficient documentation

## 2023-05-12 DIAGNOSIS — R92333 Mammographic heterogeneous density, bilateral breasts: Secondary | ICD-10-CM | POA: Insufficient documentation

## 2023-05-12 DIAGNOSIS — R92323 Mammographic fibroglandular density, bilateral breasts: Secondary | ICD-10-CM | POA: Diagnosis not present

## 2023-05-12 DIAGNOSIS — N63 Unspecified lump in unspecified breast: Secondary | ICD-10-CM

## 2023-05-20 ENCOUNTER — Encounter: Payer: Self-pay | Admitting: Family Medicine

## 2023-06-10 ENCOUNTER — Ambulatory Visit (INDEPENDENT_AMBULATORY_CARE_PROVIDER_SITE_OTHER): Payer: Medicare Other

## 2023-06-10 DIAGNOSIS — Z23 Encounter for immunization: Secondary | ICD-10-CM | POA: Diagnosis not present

## 2023-07-30 ENCOUNTER — Encounter: Payer: Self-pay | Admitting: Family Medicine

## 2023-07-30 ENCOUNTER — Ambulatory Visit: Payer: Medicare Other | Admitting: Family Medicine

## 2023-07-30 ENCOUNTER — Other Ambulatory Visit: Payer: PRIVATE HEALTH INSURANCE

## 2023-07-30 VITALS — BP 129/64 | HR 99 | Temp 97.9°F | Ht 61.0 in | Wt 147.4 lb

## 2023-07-30 DIAGNOSIS — I10 Essential (primary) hypertension: Secondary | ICD-10-CM

## 2023-07-30 DIAGNOSIS — E7800 Pure hypercholesterolemia, unspecified: Secondary | ICD-10-CM

## 2023-07-30 DIAGNOSIS — E039 Hypothyroidism, unspecified: Secondary | ICD-10-CM

## 2023-07-30 DIAGNOSIS — J4540 Moderate persistent asthma, uncomplicated: Secondary | ICD-10-CM

## 2023-07-30 MED ORDER — FLUTICASONE FUROATE-VILANTEROL 200-25 MCG/ACT IN AEPB: 1.0000 | INHALATION_SPRAY | Freq: Every day | RESPIRATORY_TRACT | 11 refills | Status: DC

## 2023-07-30 NOTE — Progress Notes (Signed)
Subjective:  Patient ID: [name], female    DOB: 1946-05-27  Age: 77 y.o. MRN: [id number]  CC: Medical Management of Chronic Issues and Hypertension   HPI [name] presents for  follow-up of hypertension. Patient has no history of headache chest pain or shortness of breath or recent cough. Patient also denies symptoms of TIA such as focal numbness or weakness. Patient denies side effects from medication. States taking it regularly.   follow-up on  thyroid. The patient has a history of hypothyroidism for many years. It has been stable recently. Pt. denies any change in  voice, loss of hair, heat or cold intolerance. Energy level has been adequate to good. Patient denies constipation and diarrhea. No myxedema. Medication is as noted below. Verified that pt is taking it daily on an empty stomach. Well tolerated.   in for follow-up of elevated cholesterol. Doing well without complaints on current medication. Denies side effects of statin including myalgia and arthralgia and nausea. Currently no chest pain, shortness of breath or other cardiovascular related symptoms noted.   History [name] has a past medical history of Allergy, Asthma, Colon polyps, GERD (gastroesophageal reflux disease), Hyperlipidemia, Hypertension, Menopausal state, Osteopenia, Rhinitis, Thyroid disease, and Vitamin D deficiency disease.   She has a past surgical history that includes Tonsillectomy; Cholecystectomy (2005); and Colonoscopy (2099).   Her family history includes AAA (abdominal aortic aneurysm) in her mother; Asthma in her brother; Colon cancer (age of onset: 75) in her mother; Colon polyps in her mother; Gout in her sister; Heart attack in her father; Heart disease in her father; Hyperlipidemia in her mother; Hypertension in her brother, mother, and sister; Osteoporosis in her mother; Thyroid disease in her mother.She reports that she has never smoked. She has never used smokeless tobacco. She reports that  she does not drink alcohol and does not use drugs.  Current Outpatient Medications on File Prior to Visit  Medication Sig Dispense Refill   albuterol (VENTOLIN HFA) 108 (90 Base) MCG/ACT inhaler Inhale 2 puffs into the lungs every 6 (six) hours as needed for wheezing. 18 g 11   alendronate (FOSAMAX) 70 MG tablet Take 1 tablet (70 mg total) by mouth every 7 (seven) days. Take with a full glass of water on an empty stomach. 13 tablet 3   amLODipine (NORVASC) 5 MG tablet Take 1 tablet (5 mg total) by mouth daily. 90 tablet 3   aspirin EC 81 MG tablet Take 81 mg by mouth daily. Swallow whole.     atorvastatin (LIPITOR) 40 MG tablet Take 1 tablet (40 mg total) by mouth daily. 90 tablet 3   Cholecalciferol (VITAMIN D3) 3000 UNITS TABS Take 2,000 Units by mouth daily.     fluticasone (FLONASE) 50 MCG/ACT nasal spray Use 2 spray(s) in each nostril once daily 48 g 3   levothyroxine (SYNTHROID) 75 MCG tablet Take 1 tablet (75 mcg total) by mouth daily. 90 tablet 0   lisinopril-hydrochlorothiazide (ZESTORETIC) 20-12.5 MG tablet Take 1 tablet by mouth daily. 90 tablet 3   Multiple Vitamin (MULTIVITAMIN WITH MINERALS) TABS Take 1 tablet by mouth daily.     pantoprazole (PROTONIX) 40 MG tablet Take 1 tablet (40 mg total) by mouth daily. For stomach 90 tablet 3   No current facility-administered medications on file prior to visit.    ROS Review of Systems  Constitutional: Negative.   HENT: Negative.    Eyes:  Negative for visual disturbance.  Respiratory:  Negative for shortness of breath.  Cardiovascular:  Negative for chest pain.  Gastrointestinal:  Negative for abdominal pain.  Musculoskeletal:  Negative for arthralgias.    Objective:  BP 129/64   Pulse 99   Temp 97.9 F (36.6 C) (Temporal)   Ht 5\' 1"  (1.549 m)   Wt 147 lb 6.4 oz (66.9 kg)   SpO2 99%   BMI 27.85 kg/m   BP Readings from Last 3 Encounters:  07/30/23 129/64  04/30/23 (!) 146/67  03/30/23 (!) 142/72    Wt Readings  from Last 3 Encounters:  07/30/23 147 lb 6.4 oz (66.9 kg)  04/30/23 146 lb 6.4 oz (66.4 kg)  03/30/23 144 lb 3.2 oz (65.4 kg)     Physical Exam Constitutional:      General: She is not in acute distress.    Appearance: She is well-developed.  Neck:     Thyroid: No thyromegaly.     Vascular: No carotid bruit.  Cardiovascular:     Rate and Rhythm: Normal rate and regular rhythm.  Pulmonary:     Breath sounds: Normal breath sounds.  Musculoskeletal:        General: Normal range of motion.     Cervical back: No tenderness.  Skin:    General: Skin is warm and dry.  Neurological:     Mental Status: She is alert and oriented to person, place, and time.       Assessment & Plan:   [name] "[name]" was seen today for medical management of chronic issues and hypertension.  Diagnoses and all orders for this visit:  Hypothyroidism, unspecified type -     Lipid panel -     TSH + free T4 -     CMP14+EGFR -     CBC with Differential/Platelet  Pure hypercholesterolemia -     Lipid panel -     TSH + free T4 -     CMP14+EGFR -     CBC with Differential/Platelet  Essential hypertension -     Lipid panel -     TSH + free T4 -     CMP14+EGFR -     CBC with Differential/Platelet  Asthma, chronic, moderate persistent, uncomplicated -     Lipid panel -     TSH + free T4 -     CMP14+EGFR -     CBC with Differential/Platelet  Other orders -     fluticasone furoate-vilanterol (BREO ELLIPTA) 200-25 MCG/ACT AEPB; Inhale 1 puff into the lungs daily.   Allergies as of 07/30/2023       Reactions   Evista [raloxifene Hcl] Other (See Comments)   Bone pain, couldn't put weight on right leg/foot   Fosamax [alendronate Sodium]    Bone pain   Red Dye #40 (allura Red)    Sulfa Antibiotics         Medication List        Accurate as of July 30, 2023 10:40 AM. If you have any questions, ask your nurse or doctor.          STOP taking these medications     fluticasone-salmeterol 100-50 MCG/ACT Aepb Commonly known as: ADVAIR Stopped by: Gaytha Raybourn       TAKE these medications    albuterol 108 (90 Base) MCG/ACT inhaler Commonly known as: VENTOLIN HFA Inhale 2 puffs into the lungs every 6 (six) hours as needed for wheezing.   alendronate 70 MG tablet Commonly known as: FOSAMAX Take 1 tablet (70 mg total) by mouth every 7 (seven)  days. Take with a full glass of water on an empty stomach.   amLODipine 5 MG tablet Commonly known as: NORVASC Take 1 tablet (5 mg total) by mouth daily.   aspirin EC 81 MG tablet Take 81 mg by mouth daily. Swallow whole.   atorvastatin 40 MG tablet Commonly known as: LIPITOR Take 1 tablet (40 mg total) by mouth daily.   fluticasone 50 MCG/ACT nasal spray Commonly known as: FLONASE Use 2 spray(s) in each nostril once daily   fluticasone furoate-vilanterol 200-25 MCG/ACT Aepb Commonly known as: Breo Ellipta Inhale 1 puff into the lungs daily. Started by: Broadus John Alixandra Alfieri   levothyroxine 75 MCG tablet Commonly known as: SYNTHROID Take 1 tablet (75 mcg total) by mouth daily.   lisinopril-hydrochlorothiazide 20-12.5 MG tablet Commonly known as: Zestoretic Take 1 tablet by mouth daily.   multivitamin with minerals Tabs tablet Take 1 tablet by mouth daily.   pantoprazole 40 MG tablet Commonly known as: PROTONIX Take 1 tablet (40 mg total) by mouth daily. For stomach   Vitamin D3 75 MCG (3000 UT) Tabs Take 2,000 Units by mouth daily.        Meds ordered this encounter  Medications   fluticasone furoate-vilanterol (BREO ELLIPTA) 200-25 MCG/ACT AEPB    Sig: Inhale 1 puff into the lungs daily.    Dispense:  1 each    Refill:  11      Follow-up: Return in about 6 months (around 01/28/2024).  Mechele Claude, M.D.

## 2023-07-31 LAB — CBC WITH DIFFERENTIAL/PLATELET
Basophils Absolute: 0 10*3/uL (ref 0.0–0.2)
Basos: 1 %
EOS (ABSOLUTE): 0.2 10*3/uL (ref 0.0–0.4)
Eos: 3 %
Hematocrit: 35.5 % (ref 34.0–46.6)
Hemoglobin: 11.6 g/dL (ref 11.1–15.9)
Immature Grans (Abs): 0 10*3/uL (ref 0.0–0.1)
Immature Granulocytes: 0 %
Lymphocytes Absolute: 0.6 10*3/uL — ABNORMAL LOW (ref 0.7–3.1)
Lymphs: 9 %
MCH: 30.4 pg (ref 26.6–33.0)
MCHC: 32.7 g/dL (ref 31.5–35.7)
MCV: 93 fL (ref 79–97)
Monocytes Absolute: 0.6 10*3/uL (ref 0.1–0.9)
Monocytes: 10 %
Neutrophils Absolute: 5.2 10*3/uL (ref 1.4–7.0)
Neutrophils: 77 %
Platelets: 391 10*3/uL (ref 150–450)
RBC: 3.82 x10E6/uL (ref 3.77–5.28)
RDW: 12.6 % (ref 11.7–15.4)
WBC: 6.6 10*3/uL (ref 3.4–10.8)

## 2023-07-31 LAB — CMP14+EGFR
ALT: 15 [IU]/L (ref 0–32)
AST: 19 [IU]/L (ref 0–40)
Albumin: 4 g/dL (ref 3.8–4.8)
Alkaline Phosphatase: 88 [IU]/L (ref 44–121)
BUN/Creatinine Ratio: 13 (ref 12–28)
BUN: 14 mg/dL (ref 8–27)
Bilirubin Total: 0.6 mg/dL (ref 0.0–1.2)
CO2: 23 mmol/L (ref 20–29)
Calcium: 10.3 mg/dL (ref 8.7–10.3)
Chloride: 97 mmol/L (ref 96–106)
Creatinine, Ser: 1.12 mg/dL — ABNORMAL HIGH (ref 0.57–1.00)
Globulin, Total: 2.8 g/dL (ref 1.5–4.5)
Glucose: 102 mg/dL — ABNORMAL HIGH (ref 70–99)
Potassium: 5.2 mmol/L (ref 3.5–5.2)
Sodium: 129 mmol/L — ABNORMAL LOW (ref 134–144)
Total Protein: 6.8 g/dL (ref 6.0–8.5)
eGFR: 50 mL/min/{1.73_m2} — ABNORMAL LOW (ref >59)

## 2023-07-31 LAB — TSH+FREE T4
Free T4: 1.92 ng/dL — ABNORMAL HIGH (ref 0.82–1.77)
TSH: 0.096 u[IU]/mL — ABNORMAL LOW (ref 0.450–4.500)

## 2023-07-31 LAB — LIPID PANEL
Chol/HDL Ratio: 2.1 {ratio} (ref 0.0–4.4)
Cholesterol, Total: 146 mg/dL (ref 100–199)
HDL: 69 mg/dL (ref >39)
LDL Chol Calc (NIH): 61 mg/dL (ref 0–99)
Triglycerides: 87 mg/dL (ref 0–149)
VLDL Cholesterol Cal: 16 mg/dL (ref 5–40)

## 2023-09-14 ENCOUNTER — Other Ambulatory Visit: Payer: Self-pay | Admitting: Family Medicine

## 2023-09-14 DIAGNOSIS — E039 Hypothyroidism, unspecified: Secondary | ICD-10-CM

## 2024-02-01 ENCOUNTER — Ambulatory Visit (INDEPENDENT_AMBULATORY_CARE_PROVIDER_SITE_OTHER): Payer: PRIVATE HEALTH INSURANCE | Admitting: Family Medicine

## 2024-02-01 ENCOUNTER — Encounter: Payer: Self-pay | Admitting: Family Medicine

## 2024-02-01 ENCOUNTER — Ambulatory Visit (INDEPENDENT_AMBULATORY_CARE_PROVIDER_SITE_OTHER): Payer: PRIVATE HEALTH INSURANCE

## 2024-02-01 DIAGNOSIS — Z780 Asymptomatic menopausal state: Secondary | ICD-10-CM

## 2024-02-01 DIAGNOSIS — I10 Essential (primary) hypertension: Secondary | ICD-10-CM

## 2024-02-01 DIAGNOSIS — E039 Hypothyroidism, unspecified: Secondary | ICD-10-CM | POA: Diagnosis not present

## 2024-02-01 DIAGNOSIS — J4540 Moderate persistent asthma, uncomplicated: Secondary | ICD-10-CM

## 2024-02-01 MED ORDER — IBANDRONATE SODIUM 150 MG PO TABS: 150.0000 mg | ORAL_TABLET | ORAL | 3 refills | Status: AC

## 2024-02-01 MED ORDER — ATORVASTATIN CALCIUM 40 MG PO TABS: 40.0000 mg | ORAL_TABLET | Freq: Every day | ORAL | 3 refills | Status: AC

## 2024-02-01 MED ORDER — LISINOPRIL-HYDROCHLOROTHIAZIDE 20-12.5 MG PO TABS: 1.0000 | ORAL_TABLET | Freq: Every day | ORAL | 3 refills | Status: DC

## 2024-02-01 MED ORDER — AMLODIPINE BESYLATE 5 MG PO TABS: 5.0000 mg | ORAL_TABLET | Freq: Every day | ORAL | 3 refills | Status: AC

## 2024-02-01 MED ORDER — PANTOPRAZOLE SODIUM 40 MG PO TBEC: 40.0000 mg | DELAYED_RELEASE_TABLET | Freq: Every day | ORAL | 3 refills | Status: AC

## 2024-02-01 MED ORDER — CETIRIZINE HCL 5 MG PO TABS: 5.0000 mg | ORAL_TABLET | Freq: Every day | ORAL | 5 refills | Status: AC

## 2024-02-01 MED ORDER — LEVOTHYROXINE SODIUM 75 MCG PO TABS: 75.0000 ug | ORAL_TABLET | Freq: Every day | ORAL | 1 refills | Status: DC

## 2024-02-01 MED ORDER — FLUTICASONE PROPIONATE 50 MCG/ACT NA SUSP: NASAL | 3 refills | Status: AC

## 2024-02-01 NOTE — Progress Notes (Signed)
 Subjective:  Patient ID: [name], female    DOB: 1946/07/18  Age: 78 y.o. MRN: [id number]  CC: Back Pain (Back bothering her after vacuuming on Wednesday. Better today. Lower back pain. ) and Sinus Problem (Sinus issues since warm weather started. Drainage that clogs up throat. Not takign any medication. )   HPI [name] presents for continued back pain. Getting better. Points to midline L4-5 region. Gelps to lift left leg, but denies radiation. Didn't try meds.    follow-up on  thyroid . The patient has a history of hypothyroidism for many years. It has been stable recently. Pt. denies any change in  voice, loss of hair, heat or cold intolerance. Energy level has been adequate to good. Patient denies constipation and diarrhea. No myxedema. Medication is as noted below. Verified that pt is taking it daily on an empty stomach. Well tolerated.    Follow-up of hypertension. Patient has no history of headache chest pain or shortness of breath or recent cough. Patient also denies symptoms of TIA such as numbness weakness lateralizing. Patient checks  blood pressure at home and has not had any elevated readings recently. Patient denies side effects from his medication. States taking it regularly.  Patient in for follow-up of elevated cholesterol. Doing well without complaints on current medication. Denies side effects of statin including myalgia and arthralgia and nausea. Also in today for liver function testing. Currently no chest pain, shortness of breath or other cardiovascular related symptoms noted.  Patient in for follow-up of GERD. Currently asymptomatic taking  PPI daily. There is no chest pain or heartburn. No hematemesis and no melena. No dysphagia or choking. Onset is remote. Progression is stable. Complicating factors, none.      02/01/2024    8:21 AM 07/30/2023   10:09 AM 04/30/2023    9:31 AM  Depression screen PHQ 2/9  Decreased Interest 0 0 0  Down, Depressed, Hopeless 0 0  0  PHQ - 2 Score 0 0 0  Altered sleeping 0 0   Tired, decreased energy 2 0   Change in appetite 0 0   Feeling bad or failure about yourself  0 0   Trouble concentrating 0 0   Moving slowly or fidgety/restless 0 0   Suicidal thoughts 0 0   PHQ-9 Score 2 0   Difficult doing work/chores Not difficult at all Not difficult at all     History [name] has a past medical history of Allergy, Asthma, Colon polyps, GERD (gastroesophageal reflux disease), Hyperlipidemia, Hypertension, Menopausal state, Osteopenia, Rhinitis, Thyroid  disease, and Vitamin D  deficiency disease.   She has a past surgical history that includes Tonsillectomy; Cholecystectomy (2005); and Colonoscopy (2099).   Her family history includes AAA (abdominal aortic aneurysm) in her mother; Asthma in her brother; Colon cancer (age of onset: 22) in her mother; Colon polyps in her mother; Gout in her sister; Heart attack in her father; Heart disease in her father; Hyperlipidemia in her mother; Hypertension in her brother, mother, and sister; Osteoporosis in her mother; Thyroid  disease in her mother.She reports that she has never smoked. She has never used smokeless tobacco. She reports that she does not drink alcohol and does not use drugs.    ROS Review of Systems  Constitutional: Negative.   HENT: Negative.    Eyes:  Negative for visual disturbance.  Respiratory:  Negative for shortness of breath.   Cardiovascular:  Negative for chest pain.  Gastrointestinal:  Negative for abdominal pain.  Musculoskeletal:  Negative for arthralgias.    Objective:  BP 130/68   Pulse 95   Temp 97.8 F (36.6 C)   Ht 5\' 1"  (1.549 m)   Wt 146 lb (66.2 kg)   SpO2 99%   BMI 27.59 kg/m   BP Readings from Last 3 Encounters:  02/01/24 130/68  07/30/23 129/64  04/30/23 (!) 146/67    Wt Readings from Last 3 Encounters:  02/01/24 146 lb (66.2 kg)  07/30/23 147 lb 6.4 oz (66.9 kg)  04/30/23 146 lb 6.4 oz (66.4 kg)     Physical  Exam Constitutional:      General: She is not in acute distress.    Appearance: She is well-developed.  HENT:     Head: Normocephalic and atraumatic.  Eyes:     Conjunctiva/sclera: Conjunctivae normal.     Pupils: Pupils are equal, round, and reactive to light.  Neck:     Thyroid : No thyromegaly.  Cardiovascular:     Rate and Rhythm: Normal rate and regular rhythm.     Heart sounds: Normal heart sounds. No murmur heard. Pulmonary:     Effort: Pulmonary effort is normal. No respiratory distress.     Breath sounds: Normal breath sounds. No wheezing or rales.  Abdominal:     General: Bowel sounds are normal. There is no distension.     Palpations: Abdomen is soft.     Tenderness: There is no abdominal tenderness.  Musculoskeletal:        General: Normal range of motion.     Cervical back: Normal range of motion and neck supple.  Lymphadenopathy:     Cervical: No cervical adenopathy.  Skin:    General: Skin is warm and dry.  Neurological:     Mental Status: She is alert and oriented to person, place, and time.  Psychiatric:        Behavior: Behavior normal.        Thought Content: Thought content normal.        Judgment: Judgment normal.      Assessment & Plan:  Essential hypertension -     CMP14+EGFR -     CBC with Differential/Platelet -     amLODIPine  Besylate; Take 1 tablet (5 mg total) by mouth daily.  Dispense: 90 tablet; Refill: 3  Hypothyroidism, unspecified type -     TSH + free T4 -     CMP14+EGFR -     CBC with Differential/Platelet -     Levothyroxine  Sodium; Take 1 tablet (75 mcg total) by mouth daily.  Dispense: 90 tablet; Refill: 1  Asthma, chronic, moderate persistent, uncomplicated -     CMP14+EGFR -     CBC with Differential/Platelet -     Fluticasone  Propionate; Use 2 spray(s) in each nostril once daily  Dispense: 48 g; Refill: 3  Other orders -     Atorvastatin  Calcium ; Take 1 tablet (40 mg total) by mouth daily.  Dispense: 90 tablet; Refill:  3 -     Pantoprazole  Sodium; Take 1 tablet (40 mg total) by mouth daily. For stomach  Dispense: 90 tablet; Refill: 3 -     Lisinopril -hydroCHLOROthiazide ; Take 1 tablet by mouth daily.  Dispense: 90 tablet; Refill: 3 -     Ibandronate Sodium; Take 1 tablet (150 mg total) by mouth every 30 (thirty) days. Take in the morning with a full glass of water, on an empty stomach, and do not take anything else by mouth or lie down for the next 30 min.  Dispense: 3 tablet; Refill: 3 -     Cetirizine HCl; Take 1 tablet (5 mg total) by mouth daily.  Dispense: 90 tablet; Refill: 5     Follow-up: Return in about 6 months (around 08/02/2024).  Roise Cleaver, M.D.

## 2024-02-02 DIAGNOSIS — Z780 Asymptomatic menopausal state: Secondary | ICD-10-CM | POA: Diagnosis not present

## 2024-02-02 DIAGNOSIS — M810 Age-related osteoporosis without current pathological fracture: Secondary | ICD-10-CM | POA: Diagnosis not present

## 2024-02-03 ENCOUNTER — Other Ambulatory Visit: Payer: Self-pay

## 2024-02-03 ENCOUNTER — Other Ambulatory Visit

## 2024-02-03 DIAGNOSIS — E7800 Pure hypercholesterolemia, unspecified: Secondary | ICD-10-CM

## 2024-02-03 DIAGNOSIS — I10 Essential (primary) hypertension: Secondary | ICD-10-CM

## 2024-02-03 DIAGNOSIS — E039 Hypothyroidism, unspecified: Secondary | ICD-10-CM

## 2024-02-04 LAB — CBC WITH DIFFERENTIAL/PLATELET
Basophils Absolute: 0.1 10*3/uL (ref 0.0–0.2)
Basos: 1 %
EOS (ABSOLUTE): 0.2 10*3/uL (ref 0.0–0.4)
Eos: 2 %
Hematocrit: 35 % (ref 34.0–46.6)
Hemoglobin: 11.5 g/dL (ref 11.1–15.9)
Immature Grans (Abs): 0 10*3/uL (ref 0.0–0.1)
Immature Granulocytes: 0 %
Lymphocytes Absolute: 0.9 10*3/uL (ref 0.7–3.1)
Lymphs: 12 %
MCH: 30 pg (ref 26.6–33.0)
MCHC: 32.9 g/dL (ref 31.5–35.7)
MCV: 91 fL (ref 79–97)
Monocytes Absolute: 0.5 10*3/uL (ref 0.1–0.9)
Monocytes: 7 %
Neutrophils Absolute: 5.7 10*3/uL (ref 1.4–7.0)
Neutrophils: 78 %
Platelets: 350 10*3/uL (ref 150–450)
RBC: 3.83 x10E6/uL (ref 3.77–5.28)
RDW: 12.5 % (ref 11.7–15.4)
WBC: 7.4 10*3/uL (ref 3.4–10.8)

## 2024-02-04 LAB — CMP14+EGFR
ALT: 15 IU/L (ref 0–32)
AST: 19 IU/L (ref 0–40)
Albumin: 4.1 g/dL (ref 3.8–4.8)
Alkaline Phosphatase: 97 IU/L (ref 44–121)
BUN/Creatinine Ratio: 10 — ABNORMAL LOW (ref 12–28)
BUN: 10 mg/dL (ref 8–27)
Bilirubin Total: 0.8 mg/dL (ref 0.0–1.2)
CO2: 18 mmol/L — ABNORMAL LOW (ref 20–29)
Calcium: 10.5 mg/dL — ABNORMAL HIGH (ref 8.7–10.3)
Chloride: 91 mmol/L — ABNORMAL LOW (ref 96–106)
Creatinine, Ser: 1.03 mg/dL — ABNORMAL HIGH (ref 0.57–1.00)
Globulin, Total: 2.6 g/dL (ref 1.5–4.5)
Glucose: 99 mg/dL (ref 70–99)
Potassium: 4.6 mmol/L (ref 3.5–5.2)
Sodium: 125 mmol/L — ABNORMAL LOW (ref 134–144)
Total Protein: 6.7 g/dL (ref 6.0–8.5)
eGFR: 55 mL/min/{1.73_m2} — ABNORMAL LOW (ref >59)

## 2024-02-04 LAB — TSH+FREE T4
Free T4: 2.34 ng/dL — ABNORMAL HIGH (ref 0.82–1.77)
TSH: 0.043 u[IU]/mL — ABNORMAL LOW (ref 0.450–4.500)

## 2024-02-08 ENCOUNTER — Ambulatory Visit: Payer: Self-pay | Admitting: Family Medicine

## 2024-02-08 NOTE — Progress Notes (Signed)
 DEXA shows osteoporosis. I recommend Boniva . Take monthly. Take calcium  500 mg BID and Vitamin D  2000 units daily.

## 2024-02-09 ENCOUNTER — Ambulatory Visit: Payer: Self-pay | Admitting: Family Medicine

## 2024-02-09 ENCOUNTER — Other Ambulatory Visit: Payer: Self-pay | Admitting: Family Medicine

## 2024-02-09 DIAGNOSIS — E039 Hypothyroidism, unspecified: Secondary | ICD-10-CM

## 2024-02-09 MED ORDER — LEVOTHYROXINE SODIUM 50 MCG PO TABS
50.0000 ug | ORAL_TABLET | Freq: Every day | ORAL | 0 refills | Status: DC
Start: 2024-02-09 — End: 2024-04-29

## 2024-04-01 ENCOUNTER — Other Ambulatory Visit

## 2024-04-01 DIAGNOSIS — E039 Hypothyroidism, unspecified: Secondary | ICD-10-CM | POA: Diagnosis not present

## 2024-04-02 LAB — T4, FREE: Free T4: 1.39 ng/dL (ref 0.82–1.77)

## 2024-04-02 LAB — TSH: TSH: 2.35 u[IU]/mL (ref 0.450–4.500)

## 2024-04-04 ENCOUNTER — Ambulatory Visit: Payer: Self-pay | Admitting: Family Medicine

## 2024-04-04 NOTE — Progress Notes (Signed)
Hello Zyonna,  Your lab result is normal and/or stable.Some minor variations that are not significant are commonly marked abnormal, but do not represent any medical problem for you.  Best regards, Mechele Claude, M.D.

## 2024-04-06 ENCOUNTER — Encounter: Payer: Self-pay | Admitting: Family Medicine

## 2024-04-06 ENCOUNTER — Ambulatory Visit (INDEPENDENT_AMBULATORY_CARE_PROVIDER_SITE_OTHER): Admitting: Family Medicine

## 2024-04-06 VITALS — BP 123/62 | HR 107 | Temp 97.5°F | Ht 61.0 in | Wt 147.2 lb

## 2024-04-06 DIAGNOSIS — I10 Essential (primary) hypertension: Secondary | ICD-10-CM | POA: Diagnosis not present

## 2024-04-06 DIAGNOSIS — H2513 Age-related nuclear cataract, bilateral: Secondary | ICD-10-CM | POA: Diagnosis not present

## 2024-04-06 DIAGNOSIS — E039 Hypothyroidism, unspecified: Secondary | ICD-10-CM

## 2024-04-06 DIAGNOSIS — H02831 Dermatochalasis of right upper eyelid: Secondary | ICD-10-CM | POA: Diagnosis not present

## 2024-04-06 DIAGNOSIS — H5213 Myopia, bilateral: Secondary | ICD-10-CM | POA: Diagnosis not present

## 2024-04-06 DIAGNOSIS — H52223 Regular astigmatism, bilateral: Secondary | ICD-10-CM | POA: Diagnosis not present

## 2024-04-06 DIAGNOSIS — E871 Hypo-osmolality and hyponatremia: Secondary | ICD-10-CM | POA: Diagnosis not present

## 2024-04-06 DIAGNOSIS — H524 Presbyopia: Secondary | ICD-10-CM | POA: Diagnosis not present

## 2024-04-06 DIAGNOSIS — H02834 Dermatochalasis of left upper eyelid: Secondary | ICD-10-CM | POA: Diagnosis not present

## 2024-04-06 MED ORDER — LISINOPRIL 30 MG PO TABS: 30.0000 mg | ORAL_TABLET | Freq: Every day | ORAL | 1 refills | Status: DC

## 2024-04-06 NOTE — Progress Notes (Signed)
 Subjective:  Patient ID: [name], female    DOB: 20-Jul-1946  Age: 78 y.o. MRN: [id number]  CC: 6-8 WK FOLLOW UP   HPI  Discussed the use of AI scribe software for clinical note transcription with the patient, who gave verbal consent to proceed.  History of Present Illness   Mildred Tuccillo Wilbert [name] is a 78 year old female with hypothyroidism who presents for a follow-up visit regarding her thyroid  medication adjustment.  Her TSH levels were previously low, but recent blood work shows TSH levels have returned to normal. She reports no adverse symptoms such as heart palpitations, diarrhea, hair loss, or nervousness. No significant changes in energy levels or temperature sensitivity.  She experiences occasional constipation, which she manages with a high-fiber diet. She has not needed to adjust her thyroid  medication for this issue.  She recently visited her optometrist, who questioned her about her blood sugar levels after noticing something during the eye exam. Her last blood sugar test in June showed a level of 99, consistent with her usual readings in the 90s.  She has a history of low sodium levels. She does not experience noticeable swelling, although her legs sometimes feel swollen. She is currently taking lisinopril  with hydrochlorothiazide . She continues to take amlodipine  without any changes.             04/06/2024   11:10 AM 02/01/2024    8:21 AM 07/30/2023   10:09 AM  Depression screen PHQ 2/9  Decreased Interest 0 0 0  Down, Depressed, Hopeless 0 0 0  PHQ - 2 Score 0 0 0  Altered sleeping  0 0  Tired, decreased energy  2 0  Change in appetite  0 0  Feeling bad or failure about yourself   0 0  Trouble concentrating  0 0  Moving slowly or fidgety/restless  0 0  Suicidal thoughts  0 0  PHQ-9 Score  2 0  Difficult doing work/chores  Not difficult at all Not difficult at all    History [name] has a past medical history of Allergy, Asthma, Colon polyps, GERD  (gastroesophageal reflux disease), Hyperlipidemia, Hypertension, Menopausal state, Osteopenia, Rhinitis, Thyroid  disease, and Vitamin D  deficiency disease.   She has a past surgical history that includes Tonsillectomy; Cholecystectomy (2005); and Colonoscopy (2099).   Her family history includes AAA (abdominal aortic aneurysm) in her mother; Asthma in her brother; Colon cancer (age of onset: 7) in her mother; Colon polyps in her mother; Gout in her sister; Heart attack in her father; Heart disease in her father; Hyperlipidemia in her mother; Hypertension in her brother, mother, and sister; Osteoporosis in her mother; Thyroid  disease in her mother.She reports that she has never smoked. She has never used smokeless tobacco. She reports that she does not drink alcohol and does not use drugs.    ROS Review of Systems  Constitutional: Negative.   HENT: Negative.    Eyes:  Negative for visual disturbance.  Respiratory:  Negative for shortness of breath.   Cardiovascular:  Negative for chest pain.  Gastrointestinal:  Negative for abdominal pain.  Musculoskeletal:  Negative for arthralgias.    Objective:  BP 123/62   Pulse (!) 107   Temp (!) 97.5 F (36.4 C)   Ht 5' 1 (1.549 m)   Wt 147 lb 3.2 oz (66.8 kg)   SpO2 99%   BMI 27.81 kg/m   BP Readings from Last 3 Encounters:  04/06/24 123/62  02/01/24 130/68  07/30/23 129/64  Wt Readings from Last 3 Encounters:  04/06/24 147 lb 3.2 oz (66.8 kg)  02/01/24 146 lb (66.2 kg)  07/30/23 147 lb 6.4 oz (66.9 kg)     Physical Exam Constitutional:      General: She is not in acute distress.    Appearance: She is well-developed.  Cardiovascular:     Rate and Rhythm: Normal rate and regular rhythm.  Pulmonary:     Breath sounds: Normal breath sounds.  Musculoskeletal:        General: Normal range of motion.  Skin:    General: Skin is warm and dry.  Neurological:     Mental Status: She is alert and oriented to person, place, and  time.      Assessment & Plan:  Hypothyroidism, unspecified type  Hyponatremia  Essential hypertension  Other orders -     Lisinopril ; Take 1 tablet (30 mg total) by mouth daily.  Dispense: 90 tablet; Refill: 1    Assessment and Plan    Hypertension Low sodium levels necessitate medication adjustment. No significant swelling reported. - Discontinue lisinopril  hydrochlorothiazide . - Prescribe lisinopril  30 mg. - Continue amlodipine . - Monitor for swelling, advise feet elevation if swelling occurs.  Hyponatremia Low sodium likely due to lisinopril  hydrochlorothiazide . No kidney issues. - Discontinue lisinopril  hydrochlorothiazide . - Prescribe lisinopril  30 mg to normalize sodium levels.  Hypothyroidism TSH and Free T4 levels normal. No symptoms of excessive thyroid  medication. - Continue current thyroid  medication dosage.  Constipation Intermittent constipation managed with dietary fiber. - Increase dietary fiber intake. - Use Miralax as needed, up to twice daily. - Discuss bowel habits if long-term Miralax use is needed.  Diabetes Screening Blood sugar levels normal in June. No immediate diabetes concerns. - Continue routine monitoring of blood sugar levels during regular blood work.          Follow-up: Return in about 3 months (around 07/07/2024).  Butler Der, M.D.

## 2024-04-29 ENCOUNTER — Other Ambulatory Visit: Payer: Self-pay | Admitting: Family Medicine

## 2024-04-29 DIAGNOSIS — E039 Hypothyroidism, unspecified: Secondary | ICD-10-CM

## 2024-05-24 ENCOUNTER — Ambulatory Visit

## 2024-05-24 VITALS — BP 123/62 | HR 107 | Ht 61.0 in | Wt 147.0 lb

## 2024-05-24 DIAGNOSIS — Z0000 Encounter for general adult medical examination without abnormal findings: Secondary | ICD-10-CM

## 2024-05-24 DIAGNOSIS — Z1231 Encounter for screening mammogram for malignant neoplasm of breast: Secondary | ICD-10-CM

## 2024-05-24 NOTE — Patient Instructions (Signed)
 [name],  Thank you for taking the time for your Medicare Wellness Visit. I appreciate your continued commitment to your health goals. Please review the care plan we discussed, and feel free to reach out if I can assist you further.  Medicare recommends these wellness visits once per year to help you and your care team stay ahead of potential health issues. These visits are designed to focus on prevention, allowing your provider to concentrate on managing your acute and chronic conditions during your regular appointments.  Please note that Annual Wellness Visits do not include a physical exam. Some assessments may be limited, especially if the visit was conducted virtually. If needed, we may recommend a separate in-person follow-up with your provider.  Ongoing Care Seeing your primary care provider every 3 to 6 months helps us  monitor your health and provide consistent, personalized care.   Referrals If a referral was made during today's visit and you haven't received any updates within two weeks, please contact the referred provider directly to check on the status.  Recommended Screenings:  Health Maintenance  Topic Date Due   Medicare Annual Wellness Visit  09/04/2023   COVID-19 Vaccine (4 - 2025-26 season) 04/25/2024   Breast Cancer Screening  05/11/2024   Flu Shot  11/22/2024*   DEXA scan (bone density measurement)  02/01/2026   DTaP/Tdap/Td vaccine (3 - Td or Tdap) 12/24/2027   Pneumococcal Vaccine for age over 87  Completed   Zoster (Shingles) Vaccine  Completed   HPV Vaccine  Aged Out   Meningitis B Vaccine  Aged Out   Colon Cancer Screening  Discontinued   Hepatitis C Screening  Discontinued  *Topic was postponed. The date shown is not the original due date.       05/24/2024   10:12 AM  Advanced Directives  Does Patient Have a Medical Advance Directive? No   Advance Care Planning is important because it: Ensures you receive medical care that aligns with your values,  goals, and preferences. Provides guidance to your family and loved ones, reducing the emotional burden of decision-making during critical moments.  Vision: Annual vision screenings are recommended for early detection of glaucoma, cataracts, and diabetic retinopathy. These exams can also reveal signs of chronic conditions such as diabetes and high blood pressure.  Dental: Annual dental screenings help detect early signs of oral cancer, gum disease, and other conditions linked to overall health, including heart disease and diabetes.  Please see the attached documents for additional preventive care recommendations.

## 2024-05-24 NOTE — Progress Notes (Signed)
 Subjective:   [name] is a 78 y.o. who presents for a Medicare Wellness preventive visit.  As a reminder, Annual Wellness Visits don't include a physical exam, and some assessments may be limited, especially if this visit is performed virtually. We may recommend an in-person follow-up visit with your provider if needed.  Visit Complete: Virtual I connected with  Foy SHAUNNA Lee on 05/24/24 by a audio enabled telemedicine application and verified that I am speaking with the correct person using two identifiers.  Patient Location: Home  Provider Location: Home Office  I discussed the limitations of evaluation and management by telemedicine. The patient expressed understanding and agreed to proceed.  Vital Signs: Because this visit was a virtual/telehealth visit, some criteria may be missing or patient reported. Any vitals not documented were not able to be obtained and vitals that have been documented are patient reported.  VideoDeclined- This patient declined Librarian, academic. Therefore the visit was completed with audio only.  Persons Participating in Visit: Patient.  AWV Questionnaire: No: Patient Medicare AWV questionnaire was not completed prior to this visit.  Cardiac Risk Factors include: advanced age (>29men, >47 women);dyslipidemia;smoking/ tobacco exposure;family history of premature cardiovascular disease     Objective:    Today's Vitals   05/24/24 1007  BP: 123/62  Pulse: (!) 107  Weight: 147 lb (66.7 kg)  Height: 5' 1 (1.549 m)   Body mass index is 27.78 kg/m.     05/24/2024   10:12 AM 09/02/2021    8:20 AM 07/03/2021    1:17 PM 06/22/2021   12:33 PM 08/31/2020    8:29 AM 08/09/2020    5:22 PM 03/10/2019    2:10 PM  Advanced Directives  Does Patient Have a Medical Advance Directive? No No No No No No No  Would patient like information on creating a medical advance directive?  No - Patient declined  No - Patient declined No -  Patient declined  Yes (MAU/Ambulatory/Procedural Areas - Information given)      Data saved with a previous flowsheet row definition    Current Medications (verified) Outpatient Encounter Medications as of 05/24/2024  Medication Sig   albuterol  (VENTOLIN  HFA) 108 (90 Base) MCG/ACT inhaler Inhale 2 puffs into the lungs every 6 (six) hours as needed for wheezing.   amLODipine  (NORVASC ) 5 MG tablet Take 1 tablet (5 mg total) by mouth daily.   aspirin  EC 81 MG tablet Take 81 mg by mouth daily. Swallow whole.   atorvastatin  (LIPITOR) 40 MG tablet Take 1 tablet (40 mg total) by mouth daily.   cetirizine  (ZYRTEC ) 5 MG tablet Take 1 tablet (5 mg total) by mouth daily.   Cholecalciferol (VITAMIN D3) 3000 UNITS TABS Take 2,000 Units by mouth daily.   fluticasone  (FLONASE ) 50 MCG/ACT nasal spray Use 2 spray(s) in each nostril once daily   fluticasone  furoate-vilanterol (BREO ELLIPTA ) 200-25 MCG/ACT AEPB Inhale 1 puff into the lungs daily.   ibandronate  (BONIVA ) 150 MG tablet Take 1 tablet (150 mg total) by mouth every 30 (thirty) days. Take in the morning with a full glass of water, on an empty stomach, and do not take anything else by mouth or lie down for the next 30 min.   levothyroxine  (SYNTHROID ) 50 MCG tablet Take 1 tablet by mouth once daily   lisinopril  (ZESTRIL ) 30 MG tablet Take 1 tablet (30 mg total) by mouth daily.   Multiple Vitamin (MULTIVITAMIN WITH MINERALS) TABS Take 1 tablet by mouth daily.  pantoprazole  (PROTONIX ) 40 MG tablet Take 1 tablet (40 mg total) by mouth daily. For stomach   No facility-administered encounter medications on file as of 05/24/2024.    Allergies (verified) Evista  [raloxifene  hcl], Fosamax  [alendronate  sodium], Red dye #40 (allura red), and Sulfa antibiotics   History: Past Medical History:  Diagnosis Date   Allergy    seasonal allergies   Asthma    uses inhaler    Colon polyps    GERD (gastroesophageal reflux disease)    with certain foods    Hyperlipidemia    on meds   Hypertension    on meds   Menopausal state    Osteopenia    Rhinitis    Thyroid  disease    on meds   Vitamin D  deficiency disease    Past Surgical History:  Procedure Laterality Date   CHOLECYSTECTOMY  2005   COLONOSCOPY  2099   CG-F/V-miralax(exc)-TA   TONSILLECTOMY     Family History  Problem Relation Age of Onset   Hyperlipidemia Mother    Hypertension Mother    Thyroid  disease Mother    Osteoporosis Mother    Colon polyps Mother    Colon cancer Mother 87   AAA (abdominal aortic aneurysm) Mother    Heart disease Father    Heart attack Father        heavy smoker   Hypertension Sister    Gout Sister    Hypertension Brother    Asthma Brother    Esophageal cancer Neg Hx    Stomach cancer Neg Hx    Breast cancer Neg Hx    Social History   Socioeconomic History   Marital status: Single    Spouse name: Not on file   Number of children: 0   Years of education: 12   Highest education level: 12th grade  Occupational History   Occupation: retired    Associate Professor: [employer]    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 Never married and no children. Retired from [employer] in the [employer].    Social Drivers of Corporate investment banker Strain: Low Risk  (05/24/2024)   Overall Financial Resource Strain (CARDIA)    Difficulty of Paying Living Expenses: Not very hard  Food Insecurity: No Food Insecurity (05/24/2024)   Hunger Vital Sign    Worried About Running Out of Food in the Last Year: Never true    Ran Out of Food in the Last Year: Never true  Transportation Needs: No Transportation Needs (05/24/2024)   PRAPARE -  Administrator, Civil Service (Medical): No    Lack of Transportation (Non-Medical): No  Physical Activity: Inactive (05/24/2024)   Exercise Vital Sign    Days of Exercise per Week: 0 days    Minutes of Exercise per Session: 0 min  Stress: No Stress Concern Present (05/24/2024)   [employer] of Occupational Health - Occupational Stress Questionnaire    Feeling of Stress: Not at all  Social Connections: Moderately Integrated (05/24/2024)   Social Connection and Isolation Panel    Frequency of Communication with Friends and Family: More than three times a week  Frequency of Social Gatherings with Friends and Family: Three times a week    Attends Religious Services: More than 4 times per year    Active Member of Clubs or Organizations: Yes    Attends Engineer, structural: More than 4 times per year    Marital Status: Never married    Tobacco Counseling Counseling given: Yes    Clinical Intake:  Pre-visit preparation completed: Yes  Pain : No/denies pain     BMI - recorded: 27.78 Nutritional Status: BMI 25 -29 Overweight Nutritional Risks: None Diabetes: No  Lab Results  Component Value Date   HGBA1C 5.1 06/23/2021     How often do you need to have someone help you when you read instructions, pamphlets, or other written materials from your doctor or pharmacy?: 1 - Never  Interpreter Needed?: No  Information entered by :: alia t/cma   Activities of Daily Living     05/24/2024   10:10 AM  In your present state of health, do you have any difficulty performing the following activities:  Hearing? 0  Vision? 0  Difficulty concentrating or making decisions? 0  Walking or climbing stairs? 0  Dressing or bathing? 0  Doing errands, shopping? 0  Preparing Food and eating ? N  Using the Toilet? N  In the past six months, have you accidently leaked urine? N  Do you have problems with loss of bowel control? N  Managing your Medications? N   Managing your Finances? N  Housekeeping or managing your Housekeeping? N    Patient Care Team: Zollie Lowers, MD as PCP - General (Family Medicine) Charls Pearla LABOR, MD (Inactive) as PCP - Cardiology (Cardiology) Vicci Mcardle, OD (Optometry)  I have updated your Care Teams any recent Medical Services you may have received from other providers in the past year.     Assessment:   This is a routine wellness examination for Teliah.  Hearing/Vision screen Hearing Screening - Comments:: Pt denies hearing dif Vision Screening - Comments:: Pt wear glasses/pt goes [facility] in [place]/ last ov 03/2024   Goals Addressed             This Visit's Progress    AWV   On track    08/31/2020 AWV Goal: Exercise for General Health  Patient will verbalize understanding of the benefits of increased physical activity: Exercising regularly is important. It will improve your overall fitness, flexibility, and endurance. Regular exercise also will improve your overall health. It can help you control your weight, reduce stress, and improve your bone density. Over the next year, patient will increase physical activity as tolerated with a goal of at least 150 minutes of moderate physical activity per week.  You can tell that you are exercising at a moderate intensity if your heart starts beating faster and you start breathing faster but can still hold a conversation. Moderate-intensity exercise ideas include: Walking 1 mile (1.6 km) in about 15 minutes Biking Hiking Golfing Dancing Water aerobics Patient will verbalize understanding of everyday activities that increase physical activity by providing examples like the following: Yard work, such as: Insurance underwriter Gardening Washing windows or floors Patient will be able to explain general safety guidelines for exercising:  Before you start a new exercise  program, talk with your health care provider. Do not exercise so much that you hurt yourself, feel dizzy, or get very short of breath. Wear comfortable  clothes and wear shoes with good support. Drink plenty of water while you exercise to prevent dehydration or heat stroke. Work out until your breathing and your heartbeat get faster.        Depression Screen     05/24/2024   10:13 AM 04/06/2024   11:10 AM 02/01/2024    8:21 AM 07/30/2023   10:09 AM 04/30/2023    9:31 AM 03/30/2023    8:32 AM 03/30/2023    8:27 AM  PHQ 2/9 Scores  PHQ - 2 Score 0 0 0 0 0 0 0  PHQ- 9 Score   2 0  2     Fall Risk     05/24/2024   10:09 AM 04/06/2024   11:10 AM 07/30/2023   10:10 AM 04/30/2023    9:39 AM 04/30/2023    9:31 AM  Fall Risk   Falls in the past year? 0 0 0 0 0  Number falls in past yr: 0      Injury with Fall? 0      Risk for fall due to : No Fall Risks      Follow up Falls evaluation completed        MEDICARE RISK AT HOME:  Medicare Risk at Home Any stairs in or around the home?: Yes If so, are there any without handrails?: Yes Home free of loose throw rugs in walkways, pet beds, electrical cords, etc?: Yes Adequate lighting in your home to reduce risk of falls?: Yes Life alert?: No Use of a cane, walker or w/c?: No Grab bars in the bathroom?: Yes Shower chair or bench in shower?: No Elevated toilet seat or a handicapped toilet?: Yes  TIMED UP AND GO:  Was the test performed?  no  Cognitive Function: 6CIT completed    10/26/2017    3:36 PM 07/02/2016    2:57 PM 03/09/2015    9:53 AM  MMSE - Mini Mental State Exam  Orientation to time 5 5  5    Orientation to Place 5  5   Registration 3 3  3    Attention/ Calculation 5 5  5    Recall 3 2  3    Language- name 2 objects 2 2  2    Language- repeat 1 1 1   Language- follow 3 step command 3 3  3    Language- read & follow direction 1 1  1    Write a sentence 1 1  1    Copy design 1 0  0   Total score 30  29      Data saved with a  previous flowsheet row definition        05/24/2024   10:12 AM 09/03/2022   11:23 AM 09/02/2021    8:25 AM 08/31/2020    8:30 AM 03/10/2019    2:12 PM  6CIT Screen  What Year? 0 points 0 points 0 points 0 points 0 points  What month? 0 points 0 points 0 points 0 points 0 points  What time? 0 points 0 points 0 points 0 points 0 points  Count back from 20 0 points 0 points 0 points 0 points 0 points  Months in reverse 0 points 0 points 0 points 0 points 0 points  Repeat phrase 2 points 0 points 0 points 2 points 0 points  Total Score 2 points 0 points 0 points 2 points 0 points    Immunizations Immunization History  Administered Date(s) Administered   Fluad Quad(high Dose 65+) 06/03/2019, 05/22/2020, 05/30/2021, 05/22/2022  Fluad Trivalent(High Dose 65+) 06/10/2023   INFLUENZA, HIGH DOSE SEASONAL PF 06/16/2017, 06/04/2018   Influenza,inj,Quad PF,6+ Mos 06/10/2013, 06/05/2014, 06/05/2015, 05/26/2016   PFIZER(Purple Top)SARS-COV-2 Vaccination 11/30/2019, 12/23/2019, 07/23/2020   Pneumococcal Conjugate-13 12/26/2014   Pneumococcal Polysaccharide-23 11/22/2012   Td 09/30/2007   Tdap 12/23/2017   Zoster Recombinant(Shingrix) 03/27/2021, 09/26/2021    Screening Tests Health Maintenance  Topic Date Due   COVID-19 Vaccine (4 - 2025-26 season) 04/25/2024   Mammogram  05/11/2024   Influenza Vaccine  11/22/2024 (Originally 03/25/2024)   Medicare Annual Wellness (AWV)  05/24/2025   DEXA SCAN  02/01/2026   DTaP/Tdap/Td (3 - Td or Tdap) 12/24/2027   Pneumococcal Vaccine: 50+ Years  Completed   Zoster Vaccines- Shingrix  Completed   HPV VACCINES  Aged Out   Meningococcal B Vaccine  Aged Out   Colonoscopy  Discontinued   Hepatitis C Screening  Discontinued    Health Maintenance Items Addressed: Mammogram ordered  Additional Screening:  Vision Screening: Recommended annual ophthalmology exams for early detection of glaucoma and other disorders of the eye. Is the patient up to date with  their annual eye exam?  Yes  Who is the provider or what is the name of the office in which the patient attends annual eye exams? [facility]  Dental Screening: Recommended annual dental exams for proper oral hygiene  Community Resource Referral / Chronic Care Management: CRR required this visit?  No   CCM required this visit?  No   Plan:    I have personally reviewed and noted the following in the patient's chart:   Medical and social history Use of alcohol, tobacco or illicit drugs  Current medications and supplements including opioid prescriptions. Patient is not currently taking opioid prescriptions. Functional ability and status Nutritional status Physical activity Advanced directives List of other physicians Hospitalizations, surgeries, and ER visits in previous 12 months Vitals Screenings to include cognitive, depression, and falls Referrals and appointments  In addition, I have reviewed and discussed with patient certain preventive protocols, quality metrics, and best practice recommendations. A written personalized care plan for preventive services as well as general preventive health recommendations were provided to patient.   Ozie Ned, CMA   05/24/2024   After Visit Summary: (MyChart) Due to this being a telephonic visit, the after visit summary with patients personalized plan was offered to patient via MyChart   Notes: Nothing significant to report at this time.

## 2024-06-24 ENCOUNTER — Ambulatory Visit (INDEPENDENT_AMBULATORY_CARE_PROVIDER_SITE_OTHER): Admitting: *Deleted

## 2024-06-24 DIAGNOSIS — Z23 Encounter for immunization: Secondary | ICD-10-CM

## 2024-08-01 ENCOUNTER — Other Ambulatory Visit: Payer: Self-pay | Admitting: *Deleted

## 2024-08-01 MED ORDER — FLUTICASONE FUROATE-VILANTEROL 200-25 MCG/ACT IN AEPB: 1.0000 | INHALATION_SPRAY | Freq: Every day | RESPIRATORY_TRACT | 0 refills | Status: DC

## 2024-08-02 ENCOUNTER — Ambulatory Visit: Payer: Self-pay | Admitting: Family Medicine

## 2024-08-09 ENCOUNTER — Encounter: Payer: Self-pay | Admitting: Family Medicine

## 2024-08-09 ENCOUNTER — Ambulatory Visit: Admitting: Family Medicine

## 2024-08-09 VITALS — BP 142/75 | HR 104 | Temp 97.3°F | Wt 147.0 lb

## 2024-08-09 DIAGNOSIS — J4540 Moderate persistent asthma, uncomplicated: Secondary | ICD-10-CM

## 2024-08-09 DIAGNOSIS — E039 Hypothyroidism, unspecified: Secondary | ICD-10-CM

## 2024-08-09 DIAGNOSIS — E871 Hypo-osmolality and hyponatremia: Secondary | ICD-10-CM | POA: Diagnosis not present

## 2024-08-09 DIAGNOSIS — I10 Essential (primary) hypertension: Secondary | ICD-10-CM | POA: Diagnosis not present

## 2024-08-09 DIAGNOSIS — M8589 Other specified disorders of bone density and structure, multiple sites: Secondary | ICD-10-CM

## 2024-08-09 DIAGNOSIS — E7800 Pure hypercholesterolemia, unspecified: Secondary | ICD-10-CM

## 2024-08-09 MED ORDER — FLUTICASONE FUROATE-VILANTEROL 200-25 MCG/ACT IN AEPB: 1.0000 | INHALATION_SPRAY | Freq: Every day | RESPIRATORY_TRACT | 3 refills | Status: AC

## 2024-08-09 NOTE — Progress Notes (Signed)
 Subjective:  Patient ID: [name], female    DOB: 1946-06-18  Age: 78 y.o. MRN: [id number]  CC: Medical Management of Chronic Issues (No concerns)   HPI  Discussed the use of AI scribe software for clinical note transcription with the patient, who gave verbal consent to proceed.  History of Present Illness Chastity Noland Devota Viruet is an 78 year old female who presents for a routine follow-up.  She experiences a sensation of feeling cold, particularly when outside, but notes that she does not go out much.  She experiences occasional gastrointestinal discomfort, particularly after consuming certain foods or drinks like soda, which she usually avoids. Foods like baked potatoes, stew, potato soup, oatmeal, and toast are more agreeable to her stomach. No recent stomach problems or swelling.  She is currently using Breo for her breathing and is breathing well without needing her rescue inhaler, albuterol . She recently had an issue with her Breo prescription being filled for only 30 days instead of her usual 35-month supply.  She is taking Boniva  for her bones and is due to take her next dose on Saturday. She is also on atorvastatin  for cholesterol management and pantoprazole  for reflux, both of which she took today. Her thyroid  was checked three months ago and she recalls that it was previously high, but the last result was in range.  She recalls being told that her sodium levels have been low.          08/09/2024   11:32 AM 05/24/2024   10:13 AM 04/06/2024   11:10 AM  Depression screen PHQ 2/9  Decreased Interest 0 0 0  Down, Depressed, Hopeless 0 0 0  PHQ - 2 Score 0 0 0  Altered sleeping 0    Tired, decreased energy 0    Change in appetite 0    Feeling bad or failure about yourself  0    Trouble concentrating 0    Moving slowly or fidgety/restless 0    Suicidal thoughts 0    PHQ-9 Score 0    Difficult doing work/chores Not difficult at all      History Amantha has a  past medical history of Allergy, Asthma, Colon polyps, GERD (gastroesophageal reflux disease), Hyperlipidemia, Hypertension, Menopausal state, Osteopenia, Rhinitis, Thyroid  disease, and Vitamin D  deficiency disease.   She has a past surgical history that includes Tonsillectomy; Cholecystectomy (2005); and Colonoscopy (2099).   Her family history includes AAA (abdominal aortic aneurysm) in her mother; Asthma in her brother; Colon cancer (age of onset: 63) in her mother; Colon polyps in her mother; Gout in her sister; Heart attack in her father; Heart disease in her father; Hyperlipidemia in her mother; Hypertension in her brother, mother, and sister; Osteoporosis in her mother; Thyroid  disease in her mother.She reports that she has never smoked. She has never used smokeless tobacco. She reports that she does not drink alcohol and does not use drugs.    ROS Review of Systems  Constitutional: Negative.   HENT:  Negative for congestion.   Eyes:  Negative for visual disturbance.  Respiratory:  Negative for shortness of breath.   Cardiovascular:  Negative for chest pain.  Gastrointestinal:  Negative for abdominal pain, constipation, diarrhea, nausea and vomiting.  Genitourinary:  Negative for difficulty urinating.  Musculoskeletal:  Negative for arthralgias and myalgias.  Neurological:  Negative for headaches.  Psychiatric/Behavioral:  Negative for sleep disturbance.     Objective:  BP (!) 142/75   Pulse (!) 104   Temp (!)  97.3 F (36.3 C)   Wt 147 lb (66.7 kg)   SpO2 98%   BMI 27.78 kg/m   BP Readings from Last 3 Encounters:  08/09/24 (!) 142/75  05/24/24 123/62  04/06/24 123/62    Wt Readings from Last 3 Encounters:  08/09/24 147 lb (66.7 kg)  05/24/24 147 lb (66.7 kg)  04/06/24 147 lb 3.2 oz (66.8 kg)     Physical Exam Physical Exam MEASUREMENTS: Weight- 144. GENERAL: Alert, cooperative, well developed, no acute distress. HEENT: Normocephalic, normal oropharynx, moist  mucous membranes. CHEST: Clear to auscultation bilaterally, no wheezes, rhonchi, or crackles. CARDIOVASCULAR: Normal heart rate and rhythm, S1 and S2 normal without murmurs. ABDOMEN: Soft, non-tender, non-distended, without organomegaly, normal bowel sounds. EXTREMITIES: No cyanosis or edema. NEUROLOGICAL: Cranial nerves grossly intact, moves all extremities without gross motor or sensory deficit.   Assessment & Plan:  Essential hypertension -     BMP8+EGFR  Hypothyroidism, unspecified type -     TSH + free T4  Pure hypercholesterolemia  Asthma, chronic, moderate persistent, uncomplicated  Osteopenia of multiple sites  Hyponatremia -     BMP8+EGFR  Other orders -     Fluticasone  Furoate-Vilanterol; Inhale 1 puff into the lungs daily.  Dispense: 84 each; Refill: 3    Assessment and Plan Assessment & Plan Hypothyroidism   Thyroid  function was stable and within normal range at the last test three months ago, despite previously elevated levels. Ordered a thyroid  function test.  Essential hypertension   Blood pressure was slightly elevated today. Advised to reduce sodium intake, especially from packaged foods.  Hyponatremia   Discussed the challenge of increasing sodium intake due to blood pressure concerns. Ordered a sodium level test and will evaluate medication regimen if sodium remains low.  Moderate persistent asthma   Asthma is well-controlled with Breo, with no recent use of the rescue inhaler. Prescribed Breo with three inhalers for 84 days to reduce pharmacy visits.  Pure hypercholesterolemia   Continues atorvastatin  for cholesterol management.  Osteopenia   Continues Boniva  for bone health, with the next dose due on Saturday.  General health maintenance   Discussed dietary habits and weight management, encouraging a healthy diet and avoidance of junk food. Scheduled a follow-up in six months for comprehensive physical.       Follow-up: Return in about 6  months (around 02/07/2025) for [facility] physical.  Butler Der, M.D.

## 2024-08-10 LAB — BMP8+EGFR
BUN/Creatinine Ratio: 9 — ABNORMAL LOW (ref 12–28)
BUN: 9 mg/dL (ref 8–27)
CO2: 21 mmol/L (ref 20–29)
Calcium: 11.2 mg/dL — ABNORMAL HIGH (ref 8.7–10.3)
Chloride: 101 mmol/L (ref 96–106)
Creatinine, Ser: 1.03 mg/dL — ABNORMAL HIGH (ref 0.57–1.00)
Glucose: 92 mg/dL (ref 70–99)
Potassium: 4.3 mmol/L (ref 3.5–5.2)
Sodium: 138 mmol/L (ref 134–144)
eGFR: 55 mL/min/1.73 — ABNORMAL LOW (ref >59)

## 2024-08-10 LAB — TSH+FREE T4
Free T4: 1.45 ng/dL (ref 0.82–1.77)
TSH: 2.62 u[IU]/mL (ref 0.450–4.500)

## 2024-08-14 ENCOUNTER — Ambulatory Visit: Payer: Self-pay | Admitting: Family Medicine

## 2024-08-14 NOTE — Progress Notes (Signed)
Hello [name],  Your lab result is normal and/or stable.Some minor variations that are not significant are commonly marked abnormal, but do not represent any medical problem for you.  Best regards, Mechele Claude, M.D.

## 2024-09-28 ENCOUNTER — Other Ambulatory Visit: Payer: Self-pay | Admitting: Family Medicine

## 2025-05-25 ENCOUNTER — Ambulatory Visit: Payer: Self-pay
